# Patient Record
Sex: Male | Born: 1955 | Race: White | Hispanic: No | Marital: Married | Smoking: Never smoker
Health system: Southern US, Community
[De-identification: ages and names within clinical notes are randomized; demographics above are authoritative.]

## PROBLEM LIST (undated history)

## (undated) DIAGNOSIS — I10 Essential (primary) hypertension: Secondary | ICD-10-CM

## (undated) DIAGNOSIS — C44601 Unspecified malignant neoplasm of skin of unspecified upper limb, including shoulder: Secondary | ICD-10-CM

## (undated) DIAGNOSIS — K219 Gastro-esophageal reflux disease without esophagitis: Secondary | ICD-10-CM

## (undated) DIAGNOSIS — E119 Type 2 diabetes mellitus without complications: Secondary | ICD-10-CM

## (undated) DIAGNOSIS — Z955 Presence of coronary angioplasty implant and graft: Secondary | ICD-10-CM

## (undated) DIAGNOSIS — J309 Allergic rhinitis, unspecified: Secondary | ICD-10-CM

## (undated) DIAGNOSIS — E7800 Pure hypercholesterolemia, unspecified: Secondary | ICD-10-CM

## (undated) DIAGNOSIS — T7840XA Allergy, unspecified, initial encounter: Secondary | ICD-10-CM

## (undated) DIAGNOSIS — E785 Hyperlipidemia, unspecified: Secondary | ICD-10-CM

## (undated) DIAGNOSIS — I2510 Atherosclerotic heart disease of native coronary artery without angina pectoris: Secondary | ICD-10-CM

## (undated) DIAGNOSIS — H269 Unspecified cataract: Secondary | ICD-10-CM

## (undated) DIAGNOSIS — I340 Nonrheumatic mitral (valve) insufficiency: Secondary | ICD-10-CM

## (undated) DIAGNOSIS — E559 Vitamin D deficiency, unspecified: Secondary | ICD-10-CM

## (undated) HISTORY — DX: Presence of coronary angioplasty implant and graft: Z95.5

## (undated) HISTORY — DX: Gastro-esophageal reflux disease without esophagitis: K21.9

## (undated) HISTORY — DX: Essential (primary) hypertension: I10

## (undated) HISTORY — DX: Unspecified malignant neoplasm of skin of unspecified upper limb, including shoulder: C44.601

## (undated) HISTORY — DX: Type 2 diabetes mellitus without complications (CMS HCC): E11.9

## (undated) HISTORY — PX: HX VASECTOMY: SHX75

## (undated) HISTORY — PX: CORONARY STENT PLACEMENT: SHX1402

## (undated) HISTORY — DX: Atherosclerotic heart disease of native coronary artery without angina pectoris: I25.10

## (undated) HISTORY — PX: VASECTOMY: SHX75

## (undated) HISTORY — DX: Pure hypercholesterolemia, unspecified: E78.00

## (undated) HISTORY — DX: Unspecified cataract: H26.9

## (undated) HISTORY — DX: Type 2 diabetes mellitus without complications: E11.9

## (undated) HISTORY — DX: Allergic rhinitis, unspecified: J30.9

## (undated) HISTORY — PX: HAMMER TOE SURGERY: SHX385

## (undated) HISTORY — PX: POLYPECTOMY: SHX149

## (undated) HISTORY — DX: Hyperlipidemia, unspecified: E78.5

## (undated) HISTORY — DX: Vitamin D deficiency, unspecified: E55.9

## (undated) HISTORY — DX: Allergy, unspecified, initial encounter: T78.40XA

---
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
## 2000-05-04 ENCOUNTER — Ambulatory Visit (HOSPITAL_COMMUNITY): Admission: RE | Admit: 2000-05-04 | Discharge: 2000-05-04 | Payer: Self-pay | Admitting: Family Medicine

## 2001-09-25 ENCOUNTER — Encounter: Payer: Self-pay | Admitting: Family Medicine

## 2001-09-25 ENCOUNTER — Encounter: Admission: RE | Admit: 2001-09-25 | Discharge: 2001-09-25 | Payer: Self-pay | Admitting: Family Medicine

## 2004-03-27 HISTORY — PX: COLONOSCOPY: SHX174

## 2005-08-02 DIAGNOSIS — E119 Type 2 diabetes mellitus without complications: Secondary | ICD-10-CM

## 2005-08-02 HISTORY — DX: Type 2 diabetes mellitus without complications: E11.9

## 2006-07-19 ENCOUNTER — Encounter: Admission: RE | Admit: 2006-07-19 | Discharge: 2006-10-17 | Payer: Self-pay | Admitting: Family Medicine

## 2007-02-20 ENCOUNTER — Encounter: Admission: RE | Admit: 2007-02-20 | Discharge: 2007-02-20 | Payer: Self-pay | Admitting: Family Medicine

## 2007-04-19 ENCOUNTER — Encounter: Admission: RE | Admit: 2007-04-19 | Discharge: 2007-04-19 | Payer: Self-pay | Admitting: Family Medicine

## 2007-04-20 ENCOUNTER — Encounter: Admission: RE | Admit: 2007-04-20 | Discharge: 2007-04-20 | Payer: Self-pay | Admitting: Family Medicine

## 2009-10-27 DIAGNOSIS — C4492 Squamous cell carcinoma of skin, unspecified: Secondary | ICD-10-CM

## 2009-10-27 HISTORY — DX: Squamous cell carcinoma of skin, unspecified: C44.92

## 2010-11-01 DIAGNOSIS — I2510 Atherosclerotic heart disease of native coronary artery without angina pectoris: Secondary | ICD-10-CM

## 2010-11-01 HISTORY — DX: Atherosclerotic heart disease of native coronary artery without angina pectoris: I25.10

## 2010-11-01 HISTORY — PX: CORONARY ANGIOPLASTY WITH STENT PLACEMENT: SHX49

## 2010-11-05 ENCOUNTER — Ambulatory Visit (HOSPITAL_COMMUNITY)
Admission: RE | Admit: 2010-11-05 | Discharge: 2010-11-06 | Disposition: A | Discharge status: Home / Self Care | Payer: PRIVATE HEALTH INSURANCE | Source: Ambulatory Visit | Attending: Interventional Cardiology | Admitting: Interventional Cardiology

## 2010-11-05 DIAGNOSIS — Z79899 Other long term (current) drug therapy: Secondary | ICD-10-CM | POA: Insufficient documentation

## 2010-11-05 DIAGNOSIS — K219 Gastro-esophageal reflux disease without esophagitis: Secondary | ICD-10-CM | POA: Insufficient documentation

## 2010-11-05 DIAGNOSIS — E7800 Pure hypercholesterolemia, unspecified: Secondary | ICD-10-CM | POA: Insufficient documentation

## 2010-11-05 DIAGNOSIS — E559 Vitamin D deficiency, unspecified: Secondary | ICD-10-CM | POA: Insufficient documentation

## 2010-11-05 DIAGNOSIS — I209 Angina pectoris, unspecified: Secondary | ICD-10-CM | POA: Insufficient documentation

## 2010-11-05 DIAGNOSIS — I10 Essential (primary) hypertension: Secondary | ICD-10-CM | POA: Insufficient documentation

## 2010-11-05 DIAGNOSIS — J309 Allergic rhinitis, unspecified: Secondary | ICD-10-CM | POA: Insufficient documentation

## 2010-11-05 DIAGNOSIS — E119 Type 2 diabetes mellitus without complications: Secondary | ICD-10-CM | POA: Insufficient documentation

## 2010-11-05 DIAGNOSIS — I2510 Atherosclerotic heart disease of native coronary artery without angina pectoris: Secondary | ICD-10-CM | POA: Insufficient documentation

## 2010-11-05 LAB — GLUCOSE, CAPILLARY
Glucose-Capillary: 195 mg/dL — ABNORMAL HIGH (ref 70–99)
Glucose-Capillary: 167 mg/dL — ABNORMAL HIGH (ref 70–99)
Glucose-Capillary: 204 mg/dL — ABNORMAL HIGH (ref 70–99)

## 2010-11-05 LAB — POCT ACTIVATED CLOTTING TIME: Activated Clotting Time: 658 s

## 2010-11-06 LAB — BASIC METABOLIC PANEL
BUN: 11 mg/dL (ref 6–23)
CO2: 27 mEq/L (ref 19–32)
Calcium: 9.1 mg/dL (ref 8.4–10.5)
Chloride: 104 mEq/L (ref 96–112)
Creatinine, Ser: 1.18 mg/dL (ref 0.4–1.5)
GFR calc Af Amer: >60 mL/min (ref >60)

## 2010-11-06 LAB — CBC
Hemoglobin: 15.6 g/dL (ref 13.0–17.0)
MCH: 29.3 pg (ref 26.0–34.0)
MCHC: 34.1 g/dL (ref 30.0–36.0)
MCV: 85.9 fL (ref 78.0–100.0)
Platelets: 156 10*3/uL (ref 150–400)
RBC: 5.32 MIL/uL (ref 4.22–5.81)

## 2010-11-18 NOTE — Discharge Summary (Signed)
  NAME:  Henry Rivera, BOEHNING NO.:  [id number]  MEDICAL RECORD NO.:  [id number]           PATIENT TYPE:  O  LOCATION:  6533                         FACILITY:  [facility]  PHYSICIAN:  Corky Crafts, MDDATE OF BIRTH:  Mar 07, 1956  DATE OF ADMISSION:  11/05/2010 DATE OF DISCHARGE:  11/06/2010                              DISCHARGE SUMMARY   FINAL DIAGNOSES: 1. Coronary artery disease. 2. Hypercholesterolemia. 3. Diabetes.  PROCEDURE PERFORMED:  Cardiac catheterization with drug-eluting stent implantation into the left anterior descending on November 05, 2010, by Dr. Verdis Prime.  HOSPITAL COURSE:  The patient underwent angioplasty of his LAD successfully with a drug-eluting Resolute stent placed into his LAD.  He tolerated the procedure well, was done from his right radial approach. There were no complications.  He was stable the following day and was deemed ready for discharge.  His sugar was somewhat elevated, and he was given a dose of Januvia since he could not take his Janumet.  DISCHARGE MEDICATIONS: 1. Aspirin 325 mg daily. 2. Sublingual nitroglycerin p.r.n. 3. Prasugrel 10 mg daily. 4. Rosuvastatin 20 mg daily. 5. Allegra p.r.n. 6. Benicar 20 mg daily. 7. Keflex 1 capsule b.i.d. 8. Janumet 50/1000 b.i.d., resume on November 07, 2010. 9. Nexium 40 mg daily. 10.Vitamin D.  Follow up with Dr. Katrinka Blazing in 7-10 days.  DIET:  Low-sodium, heart-healthy diet.  ACTIVITY:  Follow up post radial cath instructions, increase activity slowly.     Corky Crafts, MD     JSV/MEDQ  D:  11/06/2010  T:  11/07/2010  Job:  [id number]  Electronically Signed by Lance Muss MD on 11/18/2010 02:27:15 PM

## 2010-11-20 NOTE — Cardiovascular Report (Signed)
NAME:  Henry Rivera, Henry Rivera NO.:  [id number]  MEDICAL RECORD NO.:  [id number]           PATIENT TYPE:  O  LOCATION:  6533                         FACILITY:  [facility]  PHYSICIAN:  Lyn Records, M.D.   DATE OF BIRTH:  [birth year]  DATE OF PROCEDURE:  11/05/2010 DATE OF DISCHARGE:                           CARDIAC CATHETERIZATION   INDICATION:  Class II/III angina with abnormal Cardiolite study suggesting anterior ischemia.  PROCEDURES PERFORMED: 1. Left heart cath via right radial approach. 2. Left ventriculography. 3. Coronary angiography. 4. Drug-eluting stent, Resolute, LAD.  DESCRIPTION:  The patient had given fully informed consent prior to coming to the cath lab.  We preferred to use a right radial approach. He was sterilely prepped and draped.  A 5-French sheath was placed without difficulty using a micro puncture set.  Verapamil was then given intra-arterially.  5000 units of heparin was then administered.  We performed left heart catheterization using a 5-French A2 multipurpose catheter.  We were able to cannulate the right coronary and do the left ventriculography with the multipurpose.  A 5-French 3.5 left Judkins catheter was used for left coronary angiography.  After identifying high- grade disease in the LAD, we decided to proceed with stenting.  We stayed with a 5-French system.  We used a 3.5 EBU left coronary system guide catheter.  We gave a bolus followed by an infusion of Angiomax.  60 mg of Effient was given.  We used a BMW wire to direct stent the proximal LAD lesion with a 3.5 x 18-mm long Resolute stent. We deployed the stent to 17 atmospheres, which was nearly 4 mm in diameter.  We then postdilated with a 12-mm long x 4.0-mm [place] Housing balloon to 14 atmospheres.  We then upgraded to a 4.5 x 12-mm long [place] Quantum balloon and postdilated to 12 atmospheres.  No complications occurred during the procedure.  Intracoronary nitroglycerin was administered  and post procedure images were obtained.  A wristband was applied to the right radial with good hemostasis.  No complications occurred.  RESULTS: 1. Hemodynamic data:     a.     Left ventricular pressure 113/16.     b.     Aortic pressure 114/84. 2. Left ventriculography:  Left ventricular cavity size and function     are normal.  EF is 60%. 3. Coronary angiography:     a.     Left main coronary:  Widely patent.     b.     Left anterior descending coronary:  Proximal somewhat      concentric 85% stenosis in the LAD with TIMI grade 2 flow beyond      large distribution is supplied.  The LAD is transapical.     c.     Circumflex artery:  Large dominant vessel giving 3 obtuse      marginal branches.     d.     Right coronary:  Large, dominant, no significant obstruction      is noted. 4. PCI:  LAD 85% proximal stenosis reduced to 0% with TIMI grade 2.5     to 3 flow.  Postdilated to  4.5 mm in diameter.  CONCLUSIONS: 1. Coronary atherosclerosis with high-grade proximal LAD stenosis     producing an 85% lesion reduced to 0% with TIMI grade 3 flow. 2. Widely patent circumflex and right coronary. 3. Normal LV function.  PLAN:  Aspirin and Effient indefinitely given location of the stent. Hopeful discharge in the a.m.     Lyn Records, M.D.     HWS/MEDQ  D:  11/05/2010  T:  11/06/2010  Job:  [id number]  cc:   Thayer Headings, M.D.  Electronically Signed by Verdis Prime M.D. on 11/20/2010 04:57:33 PM

## 2010-11-23 ENCOUNTER — Other Ambulatory Visit: Payer: Self-pay | Admitting: Interventional Cardiology

## 2010-11-23 ENCOUNTER — Encounter (HOSPITAL_COMMUNITY): Payer: PRIVATE HEALTH INSURANCE | Attending: Interventional Cardiology

## 2010-11-23 DIAGNOSIS — E7800 Pure hypercholesterolemia, unspecified: Secondary | ICD-10-CM | POA: Insufficient documentation

## 2010-11-23 DIAGNOSIS — Z9861 Coronary angioplasty status: Secondary | ICD-10-CM | POA: Insufficient documentation

## 2010-11-23 DIAGNOSIS — E119 Type 2 diabetes mellitus without complications: Secondary | ICD-10-CM | POA: Insufficient documentation

## 2010-11-23 DIAGNOSIS — I209 Angina pectoris, unspecified: Secondary | ICD-10-CM | POA: Insufficient documentation

## 2010-11-23 DIAGNOSIS — I10 Essential (primary) hypertension: Secondary | ICD-10-CM | POA: Insufficient documentation

## 2010-11-23 DIAGNOSIS — I2510 Atherosclerotic heart disease of native coronary artery without angina pectoris: Secondary | ICD-10-CM | POA: Insufficient documentation

## 2010-11-23 DIAGNOSIS — K219 Gastro-esophageal reflux disease without esophagitis: Secondary | ICD-10-CM | POA: Insufficient documentation

## 2010-11-23 DIAGNOSIS — Z79899 Other long term (current) drug therapy: Secondary | ICD-10-CM | POA: Insufficient documentation

## 2010-11-23 DIAGNOSIS — Z5189 Encounter for other specified aftercare: Secondary | ICD-10-CM | POA: Insufficient documentation

## 2010-11-23 DIAGNOSIS — E559 Vitamin D deficiency, unspecified: Secondary | ICD-10-CM | POA: Insufficient documentation

## 2010-11-25 ENCOUNTER — Other Ambulatory Visit: Payer: Self-pay | Admitting: Interventional Cardiology

## 2010-11-25 ENCOUNTER — Encounter (HOSPITAL_COMMUNITY): Payer: PRIVATE HEALTH INSURANCE

## 2010-11-25 LAB — GLUCOSE, CAPILLARY
Glucose-Capillary: 286 mg/dL — ABNORMAL HIGH (ref 70–99)
Glucose-Capillary: 191 mg/dL — ABNORMAL HIGH (ref 70–99)

## 2010-11-27 ENCOUNTER — Other Ambulatory Visit: Payer: Self-pay | Admitting: Interventional Cardiology

## 2010-11-27 ENCOUNTER — Encounter (HOSPITAL_COMMUNITY): Payer: PRIVATE HEALTH INSURANCE

## 2010-11-27 LAB — GLUCOSE, CAPILLARY: Glucose-Capillary: 144 mg/dL — ABNORMAL HIGH (ref 70–99)

## 2010-11-30 ENCOUNTER — Other Ambulatory Visit: Payer: Self-pay | Admitting: Interventional Cardiology

## 2010-11-30 ENCOUNTER — Encounter (HOSPITAL_COMMUNITY): Payer: PRIVATE HEALTH INSURANCE

## 2010-11-30 LAB — GLUCOSE, CAPILLARY
Glucose-Capillary: 202 mg/dL — ABNORMAL HIGH (ref 70–99)
Glucose-Capillary: 151 mg/dL — ABNORMAL HIGH (ref 70–99)

## 2010-12-02 ENCOUNTER — Encounter (HOSPITAL_COMMUNITY): Payer: PRIVATE HEALTH INSURANCE | Attending: Interventional Cardiology

## 2010-12-02 DIAGNOSIS — Z79899 Other long term (current) drug therapy: Secondary | ICD-10-CM | POA: Insufficient documentation

## 2010-12-02 DIAGNOSIS — Z5189 Encounter for other specified aftercare: Secondary | ICD-10-CM | POA: Insufficient documentation

## 2010-12-02 DIAGNOSIS — E7800 Pure hypercholesterolemia, unspecified: Secondary | ICD-10-CM | POA: Insufficient documentation

## 2010-12-02 DIAGNOSIS — I2510 Atherosclerotic heart disease of native coronary artery without angina pectoris: Secondary | ICD-10-CM | POA: Insufficient documentation

## 2010-12-02 DIAGNOSIS — E119 Type 2 diabetes mellitus without complications: Secondary | ICD-10-CM | POA: Insufficient documentation

## 2010-12-02 DIAGNOSIS — K219 Gastro-esophageal reflux disease without esophagitis: Secondary | ICD-10-CM | POA: Insufficient documentation

## 2010-12-02 DIAGNOSIS — Z9861 Coronary angioplasty status: Secondary | ICD-10-CM | POA: Insufficient documentation

## 2010-12-02 DIAGNOSIS — E559 Vitamin D deficiency, unspecified: Secondary | ICD-10-CM | POA: Insufficient documentation

## 2010-12-02 DIAGNOSIS — I209 Angina pectoris, unspecified: Secondary | ICD-10-CM | POA: Insufficient documentation

## 2010-12-02 DIAGNOSIS — I10 Essential (primary) hypertension: Secondary | ICD-10-CM | POA: Insufficient documentation

## 2010-12-04 ENCOUNTER — Encounter (HOSPITAL_COMMUNITY): Payer: PRIVATE HEALTH INSURANCE

## 2010-12-07 ENCOUNTER — Encounter (HOSPITAL_COMMUNITY): Payer: PRIVATE HEALTH INSURANCE

## 2010-12-09 ENCOUNTER — Encounter (HOSPITAL_COMMUNITY): Payer: PRIVATE HEALTH INSURANCE

## 2010-12-11 ENCOUNTER — Encounter (HOSPITAL_COMMUNITY): Payer: PRIVATE HEALTH INSURANCE

## 2010-12-14 ENCOUNTER — Encounter (HOSPITAL_COMMUNITY): Payer: PRIVATE HEALTH INSURANCE

## 2010-12-16 ENCOUNTER — Encounter (HOSPITAL_COMMUNITY): Payer: PRIVATE HEALTH INSURANCE

## 2010-12-18 ENCOUNTER — Encounter (HOSPITAL_COMMUNITY): Payer: PRIVATE HEALTH INSURANCE

## 2010-12-21 ENCOUNTER — Encounter (HOSPITAL_COMMUNITY): Payer: PRIVATE HEALTH INSURANCE

## 2010-12-23 ENCOUNTER — Encounter (HOSPITAL_COMMUNITY): Payer: PRIVATE HEALTH INSURANCE

## 2010-12-25 ENCOUNTER — Encounter (HOSPITAL_COMMUNITY): Payer: PRIVATE HEALTH INSURANCE

## 2010-12-28 ENCOUNTER — Encounter (HOSPITAL_COMMUNITY): Payer: PRIVATE HEALTH INSURANCE

## 2010-12-30 ENCOUNTER — Encounter (HOSPITAL_COMMUNITY): Payer: PRIVATE HEALTH INSURANCE

## 2011-01-01 ENCOUNTER — Other Ambulatory Visit: Payer: Self-pay | Admitting: Interventional Cardiology

## 2011-01-01 ENCOUNTER — Encounter (HOSPITAL_COMMUNITY): Payer: PRIVATE HEALTH INSURANCE | Attending: Interventional Cardiology

## 2011-01-01 DIAGNOSIS — E119 Type 2 diabetes mellitus without complications: Secondary | ICD-10-CM | POA: Insufficient documentation

## 2011-01-01 DIAGNOSIS — Z5189 Encounter for other specified aftercare: Secondary | ICD-10-CM | POA: Insufficient documentation

## 2011-01-01 DIAGNOSIS — E7800 Pure hypercholesterolemia, unspecified: Secondary | ICD-10-CM | POA: Insufficient documentation

## 2011-01-01 DIAGNOSIS — Z9861 Coronary angioplasty status: Secondary | ICD-10-CM | POA: Insufficient documentation

## 2011-01-01 DIAGNOSIS — Z79899 Other long term (current) drug therapy: Secondary | ICD-10-CM | POA: Insufficient documentation

## 2011-01-01 DIAGNOSIS — I209 Angina pectoris, unspecified: Secondary | ICD-10-CM | POA: Insufficient documentation

## 2011-01-01 DIAGNOSIS — K219 Gastro-esophageal reflux disease without esophagitis: Secondary | ICD-10-CM | POA: Insufficient documentation

## 2011-01-01 DIAGNOSIS — I2510 Atherosclerotic heart disease of native coronary artery without angina pectoris: Secondary | ICD-10-CM | POA: Insufficient documentation

## 2011-01-01 DIAGNOSIS — I10 Essential (primary) hypertension: Secondary | ICD-10-CM | POA: Insufficient documentation

## 2011-01-01 DIAGNOSIS — E559 Vitamin D deficiency, unspecified: Secondary | ICD-10-CM | POA: Insufficient documentation

## 2011-01-04 ENCOUNTER — Encounter (HOSPITAL_COMMUNITY): Payer: PRIVATE HEALTH INSURANCE

## 2011-01-06 ENCOUNTER — Encounter (HOSPITAL_COMMUNITY): Payer: PRIVATE HEALTH INSURANCE

## 2011-01-08 ENCOUNTER — Encounter (HOSPITAL_COMMUNITY): Payer: PRIVATE HEALTH INSURANCE

## 2011-01-11 ENCOUNTER — Encounter (HOSPITAL_COMMUNITY): Payer: PRIVATE HEALTH INSURANCE

## 2011-01-13 ENCOUNTER — Encounter (HOSPITAL_COMMUNITY): Payer: PRIVATE HEALTH INSURANCE

## 2011-01-15 ENCOUNTER — Encounter (HOSPITAL_COMMUNITY): Payer: PRIVATE HEALTH INSURANCE

## 2011-01-18 ENCOUNTER — Other Ambulatory Visit: Payer: Self-pay | Admitting: Interventional Cardiology

## 2011-01-18 ENCOUNTER — Encounter (HOSPITAL_COMMUNITY): Payer: PRIVATE HEALTH INSURANCE

## 2011-01-20 ENCOUNTER — Encounter (HOSPITAL_COMMUNITY): Payer: PRIVATE HEALTH INSURANCE

## 2011-01-20 ENCOUNTER — Other Ambulatory Visit: Payer: Self-pay | Admitting: Interventional Cardiology

## 2011-01-22 ENCOUNTER — Encounter (HOSPITAL_COMMUNITY): Payer: PRIVATE HEALTH INSURANCE

## 2011-01-25 ENCOUNTER — Encounter (HOSPITAL_COMMUNITY): Payer: PRIVATE HEALTH INSURANCE

## 2011-01-27 ENCOUNTER — Encounter (HOSPITAL_COMMUNITY): Payer: PRIVATE HEALTH INSURANCE

## 2011-01-29 ENCOUNTER — Encounter (HOSPITAL_COMMUNITY): Payer: PRIVATE HEALTH INSURANCE

## 2011-02-01 ENCOUNTER — Encounter (HOSPITAL_COMMUNITY): Payer: PRIVATE HEALTH INSURANCE | Attending: Interventional Cardiology

## 2011-02-01 DIAGNOSIS — E7800 Pure hypercholesterolemia, unspecified: Secondary | ICD-10-CM | POA: Insufficient documentation

## 2011-02-01 DIAGNOSIS — E119 Type 2 diabetes mellitus without complications: Secondary | ICD-10-CM | POA: Insufficient documentation

## 2011-02-01 DIAGNOSIS — Z9861 Coronary angioplasty status: Secondary | ICD-10-CM | POA: Insufficient documentation

## 2011-02-01 DIAGNOSIS — I209 Angina pectoris, unspecified: Secondary | ICD-10-CM | POA: Insufficient documentation

## 2011-02-01 DIAGNOSIS — I10 Essential (primary) hypertension: Secondary | ICD-10-CM | POA: Insufficient documentation

## 2011-02-01 DIAGNOSIS — Z79899 Other long term (current) drug therapy: Secondary | ICD-10-CM | POA: Insufficient documentation

## 2011-02-01 DIAGNOSIS — Z5189 Encounter for other specified aftercare: Secondary | ICD-10-CM | POA: Insufficient documentation

## 2011-02-01 DIAGNOSIS — I2510 Atherosclerotic heart disease of native coronary artery without angina pectoris: Secondary | ICD-10-CM | POA: Insufficient documentation

## 2011-02-01 DIAGNOSIS — K219 Gastro-esophageal reflux disease without esophagitis: Secondary | ICD-10-CM | POA: Insufficient documentation

## 2011-02-01 DIAGNOSIS — E559 Vitamin D deficiency, unspecified: Secondary | ICD-10-CM | POA: Insufficient documentation

## 2011-02-03 ENCOUNTER — Encounter (HOSPITAL_COMMUNITY): Payer: PRIVATE HEALTH INSURANCE

## 2011-02-05 ENCOUNTER — Encounter (HOSPITAL_COMMUNITY): Payer: PRIVATE HEALTH INSURANCE

## 2011-02-08 ENCOUNTER — Encounter (HOSPITAL_COMMUNITY): Payer: PRIVATE HEALTH INSURANCE

## 2011-02-10 ENCOUNTER — Encounter (HOSPITAL_COMMUNITY): Payer: PRIVATE HEALTH INSURANCE

## 2011-02-12 ENCOUNTER — Other Ambulatory Visit: Payer: Self-pay | Admitting: Interventional Cardiology

## 2011-02-12 ENCOUNTER — Encounter (HOSPITAL_COMMUNITY): Payer: PRIVATE HEALTH INSURANCE

## 2011-02-15 ENCOUNTER — Encounter (HOSPITAL_COMMUNITY): Payer: PRIVATE HEALTH INSURANCE

## 2011-02-15 LAB — GLUCOSE, CAPILLARY: Glucose-Capillary: 119 mg/dL — ABNORMAL HIGH (ref 70–99)

## 2011-02-17 ENCOUNTER — Encounter (HOSPITAL_COMMUNITY): Payer: PRIVATE HEALTH INSURANCE

## 2011-02-19 ENCOUNTER — Encounter (HOSPITAL_COMMUNITY): Payer: PRIVATE HEALTH INSURANCE

## 2011-02-22 ENCOUNTER — Encounter (HOSPITAL_COMMUNITY): Payer: PRIVATE HEALTH INSURANCE

## 2011-02-24 ENCOUNTER — Encounter (HOSPITAL_COMMUNITY): Payer: PRIVATE HEALTH INSURANCE

## 2011-02-26 ENCOUNTER — Encounter (HOSPITAL_COMMUNITY): Payer: PRIVATE HEALTH INSURANCE

## 2013-06-01 ENCOUNTER — Telehealth: Payer: Self-pay | Admitting: *Deleted

## 2013-06-01 NOTE — Telephone Encounter (Signed)
error 

## 2013-12-18 ENCOUNTER — Encounter: Payer: Self-pay | Admitting: Interventional Cardiology

## 2014-01-29 ENCOUNTER — Ambulatory Visit: Payer: PRIVATE HEALTH INSURANCE | Admitting: Interventional Cardiology

## 2014-02-21 ENCOUNTER — Encounter: Payer: Self-pay | Admitting: Interventional Cardiology

## 2014-02-21 ENCOUNTER — Ambulatory Visit (INDEPENDENT_AMBULATORY_CARE_PROVIDER_SITE_OTHER): Payer: 59 | Admitting: Interventional Cardiology

## 2014-02-21 VITALS — BP 124/76 | HR 67 | Ht 77.0 in | Wt 275.0 lb

## 2014-02-21 DIAGNOSIS — E785 Hyperlipidemia, unspecified: Secondary | ICD-10-CM

## 2014-02-21 DIAGNOSIS — I10 Essential (primary) hypertension: Secondary | ICD-10-CM | POA: Insufficient documentation

## 2014-02-21 DIAGNOSIS — I2510 Atherosclerotic heart disease of native coronary artery without angina pectoris: Secondary | ICD-10-CM | POA: Insufficient documentation

## 2014-02-21 DIAGNOSIS — E118 Type 2 diabetes mellitus with unspecified complications: Secondary | ICD-10-CM | POA: Insufficient documentation

## 2014-02-21 NOTE — Patient Instructions (Signed)
Your physician recommends that you continue on your current medications as directed. Please refer to the Current Medication list given to you today.  Your physician wants you to follow-up in: 1 year. You will receive a reminder letter in the mail two months in advance. If you don't receive a letter, please call our office to schedule the follow-up appointment.  

## 2014-02-21 NOTE — Progress Notes (Signed)
Patient ID: Henry Rivera, male   DOB: 1956-07-10, 58 y.o.   MRN: [id number]    [address], [address] [place], [place]  [address] Phone: [phone] Fax:  [phone]  Date:  02/21/2014   ID:  Henry Rivera, DOB 16-Apr-1956, MRN [id number]  PCP:  No primary provider on file.   ASSESSMENT:  1. Coronary atherosclerosis with prior LAD stent. Asymptomatic with reference to angina 2. Hypertension, controlled 3. Diabetes mellitus, poorly controlled, type II 4. Hyperlipidemia followed by his primary care, Dr. Aline August  PLAN:  1. Weight loss 2. Aerobic activity 3. Clinical followup with me in one year 4. Call if chest discomfort or unexplained dyspnea 5. No change in medical regimen   SUBJECTIVE: Henry Rivera is a 58 y.o. male the past there is doing well. He has had no recent episodes of angina. He is remaining active. He has occasional low back discomfort that limits his activity. He walks up to 3 miles continuously several times per week. He has not needed nitroglycerin.   Wt Readings from Last 3 Encounters:  02/21/14 275 lb (124.739 kg)     Past Medical History  Diagnosis Date  . Diabetes 2007  . Hypertension   . GERD (gastroesophageal reflux disease)   . Allergic rhinitis   . Vitamin D deficiency   . Hyperlipidemia   . CAD (coronary artery disease) 4/12    WITH LAD DES   . Elevated cholesterol     Current Outpatient Prescriptions  Medication Sig Dispense Refill  . aspirin 81 MG tablet Take 81 mg by mouth daily.      . Cholecalciferol (VITAMIN D PO) Take by mouth. 1000 units once daily      . esomeprazole (NEXIUM) 40 MG capsule Take 40 mg by mouth daily at 12 noon.      . fexofenadine (ALLEGRA) 180 MG tablet Take 180 mg by mouth daily.      . fluticasone (FLONASE) 50 MCG/ACT nasal spray Place 2 sprays into both nostrils as needed for allergies or rhinitis.      Marland Kitchen irbesartan (AVAPRO) 300 MG tablet Take 300 mg by mouth daily.      . nitroGLYCERIN  (NITROSTAT) 0.4 MG SL tablet Place 0.4 mg under the tongue every 5 (five) minutes as needed for chest pain.      . rosuvastatin (CRESTOR) 5 MG tablet Take 5 mg by mouth daily.      . sitaGLIPtin-metformin (JANUMET) 50-1000 MG per tablet Take 1 tablet by mouth 2 (two) times daily with a meal.       No current facility-administered medications for this visit.    Allergies:    Allergies  Allergen Reactions  . Augmentin [Amoxicillin-Pot Clavulanate]     Gi sx    Social History:  The patient  reports that he has never smoked. He does not have any smokeless tobacco history on file.   ROS:  Please see the history of present illness.   No edema. No palpitations or syncope.   All other systems reviewed and negative.   OBJECTIVE: VS:  BP 124/76  Pulse 67  Ht 6\' 5"  (1.956 m)  Wt 275 lb (124.739 kg)  BMI 32.60 kg/m2 Well nourished, well developed, in no acute distress, obese HEENT: normal Neck: JVD flat. Carotid bruit absent  Cardiac:  normal S1, S2; RRR; no murmur Lungs:  clear to auscultation bilaterally, no wheezing, rhonchi or rales Abd: soft, nontender, no hepatomegaly Ext: Edema absent. Pulses 2+  Skin: warm and dry Neuro:  CNs 2-12 intact, no focal abnormalities noted  EKG:  Normal sinus rhythm with inferior T-wave abnormality but otherwise normal no change compared to prior tracings       Signed, Illene Labrador III, MD 02/21/2014 10:18 AM

## 2015-02-07 ENCOUNTER — Other Ambulatory Visit: Payer: Self-pay | Admitting: *Deleted

## 2015-02-07 MED ORDER — NITROGLYCERIN 0.4 MG SL SUBL: 0.4000 mg | SUBLINGUAL_TABLET | SUBLINGUAL | Status: DC | PRN

## 2015-03-19 DIAGNOSIS — C4491 Basal cell carcinoma of skin, unspecified: Secondary | ICD-10-CM

## 2015-03-19 HISTORY — DX: Basal cell carcinoma of skin, unspecified: C44.91

## 2015-04-01 NOTE — Progress Notes (Signed)
Cardiology Office Note   Date:  04/02/2015   ID:  NIVAAN DICENZO, DOB 07-19-1956, MRN [id number]  PCP:  Thressa Sheller, MD  Cardiologist:  Sinclair Grooms, MD   Chief Complaint  Patient presents with  . Coronary Artery Disease      History of Present Illness: Henry Rivera is a 59 y.o. male who presents for CAD with LAD DES, DM II, hyperlipidemia, and essential hypertension.  Doing well without any cardiac complaints. Denies chest pain. No dyspnea. Has been physically very active without limitations. He has gained some weight. History on exercise.  Past Medical History  Diagnosis Date  . Diabetes 2007  . Hypertension   . GERD (gastroesophageal reflux disease)   . Allergic rhinitis   . Vitamin D deficiency   . Hyperlipidemia   . CAD (coronary artery disease) 4/12    WITH LAD DES   . Elevated cholesterol     Past Surgical History  Procedure Laterality Date  . Vasectomy       Current Outpatient Prescriptions  Medication Sig Dispense Refill  . aspirin 81 MG tablet Take 81 mg by mouth daily.    . Cholecalciferol (VITAMIN D PO) Take by mouth. 1000 units once daily    . esomeprazole (NEXIUM) 40 MG capsule Take 40 mg by mouth daily at 12 noon.    . fexofenadine (ALLEGRA) 180 MG tablet Take 180 mg by mouth daily.    . fluticasone (FLONASE) 50 MCG/ACT nasal spray Place 2 sprays into both nostrils as needed for allergies or rhinitis.    Marland Kitchen irbesartan (AVAPRO) 300 MG tablet Take 300 mg by mouth daily.    . nitroGLYCERIN (NITROSTAT) 0.4 MG SL tablet Place 1 tablet (0.4 mg total) under the tongue every 5 (five) minutes as needed for chest pain. 30 tablet 5  . rosuvastatin (CRESTOR) 5 MG tablet TAKE 1 TABLET EVERY OTHER DAY ORALLY  1  . sitaGLIPtin-metformin (JANUMET) 50-1000 MG per tablet Take 1 tablet by mouth 2 (two) times daily with a meal.     No current facility-administered medications for this visit.    Allergies:   Augmentin    Social History:  The  patient  reports that he has never smoked. He does not have any smokeless tobacco history on file.   Family History:  The patient's family history includes Diabetes in his paternal grandfather; Heart failure in his paternal grandfather; Hepatitis in his brother.    ROS:  Please see the history of present illness.   Otherwise, review of systems are positive for right knee discomfort and snoring. He denies excessive daytime sleepiness..   All other systems are reviewed and negative.    PHYSICAL EXAM: VS:  BP 112/80 mmHg  Pulse 66  Ht 6\' 5"  (1.956 m)  Wt 122.925 kg (271 lb)  BMI 32.13 kg/m2 , BMI Body mass index is 32.13 kg/(m^2). GEN: Well nourished, well developed, in no acute distress HEENT: normal Neck: no JVD, faint left carotid bruit. Cardiac: RRR; no murmurs, rubs, or gallops,no edema  Respiratory:  clear to auscultation bilaterally, normal work of breathing GI: soft, nontender, nondistended, + BS MS: no deformity or atrophy Skin: warm and dry, no rash Neuro:  Strength and sensation are intact Psych: euthymic mood, full affect   EKG:  EKG is ordered today. The ekg ordered today demonstrates normal tracing with normal sinus rhythm   Recent Labs: No results found for requested labs within last 365 days.    Lipid Panel  No results found for: CHOL, TRIG, HDL, CHOLHDL, VLDL, LDLCALC, LDLDIRECT    Wt Readings from Last 3 Encounters:  04/02/15 122.925 kg (271 lb)  02/21/14 124.739 kg (275 lb)      Other studies Reviewed: Additional studies/ records that were reviewed today include: . Review of the above records demonstrates:   Echocardiogram performed today.   ASSESSMENT AND PLAN:  1. Coronary artery disease involving native coronary artery of native heart without angina pectoris Asymptomatic  2. Essential hypertension Controlled. Echo is been done today to rule out LVH  3. Hyperlipidemia On therapy  4. Faint left carotid bruit We need to screen for carotid  disease given known history of CAD and other risk factors.    Current medicines are reviewed at length with the patient today.  The patient does not have concerns regarding medicines.  The following changes have been made:  no change  Labs/ tests ordered today include:   Orders Placed This Encounter  Procedures  . EKG 12-Lead     Disposition:   FU with HS in 1 year  Signed, Sinclair Grooms, MD  04/02/2015 9:07 AM    [facility] [place], [place], [place]  [address] Phone: [phone]; Fax: [phone]

## 2015-04-02 ENCOUNTER — Ambulatory Visit (INDEPENDENT_AMBULATORY_CARE_PROVIDER_SITE_OTHER): Payer: 59 | Admitting: Interventional Cardiology

## 2015-04-02 ENCOUNTER — Other Ambulatory Visit: Payer: Self-pay

## 2015-04-02 ENCOUNTER — Other Ambulatory Visit: Payer: Self-pay | Admitting: Internal Medicine

## 2015-04-02 ENCOUNTER — Encounter: Payer: Self-pay | Admitting: Interventional Cardiology

## 2015-04-02 ENCOUNTER — Ambulatory Visit (HOSPITAL_COMMUNITY): Payer: 59 | Attending: Cardiovascular Disease

## 2015-04-02 VITALS — BP 112/80 | HR 66 | Ht 77.0 in | Wt 271.0 lb

## 2015-04-02 DIAGNOSIS — E119 Type 2 diabetes mellitus without complications: Secondary | ICD-10-CM | POA: Insufficient documentation

## 2015-04-02 DIAGNOSIS — I509 Heart failure, unspecified: Secondary | ICD-10-CM | POA: Diagnosis not present

## 2015-04-02 DIAGNOSIS — I2510 Atherosclerotic heart disease of native coronary artery without angina pectoris: Secondary | ICD-10-CM

## 2015-04-02 DIAGNOSIS — I10 Essential (primary) hypertension: Secondary | ICD-10-CM

## 2015-04-02 DIAGNOSIS — R0989 Other specified symptoms and signs involving the circulatory and respiratory systems: Secondary | ICD-10-CM

## 2015-04-02 DIAGNOSIS — I517 Cardiomegaly: Secondary | ICD-10-CM | POA: Insufficient documentation

## 2015-04-02 DIAGNOSIS — E785 Hyperlipidemia, unspecified: Secondary | ICD-10-CM | POA: Insufficient documentation

## 2015-04-02 DIAGNOSIS — Z8249 Family history of ischemic heart disease and other diseases of the circulatory system: Secondary | ICD-10-CM | POA: Insufficient documentation

## 2015-04-02 NOTE — Patient Instructions (Signed)
Medication Instructions:  Your physician recommends that you continue on your current medications as directed. Please refer to the Current Medication list given to you today.   Labwork: None ordered  Testing/Procedures: Your physician has requested that you have a carotid duplex. This test is an ultrasound of the carotid arteries in your neck. It looks at blood flow through these arteries that supply the brain with blood. Allow one hour for this exam. There are no restrictions or special instructions.    Follow-Up: Your physician wants you to follow-up in: 1 year with Dr.Smith You will receive a reminder letter in the mail two months in advance. If you don't receive a letter, please call our office to schedule the follow-up appointment.   Any Other Special Instructions Will Be Listed Below (If Applicable).

## 2015-04-09 ENCOUNTER — Ambulatory Visit (HOSPITAL_COMMUNITY)
Admission: RE | Admit: 2015-04-09 | Discharge: 2015-04-09 | Disposition: A | Discharge status: Home / Self Care | Payer: 59 | Source: Ambulatory Visit | Attending: Cardiovascular Disease | Admitting: Cardiovascular Disease

## 2015-04-09 DIAGNOSIS — R0989 Other specified symptoms and signs involving the circulatory and respiratory systems: Secondary | ICD-10-CM | POA: Insufficient documentation

## 2015-04-17 ENCOUNTER — Telehealth: Payer: Self-pay | Admitting: Interventional Cardiology

## 2015-04-17 NOTE — Telephone Encounter (Signed)
New message     Pt calling back in regards to test results Please call to discuss

## 2015-04-17 NOTE — Telephone Encounter (Signed)
-----   Message from Belva Crome, MD sent at 04/11/2015  6:26 PM EDT ----- Normal, therefore no worries.

## 2015-04-17 NOTE — Telephone Encounter (Signed)
Pt aware of carotid results with verbal understanding.  Pt sts that he had an echo in Aug and never received results. Echo was not ordered by Dr.Smith. Adv pt I will fwd Dr.Smith a message to review pt's echo, and we will call back with results. Pt verbalized understanding.

## 2015-04-20 NOTE — Telephone Encounter (Signed)
Echo is without significant abnormality. Mild left atrial enlargement and mild LVH. The latter to require excellent blood pressure control to prevent getting worse.

## 2015-04-22 NOTE — Telephone Encounter (Signed)
Pt rqst a letter stating results and Dr.Smith's interpretation on pt's echo. Letter left at the front desk for pick up. Pt verbalized understanding.

## 2015-04-22 NOTE — Telephone Encounter (Signed)
Pt aware of echo results with verbal understanding. 

## 2015-10-02 ENCOUNTER — Encounter: Payer: Self-pay | Admitting: Gastroenterology

## 2015-11-11 ENCOUNTER — Ambulatory Visit (AMBULATORY_SURGERY_CENTER): Payer: Self-pay | Admitting: *Deleted

## 2015-11-11 VITALS — Ht 77.0 in | Wt 275.0 lb

## 2015-11-11 DIAGNOSIS — Z1211 Encounter for screening for malignant neoplasm of colon: Secondary | ICD-10-CM

## 2015-11-11 NOTE — Progress Notes (Signed)
No egg or soy allergy known to patient  No issues with past sedation with any surgeries  or procedures, no intubation problems  No diet pills per patient No home 02 use per patient  No blood thinners per patient  Pt denies issues with constipation   

## 2015-11-25 ENCOUNTER — Ambulatory Visit (AMBULATORY_SURGERY_CENTER): Payer: Commercial Managed Care - PPO | Admitting: Gastroenterology

## 2015-11-25 ENCOUNTER — Encounter: Payer: Self-pay | Admitting: Gastroenterology

## 2015-11-25 VITALS — BP 110/68 | HR 67 | Temp 96.8°F | Resp 11 | Ht 77.0 in | Wt 275.0 lb

## 2015-11-25 DIAGNOSIS — Z1211 Encounter for screening for malignant neoplasm of colon: Secondary | ICD-10-CM

## 2015-11-25 DIAGNOSIS — D128 Benign neoplasm of rectum: Secondary | ICD-10-CM

## 2015-11-25 DIAGNOSIS — D127 Benign neoplasm of rectosigmoid junction: Secondary | ICD-10-CM

## 2015-11-25 DIAGNOSIS — D120 Benign neoplasm of cecum: Secondary | ICD-10-CM

## 2015-11-25 DIAGNOSIS — D129 Benign neoplasm of anus and anal canal: Secondary | ICD-10-CM

## 2015-11-25 DIAGNOSIS — D122 Benign neoplasm of ascending colon: Secondary | ICD-10-CM | POA: Diagnosis not present

## 2015-11-25 LAB — GLUCOSE, CAPILLARY
GLUCOSE-CAPILLARY: 121 mg/dL — AB (ref 65–99)
Glucose-Capillary: 126 mg/dL — ABNORMAL HIGH (ref 65–99)

## 2015-11-25 MED ORDER — SODIUM CHLORIDE 0.9 % IV SOLN: 500.0000 mL | INTRAVENOUS | Status: DC

## 2015-11-25 NOTE — Op Note (Signed)
[place] Patient Name: Henry Rivera Procedure Date: 11/25/2015 8:05 AM MRN: [id number] Endoscopist: Remo Lipps P. Havery Moros , MD Age: 60 Date of Birth: 04-Nov-1955 Gender: Male Procedure:                Colonoscopy Indications:              Screening for colorectal malignant neoplasm Medicines:                Monitored Anesthesia Care Procedure:                Pre-Anesthesia Assessment:                           - Prior to the procedure, a History and Physical                            was performed, and patient medications and                            allergies were reviewed. The patient's tolerance of                            previous anesthesia was also reviewed. The risks                            and benefits of the procedure and the sedation                            options and risks were discussed with the patient.                            All questions were answered, and informed consent                            was obtained. Prior Anticoagulants: The patient has                            taken aspirin, last dose was 1 day prior to                            procedure. ASA Grade Assessment: III - A patient                            with severe systemic disease. After reviewing the                            risks and benefits, the patient was deemed in                            satisfactory condition to undergo the procedure.                           After obtaining informed consent, the colonoscope  was passed under direct vision. Throughout the                            procedure, the patient's blood pressure, pulse, and                            oxygen saturations were monitored continuously. The                            Model CF-HQ190L [id number]) scope was introduced                            through the anus and advanced to the the cecum,                            identified by appendiceal orifice and ileocecal                             valve. The colonoscopy was performed without                            difficulty. The patient tolerated the procedure                            well. The quality of the bowel preparation was                            adequate. The ileocecal valve, appendiceal orifice,                            and rectum were photographed. Scope In: 8:15:41 AM Scope Out: 8:33:25 AM Scope Withdrawal Time: 0 hours 15 minutes 22 seconds  Total Procedure Duration: 0 hours 17 minutes 44 seconds  Findings:                 The perianal and digital rectal examinations were                            normal.                           Two sessile polyps were found in the cecum. The                            polyps were 3 to 6 mm in size. These polyps were                            removed with a cold snare. Resection and retrieval                            were complete.                           A 4 mm polyp was found in the ascending colon. The  polyp was sessile. The polyp was removed with a                            cold snare. Resection and retrieval were complete.                           A 4 mm polyp was found in the recto-sigmoid colon.                            The polyp was sessile. The polyp was removed with a                            cold snare. Resection and retrieval were complete.                           A few small-mouthed diverticula were found in the                            left colon.                           Non-bleeding internal hemorrhoids were found during                            retroflexion.                           The exam was otherwise normal throughout the                            examined colon. Complications:            No immediate complications. Estimated blood loss:                            Minimal. Estimated Blood Loss:     Estimated blood loss was minimal. Impression:               - Two 3 to 6 mm  polyps in the cecum, removed with a                            cold snare. Resected and retrieved.                           - One 4 mm polyp in the ascending colon, removed                            with a cold snare. Resected and retrieved.                           - One 4 mm polyp at the recto-sigmoid colon,                            removed with a cold snare. Resected and retrieved.                           -  Diverticulosis in the left colon.                           - Non-bleeding internal hemorrhoids. Recommendation:           - Patient has a contact number available for                            emergencies. The signs and symptoms of potential                            delayed complications were discussed with the                            patient. Return to normal activities tomorrow.                            Written discharge instructions were provided to the                            patient.                           - Resume previous diet.                           - Continue present medications including aspirin.                           - No ibuprofen, naproxen, or other non-steroidal                            anti-inflammatory drugs for 2 weeks after polyp                            removal.                           - Await pathology results.                           - Repeat colonoscopy is recommended for                            surveillance. The colonoscopy date will be                            determined after pathology results from today's                            exam become available for review. Remo Lipps P. Havery Moros, MD 11/25/2015 8:38:02 AM This report has been signed electronically.

## 2015-11-25 NOTE — Patient Instructions (Addendum)
Impression/Recommendations:  Polyps (handout given) Diverticulosis (handout given) High Fiber Diet (handout given) Hemorrhoids (handout given)  YOU HAD AN ENDOSCOPIC PROCEDURE TODAY AT [facility]:   Refer to the procedure report that was given to you for any specific questions about what was found during the examination.  If the procedure report does not answer your questions, please call your gastroenterologist to clarify.  If you requested that your care partner not be given the details of your procedure findings, then the procedure report has been included in a sealed envelope for you to review at your convenience later.  YOU SHOULD EXPECT: Some feelings of bloating in the abdomen. Passage of more gas than usual.  Walking can help get rid of the air that was put into your GI tract during the procedure and reduce the bloating. If you had a lower endoscopy (such as a colonoscopy or flexible sigmoidoscopy) you may notice spotting of blood in your stool or on the toilet paper. If you underwent a bowel prep for your procedure, you may not have a normal bowel movement for a few days.  Please Note:  You might notice some irritation and congestion in your nose or some drainage.  This is from the oxygen used during your procedure.  There is no need for concern and it should clear up in a day or so.  SYMPTOMS TO REPORT IMMEDIATELY:   Following lower endoscopy (colonoscopy or flexible sigmoidoscopy):  Excessive amounts of blood in the stool  Significant tenderness or worsening of abdominal pains  Swelling of the abdomen that is new, acute  Fever of 100F or higher   Following upper endoscopy (EGD)  Vomiting of blood or coffee ground material  New chest pain or pain under the shoulder blades  Painful or persistently difficult swallowing  New shortness of breath  Fever of 100F or higher  Black, tarry-looking stools  For urgent or emergent issues, a gastroenterologist can be  reached at any hour by calling [phone].   DIET: Your first meal following the procedure should be a small meal and then it is ok to progress to your normal diet. Heavy or fried foods are harder to digest and may make you feel nauseous or bloated.  Likewise, meals heavy in dairy and vegetables can increase bloating.  Drink plenty of fluids but you should avoid alcoholic beverages for 24 hours.  ACTIVITY:  You should plan to take it easy for the rest of today and you should NOT DRIVE or use heavy machinery until tomorrow (because of the sedation medicines used during the test).    FOLLOW UP: Our staff will call the number listed on your records the next business day following your procedure to check on you and address any questions or concerns that you may have regarding the information given to you following your procedure. If we do not reach you, we will leave a message.  However, if you are feeling well and you are not experiencing any problems, there is no need to return our call.  We will assume that you have returned to your regular daily activities without incident.  If any biopsies were taken you will be contacted by phone or by letter within the next 1-3 weeks.  Please call us at [phone] if you have not heard about the biopsies in 3 weeks.    SIGNATURES/CONFIDENTIALITY: You and/or your care partner have signed paperwork which will be entered into your electronic medical record.  These signatures  attest to the fact that that the information above on your After Visit Summary has been reviewed and is understood.  Full responsibility of the confidentiality of this discharge information lies with you and/or your care-partner.YOU HAD AN ENDOSCOPIC PROCEDURE TODAY AT [facility]:   Refer to the procedure report that was given to you for any specific questions about what was found during the examination.  If the procedure report does not answer your questions, please call  your gastroenterologist to clarify.  If you requested that your care partner not be given the details of your procedure findings, then the procedure report has been included in a sealed envelope for you to review at your convenience later.  YOU SHOULD EXPECT: Some feelings of bloating in the abdomen. Passage of more gas than usual.  Walking can help get rid of the air that was put into your GI tract during the procedure and reduce the bloating. If you had a lower endoscopy (such as a colonoscopy or flexible sigmoidoscopy) you may notice spotting of blood in your stool or on the toilet paper. If you underwent a bowel prep for your procedure, you may not have a normal bowel movement for a few days.  Please Note:  You might notice some irritation and congestion in your nose or some drainage.  This is from the oxygen used during your procedure.  There is no need for concern and it should clear up in a day or so.  SYMPTOMS TO REPORT IMMEDIATELY:   Following lower endoscopy (colonoscopy or flexible sigmoidoscopy):  Excessive amounts of blood in the stool  Significant tenderness or worsening of abdominal pains  Swelling of the abdomen that is new, acute  Fever of 100F or higher   For urgent or emergent issues, a gastroenterologist can be reached at any hour by calling [phone].   DIET: Your first meal following the procedure should be a small meal and then it is ok to progress to your normal diet. Heavy or fried foods are harder to digest and may make you feel nauseous or bloated.  Likewise, meals heavy in dairy and vegetables can increase bloating.  Drink plenty of fluids but you should avoid alcoholic beverages for 24 hours.  ACTIVITY:  You should plan to take it easy for the rest of today and you should NOT DRIVE or use heavy machinery until tomorrow (because of the sedation medicines used during the test).    FOLLOW UP: Our staff will call the number listed on your records the next  business day following your procedure to check on you and address any questions or concerns that you may have regarding the information given to you following your procedure. If we do not reach you, we will leave a message.  However, if you are feeling well and you are not experiencing any problems, there is no need to return our call.  We will assume that you have returned to your regular daily activities without incident.  If any biopsies were taken you will be contacted by phone or by letter within the next 1-3 weeks.  Please call us at [phone] if you have not heard about the biopsies in 3 weeks.    SIGNATURES/CONFIDENTIALITY: You and/or your care partner have signed paperwork which will be entered into your electronic medical record.  These signatures attest to the fact that that the information above on your After Visit Summary has been reviewed and is understood.  Full responsibility of the  confidentiality of this discharge information lies with you and/or your care-partner. 

## 2015-11-25 NOTE — Progress Notes (Signed)
Patient awakening,vss,report to rn 

## 2015-11-25 NOTE — Progress Notes (Signed)
Called to room to assist during endoscopic procedure.  Patient ID and intended procedure confirmed with present staff. Received instructions for my participation in the procedure from the performing physician.  

## 2015-11-26 ENCOUNTER — Telehealth: Payer: Self-pay | Admitting: *Deleted

## 2015-11-26 NOTE — Telephone Encounter (Signed)
  Follow up Call-  Call back number 11/25/2015  Post procedure Call Back phone  # [phone]  Permission to leave phone message Yes     Patient questions:  Do you have a fever, pain , or abdominal swelling? No. Pain Score  0 *  Have you tolerated food without any problems? Yes.    Have you been able to return to your normal activities? Yes.    Do you have any questions about your discharge instructions: Diet   No. Medications  No. Follow up visit  No.  Do you have questions or concerns about your Care? No.  Actions: * If pain score is 4 or above: No action needed, pain <4.

## 2015-12-08 ENCOUNTER — Encounter: Payer: Self-pay | Admitting: Gastroenterology

## 2016-04-07 ENCOUNTER — Encounter: Payer: Self-pay | Admitting: Interventional Cardiology

## 2016-04-22 ENCOUNTER — Ambulatory Visit (INDEPENDENT_AMBULATORY_CARE_PROVIDER_SITE_OTHER): Payer: Commercial Managed Care - PPO | Admitting: Interventional Cardiology

## 2016-04-22 ENCOUNTER — Encounter: Payer: Self-pay | Admitting: Interventional Cardiology

## 2016-04-22 VITALS — BP 106/68 | HR 88 | Ht 77.0 in | Wt 275.0 lb

## 2016-04-22 DIAGNOSIS — I2510 Atherosclerotic heart disease of native coronary artery without angina pectoris: Secondary | ICD-10-CM

## 2016-04-22 DIAGNOSIS — E785 Hyperlipidemia, unspecified: Secondary | ICD-10-CM | POA: Diagnosis not present

## 2016-04-22 DIAGNOSIS — I10 Essential (primary) hypertension: Secondary | ICD-10-CM

## 2016-04-22 NOTE — Patient Instructions (Signed)
Medication Instructions:  Your physician recommends that you continue on your current medications as directed. Please refer to the Current Medication list given to you today.   Labwork: None Ordered   Testing/Procedures: None Ordered   Follow-Up: Your physician wants you to follow-up in: 1 year with Dr. Smith. You will receive a reminder letter in the mail two months in advance. If you don't receive a letter, please call our office to schedule the follow-up appointment.   Any Other Special Instructions Will Be Listed Below (If Applicable).     If you need a refill on your cardiac medications before your next appointment, please call your pharmacy.   

## 2016-04-22 NOTE — Progress Notes (Signed)
Cardiology Office Note    Date:  04/22/2016   ID:  Henry Rivera, DOB 1956-06-06, MRN [id number]  PCP:  Thressa Sheller, MD  Cardiologist: Sinclair Grooms, MD   Chief Complaint  Patient presents with  . Coronary Artery Disease    History of Present Illness:  Henry Rivera is a 60 y.o. male who presents for CAD with LAD DES, DM II, hyperlipidemia, and essential hypertension.  No cardiac complaints. Overall doing well. Gaining weight. He tore his right heel ligament. No medication side effects.   Past Medical History:  Diagnosis Date  . Allergic rhinitis   . Allergy   . CAD (coronary artery disease) 4/12   WITH LAD DES   . Diabetes ([place]) 2007  . Elevated cholesterol    pt denies this 11-11-15- states his cholesterol is fine   . GERD (gastroesophageal reflux disease)   . Hyperlipidemia   . Hypertension   . Vitamin D deficiency     Past Surgical History:  Procedure Laterality Date  . COLONOSCOPY  03-27-2004  . CORONARY ANGIOPLASTY WITH STENT PLACEMENT  11-2010  . VASECTOMY      Current Medications: Outpatient Medications Prior to Visit  Medication Sig Dispense Refill  . aspirin 81 MG tablet Take 81 mg by mouth daily.    . Cholecalciferol (VITAMIN D PO) Take by mouth. 1000 units once daily    . esomeprazole (NEXIUM) 40 MG capsule Take 40 mg by mouth daily at 12 noon.    . fexofenadine (ALLEGRA) 180 MG tablet Take 180 mg by mouth daily.    . fluticasone (FLONASE) 50 MCG/ACT nasal spray Place 2 sprays into both nostrils as needed for allergies or rhinitis.    Marland Kitchen irbesartan (AVAPRO) 300 MG tablet Take 300 mg by mouth daily.    . nitroGLYCERIN (NITROSTAT) 0.4 MG SL tablet Place 1 tablet (0.4 mg total) under the tongue every 5 (five) minutes as needed for chest pain. 30 tablet 5  . rosuvastatin (CRESTOR) 5 MG tablet TAKE 1 TABLET EVERY OTHER DAY ORALLY  1  . sitaGLIPtin-metformin (JANUMET) 50-1000 MG per tablet Take 1 tablet by mouth 2 (two) times daily with a  meal.     No facility-administered medications prior to visit.      Allergies:   Augmentin [amoxicillin-pot clavulanate]   Social History   Social History  . Marital status: Married    Spouse name: N/A  . Number of children: N/A  . Years of education: N/A   Social History Main Topics  . Smoking status: Never Smoker  . Smokeless tobacco: Never Used  . Alcohol use No  . Drug use: No  . Sexual activity: Not Asked   Other Topics Concern  . None   Social History Narrative  . None     Family History:  The patient's family history includes Diabetes in his paternal grandfather; Heart failure in his paternal grandfather; Hepatitis in his brother.   ROS:   Please see the history of present illness.    Complaints of dizziness and hearing loss.  All other systems reviewed and are negative.   PHYSICAL EXAM:   VS:  BP 106/68   Pulse 88   Ht 6\' 5"  (1.956 m)   Wt 275 lb (124.7 kg)   BMI 32.61 kg/m    GEN: Well nourished, well developed, in no acute distress  HEENT: normal  Neck: no JVD, carotid bruits, or masses Cardiac: RRR; no murmurs, rubs, or gallops,no edema  Respiratory:  clear to auscultation bilaterally, normal work of breathing GI: soft, nontender, nondistended, + BS MS: no deformity or atrophy  Skin: warm and dry, no rash Neuro:  Alert and Oriented x 3, Strength and sensation are intact Psych: euthymic mood, full affect  Wt Readings from Last 3 Encounters:  04/22/16 275 lb (124.7 kg)  11/25/15 275 lb (124.7 kg)  11/11/15 275 lb (124.7 kg)      Studies/Labs Reviewed:   EKG:  EKG  Normal sinus rhythm, left axis deviation, essentially normal otherwise.  Recent Labs: No results found for requested labs within last 8760 hours.   Lipid Panel No results found for: CHOL, TRIG, HDL, CHOLHDL, VLDL, LDLCALC, LDLDIRECT  Additional studies/ records that were reviewed today include:  No new data. Lipids are followed by his primary care, Dr. Noah Delaine. LDL runs  less than 50. Total cholesterol runs less than 100.    ASSESSMENT:    1. Coronary artery disease involving native coronary artery of native heart without angina pectoris   2. Essential hypertension   3. Hyperlipidemia      PLAN:  In order of problems listed above:  1. Encouraged aerobic activity and weight control. No functional testing is required at this time. His lifestyle is is active. There are no limitations. 2. Low-salt diet. 3. Continue low-dose statin therapy for anti-inflammatory benefits.    Medication Adjustments/Labs and Tests Ordered: Current medicines are reviewed at length with the patient today.  Concerns regarding medicines are outlined above.  Medication changes, Labs and Tests ordered today are listed in the Patient Instructions below. Patient Instructions  Medication Instructions:  Your physician recommends that you continue on your current medications as directed. Please refer to the Current Medication list given to you today.   Labwork: None Ordered   Testing/Procedures: None Ordered   Follow-Up: Your physician wants you to follow-up in: 1 year with Dr. Tamala Julian. You will receive a reminder letter in the mail two months in advance. If you don't receive a letter, please call our office to schedule the follow-up appointment.   Any Other Special Instructions Will Be Listed Below (If Applicable).     If you need a refill on your cardiac medications before your next appointment, please call your pharmacy.      Signed, Sinclair Grooms, MD  04/22/2016 12:49 PM    [address], [place], [place]  [address] Phone: [phone]; Fax: [phone]

## 2016-07-07 ENCOUNTER — Emergency Department (HOSPITAL_COMMUNITY): Payer: Commercial Managed Care - PPO

## 2016-07-07 ENCOUNTER — Encounter (HOSPITAL_COMMUNITY): Payer: Self-pay | Admitting: Emergency Medicine

## 2016-07-07 ENCOUNTER — Emergency Department (HOSPITAL_COMMUNITY)
Admission: EM | Admit: 2016-07-07 | Discharge: 2016-07-07 | Disposition: A | Discharge status: Home / Self Care | Payer: Commercial Managed Care - PPO | Attending: Emergency Medicine | Admitting: Emergency Medicine

## 2016-07-07 DIAGNOSIS — I2510 Atherosclerotic heart disease of native coronary artery without angina pectoris: Secondary | ICD-10-CM | POA: Insufficient documentation

## 2016-07-07 DIAGNOSIS — Z7984 Long term (current) use of oral hypoglycemic drugs: Secondary | ICD-10-CM | POA: Diagnosis not present

## 2016-07-07 DIAGNOSIS — E1165 Type 2 diabetes mellitus with hyperglycemia: Secondary | ICD-10-CM | POA: Insufficient documentation

## 2016-07-07 DIAGNOSIS — J209 Acute bronchitis, unspecified: Secondary | ICD-10-CM | POA: Insufficient documentation

## 2016-07-07 DIAGNOSIS — I10 Essential (primary) hypertension: Secondary | ICD-10-CM | POA: Diagnosis not present

## 2016-07-07 DIAGNOSIS — Z7982 Long term (current) use of aspirin: Secondary | ICD-10-CM | POA: Insufficient documentation

## 2016-07-07 DIAGNOSIS — Z955 Presence of coronary angioplasty implant and graft: Secondary | ICD-10-CM | POA: Insufficient documentation

## 2016-07-07 DIAGNOSIS — R079 Chest pain, unspecified: Secondary | ICD-10-CM | POA: Diagnosis present

## 2016-07-07 HISTORY — DX: Nonrheumatic mitral (valve) insufficiency: I34.0

## 2016-07-07 LAB — I-STAT CHEM 8, ED
BUN: 22 mg/dL — AB (ref 6–20)
CHLORIDE: 101 mmol/L (ref 101–111)
CREATININE: 1.1 mg/dL (ref 0.61–1.24)
Calcium, Ion: 1.2 mmol/L (ref 1.15–1.40)
Glucose, Bld: 254 mg/dL — ABNORMAL HIGH (ref 65–99)
HEMATOCRIT: 46 % (ref 39.0–52.0)
Hemoglobin: 15.6 g/dL (ref 13.0–17.0)
Potassium: 4.6 mmol/L (ref 3.5–5.1)
Sodium: 136 mmol/L (ref 135–145)
TCO2: 24 mmol/L (ref 0–100)

## 2016-07-07 LAB — CBC WITH DIFFERENTIAL/PLATELET
Basophils Absolute: 0 10*3/uL (ref 0.0–0.1)
Basophils Relative: 0 %
EOS PCT: 2 %
Eosinophils Absolute: 0.1 10*3/uL (ref 0.0–0.7)
HEMATOCRIT: 46 % (ref 39.0–52.0)
HEMOGLOBIN: 15.9 g/dL (ref 13.0–17.0)
LYMPHS ABS: 1.4 10*3/uL (ref 0.7–4.0)
LYMPHS PCT: 19 %
MCH: 29.6 pg (ref 26.0–34.0)
MCHC: 34.6 g/dL (ref 30.0–36.0)
MCV: 85.5 fL (ref 78.0–100.0)
Monocytes Absolute: 0.5 10*3/uL (ref 0.1–1.0)
Monocytes Relative: 6 %
NEUTROS ABS: 5.2 10*3/uL (ref 1.7–7.7)
NEUTROS PCT: 73 %
Platelets: 173 10*3/uL (ref 150–400)
RBC: 5.38 MIL/uL (ref 4.22–5.81)
RDW: 13.4 % (ref 11.5–15.5)
WBC: 7.2 10*3/uL (ref 4.0–10.5)

## 2016-07-07 LAB — I-STAT TROPONIN, ED: Troponin i, poc: 0 ng/mL (ref 0.00–0.08)

## 2016-07-07 MED ORDER — ALBUTEROL SULFATE HFA 108 (90 BASE) MCG/ACT IN AERS
2.0000 | INHALATION_SPRAY | RESPIRATORY_TRACT | Status: DC | PRN
  Administered 2016-07-07: 2 via RESPIRATORY_TRACT
  Filled 2016-07-07: qty 6.7

## 2016-07-07 MED ORDER — AEROCHAMBER PLUS W/MASK MISC
1.0000 | Freq: Once | Status: AC
  Administered 2016-07-07: 1
  Filled 2016-07-07: qty 1

## 2016-07-07 NOTE — ED Provider Notes (Signed)
[facility] DEPT Provider Note   CSN: [id number] Arrival date & time: 07/07/16  1049     History   Chief Complaint Chief Complaint  Patient presents with  . Chest Pain    HPI Henry Rivera is a 60 y.o. male.Patient complains of cough headache pain on swallowing for the past 6 days. Cough is productive of yellow sputum. He went to an urgent care center this morning, was seen by physician's assistant had EKG performed which was abnormal he was sent here for further evaluation. Patient also reports pleuritic chest pain 1.5 weeks ago which lasted "a day and a half" which resolved. Did not feel like heart pain he's had in the past. No other associated symptoms. Treated with allegra and Tussionex, without relief.  HPI  Past Medical History:  Diagnosis Date  . Allergic rhinitis   . Allergy   . CAD (coronary artery disease) 4/12   WITH LAD DES   . Diabetes ([place]) 2007  . Elevated cholesterol    pt denies this 11-11-15- states his cholesterol is fine   . GERD (gastroesophageal reflux disease)   . Hyperlipidemia   . Hypertension   . MI (mitral incompetence)   . Vitamin D deficiency     Patient Active Problem List   Diagnosis Date Noted  . Coronary artery disease involving native coronary artery of native heart without angina pectoris 02/21/2014  . Hyperlipidemia 02/21/2014  . 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 Essential hypertension 02/21/2014    Past Surgical History:  Procedure Laterality Date  . COLONOSCOPY  03-27-2004  . CORONARY ANGIOPLASTY WITH STENT PLACEMENT  11-2010  . VASECTOMY         Home Medications    Prior to Admission medications   Medication Sig Start Date End Date Taking? Authorizing Provider  aspirin 81 MG tablet Take 81 mg by mouth daily.   Yes Historical Provider, MD  Cholecalciferol (VITAMIN D PO) Take by mouth. 1000 units once daily   Yes Historical Provider, MD  esomeprazole (NEXIUM) 40 MG capsule Take  40 mg by mouth daily at 12 noon.   Yes Historical Provider, MD  fexofenadine (ALLEGRA) 180 MG tablet Take 180 mg by mouth daily.   Yes Historical Provider, MD  fluticasone (FLONASE) 50 MCG/ACT nasal spray Place 2 sprays into both nostrils as needed for allergies or rhinitis.   Yes Historical Provider, MD  irbesartan (AVAPRO) 300 MG tablet Take 300 mg by mouth daily.   Yes Historical Provider, MD  metFORMIN (GLUCOPHAGE) 1000 MG tablet Take 1,000 mg by mouth 2 (two) times daily with a meal.   Yes Historical Provider, MD  rosuvastatin (CRESTOR) 5 MG tablet TAKE 1 TABLET EVERY OTHER DAY ORALLY 03/05/15  Yes Historical Provider, MD  nitroGLYCERIN (NITROSTAT) 0.4 MG SL tablet Place 1 tablet (0.4 mg total) under the tongue every 5 (five) minutes as needed for chest pain. 02/07/15   Belva Crome, MD  sitaGLIPtin-metformin (JANUMET) 50-1000 MG per tablet Take 1 tablet by mouth 2 (two) times daily with a meal.    Historical Provider, MD    Family History Family History  Problem Relation Age of Onset  . Hepatitis Brother   . Diabetes Paternal Grandfather   . Heart failure Paternal Grandfather     CHF  . Colon cancer Neg Hx   . Colon polyps Neg Hx     Social History Social History  Substance Use Topics  . Smoking status: Never Smoker  .  Smokeless tobacco: Never Used  . Alcohol use No     Allergies   Augmentin [amoxicillin-pot clavulanate]   Review of Systems Review of Systems  Constitutional: Negative.   HENT: Positive for sore throat.   Respiratory: Positive for cough.   Cardiovascular: Positive for chest pain.       Syncope  Gastrointestinal: Negative.   Musculoskeletal: Negative.   Skin: Negative.   Allergic/Immunologic: Positive for immunocompromised state.       Diabetic  Neurological: Positive for headaches.  Psychiatric/Behavioral: Negative.   All other systems reviewed and are negative.    Physical Exam Updated Vital Signs BP 114/82   Pulse 75   Temp 97.6 F (36.4 C)  (Oral)   Resp 15   SpO2 99%   Physical Exam   ED Treatments / Results  Labs (all labs ordered are listed, but only abnormal results are displayed) Labs Reviewed  CBC WITH DIFFERENTIAL/PLATELET  I-STAT CHEM 8, ED  Randolm Idol, ED    EKG  EKG Interpretation  Date/Time:  Wednesday July 07 2016 11:08:21 EST Ventricular Rate:  67 PR Interval:    QRS Duration: 114 QT Interval:  401 QTC Calculation: 424 R Axis:   -2 Text Interpretation:  Sinus rhythm Borderline intraventricular conduction delay Abnormal R-wave progression, late transition No significant change since last tracing Confirmed by Winfred Leeds  MD, Malvika Tung [id number]) on 07/07/2016 11:14:04 AM      Chest x-ray viewed by me Results for orders placed or performed during the hospital encounter of 07/07/16  CBC with Differential/Platelet  Result Value Ref Range   WBC 7.2 4.0 - 10.5 K/uL   RBC 5.38 4.22 - 5.81 MIL/uL   Hemoglobin 15.9 13.0 - 17.0 g/dL   HCT 46.0 39.0 - 52.0 %   MCV 85.5 78.0 - 100.0 fL   MCH 29.6 26.0 - 34.0 pg   MCHC 34.6 30.0 - 36.0 g/dL   RDW 13.4 11.5 - 15.5 %   Platelets 173 150 - 400 K/uL   Neutrophils Relative % 73 %   Neutro Abs 5.2 1.7 - 7.7 K/uL   Lymphocytes Relative 19 %   Lymphs Abs 1.4 0.7 - 4.0 K/uL   Monocytes Relative 6 %   Monocytes Absolute 0.5 0.1 - 1.0 K/uL   Eosinophils Relative 2 %   Eosinophils Absolute 0.1 0.0 - 0.7 K/uL   Basophils Relative 0 %   Basophils Absolute 0.0 0.0 - 0.1 K/uL  I-stat chem 8, ed  Result Value Ref Range   Sodium 136 135 - 145 mmol/L   Potassium 4.6 3.5 - 5.1 mmol/L   Chloride 101 101 - 111 mmol/L   BUN 22 (H) 6 - 20 mg/dL   Creatinine, Ser 1.10 0.61 - 1.24 mg/dL   Glucose, Bld 254 (H) 65 - 99 mg/dL   Calcium, Ion 1.20 1.15 - 1.40 mmol/L   TCO2 24 0 - 100 mmol/L   Hemoglobin 15.6 13.0 - 17.0 g/dL   HCT 46.0 39.0 - 52.0 %  I-stat troponin, ED  Result Value Ref Range   Troponin i, poc 0.00 0.00 - 0.08 ng/mL   Comment 3           Dg Chest  2 View  Result Date: 07/07/2016 CLINICAL DATA:  Abnormal EKG this am at PCP; cough/congestion Xfew days; hx HTN, CAD, diabetic, CABG X5 years ago; non-smoker EXAM: CHEST  2 VIEW COMPARISON:  11/03/2010 FINDINGS: The heart size and mediastinal contours are within normal limits. Both lungs are clear. No  pleural effusion or pneumothorax. The visualized skeletal structures are unremarkable. IMPRESSION: No active cardiopulmonary disease. Electronically Signed   By: Lajean Manes M.D.   On: 07/07/2016 12:50   Radiology No results found.  Procedures Procedures (including critical care time)  Medications Ordered in ED Medications - No data to display   Initial Impression / Assessment and Plan / ED Course  I have reviewed the triage vital signs and the nursing notes.  Pertinent labs & imaging results that were available during my care of the patient were reviewed by me and considered in my medical decision making (see chart for details).  Clinical Course   1:55 PM patient asymptomatic, resting comfortably.  Plan albuterol HFA with spacer to go to use 2 puffs every 4 hours when necessary cough or shortness breath. Follow up with Dr.Mackenzie if not better in a week. Episode of chest pain 1.5 weeks ago was atypical for acute coronary syndrome. Troponin is negative. EKG unchanged Final Clinical Impressions(s) / ED Diagnoses  Dx #1 acute bronchitis #2 hyperglycemia Final diagnoses:  None    New Prescriptions New Prescriptions   No medications on file     Orlie Dakin, MD 07/07/16 1401

## 2016-07-07 NOTE — ED Triage Notes (Signed)
Seen at [facility] this am for URI and told dr there he had cp last wed and thurs had ekg that  At [facility] and it is worisome so he was sent here

## 2016-07-07 NOTE — Discharge Instructions (Signed)
Use your albuterol inhaler 2 puffs every 4 hours as needed for cough or shortness of breath. See Dr.Mackenzie or one of his colleagues in the office if not feeling better in a week. Tell office staff that you were seen in the emergency department today Your blood sugar today was 254. You may need to have adjustment of your diabetes medications

## 2016-07-07 NOTE — Discharge Planning (Signed)
Pt up for discharge. EDCM reviewed chart for possible CM needs.  No needs identified or communicated.  

## 2016-07-07 NOTE — ED Notes (Signed)
Patients manual blood pressure is 118/74

## 2016-07-07 NOTE — ED Notes (Signed)
Pt returned to room from imaging department.  

## 2016-08-04 DIAGNOSIS — J40 Bronchitis, not specified as acute or chronic: Secondary | ICD-10-CM | POA: Diagnosis not present

## 2016-08-04 DIAGNOSIS — R05 Cough: Secondary | ICD-10-CM | POA: Diagnosis not present

## 2016-08-16 DIAGNOSIS — E1122 Type 2 diabetes mellitus with diabetic chronic kidney disease: Secondary | ICD-10-CM | POA: Diagnosis not present

## 2016-09-03 ENCOUNTER — Ambulatory Visit (INDEPENDENT_AMBULATORY_CARE_PROVIDER_SITE_OTHER): Payer: Commercial Managed Care - PPO | Admitting: Endocrinology

## 2016-09-03 ENCOUNTER — Encounter: Payer: Self-pay | Admitting: Endocrinology

## 2016-09-03 VITALS — BP 132/76 | HR 83 | Ht >= 80 in | Wt 270.0 lb

## 2016-09-03 DIAGNOSIS — E1165 Type 2 diabetes mellitus with hyperglycemia: Secondary | ICD-10-CM

## 2016-09-03 LAB — POCT GLYCOSYLATED HEMOGLOBIN (HGB A1C): Hemoglobin A1C: 8.3

## 2016-09-03 MED ORDER — DULAGLUTIDE 0.75 MG/0.5ML ~~LOC~~ SOAJ: SUBCUTANEOUS | 0 refills | Status: DC

## 2016-09-03 NOTE — Patient Instructions (Addendum)
Check blood sugars on waking up  3-4x per week  Also check blood sugars about 2-3 hours after a meal and do this after different meals by rotation  Recommended blood sugar levels on waking up is 90-130 and about 2 hours after meal is 130-160  Please bring your blood sugar monitor to each visit, thank you  Start TRULICITYwith the pen as shown once weekly on the same day of the week.  You may inject in the stomach, thigh or arm as indicated in the brochure given.  You will feel fullness of the stomach with starting the medication and should try to keep the portions at meals small.  You may experience nausea in the first few days which usually gets better over time    If any questions or concerns are present call the office or the  [facility] at [phone]. Also visit [email or web address] website for more useful information  Jardiance 2 daily

## 2016-09-03 NOTE — Progress Notes (Signed)
Patient ID: Henry Rivera, male   DOB: Sep 30, 1955, 61 y.o.   MRN: [id number]            Reason for Appointment: Consultation for Type 2 Diabetes  Referring physician: Noah Delaine   History of Present Illness:          Date of diagnosis of type 2 diabetes mellitus: 2007      Background history:   His A1c was apparently 12.7 at the time of diagnosis when he had increased thirst and urination He was started on Janumet and apparently continued unchanged until last year, this was stopped because of high out-of-pocket expense He was subsequently on metformin only and then Jardiance 10 mg started His A1c in 2017 was 6.7 twice, previous records not available.  Recent history:       Non-insulin hypoglycemic drugs the patient is taking are: Jardiance 10 mg daily, metformin 1 g twice a day  His A1c in 1/18 is now 8.3  Current management, blood sugar patterns and problems identified:  He has had higher sugars over the last couple of months because of having respiratory infections and getting 3 courses of prednisone with doses as high as 40 mg  He thinks he had had blood sugars as high as 400+ with the higher dose prednisone  Last course of prednisone was in early January and he is not taking any now.  His medications have been unchanged and he is still taking metformin and Jardiance  He is checking blood sugars mostly in the morning and recent range is about 132-150, does not know what readings are in the evening lately  Although usually is fairly active with trying to walk up to 5 miles he has not done much because of illness.  He has not been referred here for worsening control  He tends to have about the same weight although in the past has been around 300 pounds when he had his cardiac problem        Side effects from medications have been: None  Compliance with the medical regimen: Usually good Hypoglycemia: none   Glucose monitoring:  done 1 times a day          Glucometer: One Touch .      Blood Glucose readings by recall:  Recently range 132-150 fasting  Self-care: The diet that the patient has been following is: tries to limit high-fat foods .                  Dietician visit, most recent: none, previously attended classes at cardiac rehabilitation               Exercise: Just starting back on walking, previously walking 3-4 miles almost daily     Weight history: Maximum weight 314  Wt Readings from Last 3 Encounters:  09/03/16 270 lb (122.5 kg)  04/22/16 275 lb (124.7 kg)  11/25/15 275 lb (124.7 kg)    Glycemic control:    Lab Results  Component Value Date   HGBA1C 8.3 09/03/2016   Lab Results  Component Value Date   CREATININE 1.10 07/07/2016   No results found for: MICRALBCREAT  Last microalbumin/creatinine ratio normal in 09/2015  Other active problems: See review of systems   Allergies as of 09/03/2016      Reactions   Augmentin [amoxicillin-pot Clavulanate]    Gi sx      Medication List       Accurate as of 09/03/16 11:59 PM. Always use your  most recent med list.          aspirin 81 MG tablet Take 81 mg by mouth daily.   Dulaglutide 0.75 MG/0.5ML Sopn Commonly known as:  TRULICITY Inject in the abdominal skin as directed once a week   esomeprazole 40 MG capsule Commonly known as:  NEXIUM Take 40 mg by mouth daily at 12 noon.   fexofenadine 180 MG tablet Commonly known as:  ALLEGRA Take 180 mg by mouth daily.   FLONASE 50 MCG/ACT nasal spray Generic drug:  fluticasone Place 2 sprays into both nostrils as needed for allergies or rhinitis.   irbesartan 300 MG tablet Commonly known as:  AVAPRO Take 300 mg by mouth daily.   JARDIANCE 10 MG Tabs tablet Generic drug:  empagliflozin Take 10 mg by mouth daily.   metFORMIN 1000 MG tablet Commonly known as:  GLUCOPHAGE Take 1,000 mg by mouth 2 (two) times daily with a meal.   nitroGLYCERIN 0.4 MG SL tablet Commonly known as:  NITROSTAT Place 1  tablet (0.4 mg total) under the tongue every 5 (five) minutes as needed for chest pain.   rosuvastatin 5 MG tablet Commonly known as:  CRESTOR TAKE 1 TABLET EVERY OTHER DAY ORALLY   VITAMIN D PO Take by mouth. 1000 units once daily       Allergies:  Allergies  Allergen Reactions  . Augmentin [Amoxicillin-Pot Clavulanate]     Gi sx    Past Medical History:  Diagnosis Date  . Allergic rhinitis   . Allergy   . CAD (coronary artery disease) 4/12   WITH LAD DES   . Diabetes ([place]) 2007  . Elevated cholesterol    pt denies this 11-11-15- states his cholesterol is fine   . GERD (gastroesophageal reflux disease)   . Hyperlipidemia   . Hypertension   . MI (mitral incompetence)   . Vitamin D deficiency     Past Surgical History:  Procedure Laterality Date  . COLONOSCOPY  03-27-2004  . CORONARY ANGIOPLASTY WITH STENT PLACEMENT  11-2010  . VASECTOMY      Family History  Problem Relation Age of Onset  . Hepatitis Brother   . Diabetes Paternal Grandfather   . Heart failure Paternal Grandfather     CHF  . Colon cancer Neg Hx   . Colon polyps Neg Hx     Social History:  reports that he has never smoked. He has never used smokeless tobacco. He reports that he does not drink alcohol or use drugs.   Review of Systems  Constitutional: Negative for weight loss and reduced appetite.  HENT: Negative for headaches.   Respiratory: Positive for cough. Negative for shortness of breath.        Recently getting over bronchitis  Cardiovascular: Negative for chest pain and leg swelling.  Gastrointestinal: Negative for constipation and diarrhea.  Endocrine: Positive for erectile dysfunction. Negative for polydipsia.       Has difficulty sustaining erections  Genitourinary: Positive for frequency.       Mild, only when blood sugars are high  Musculoskeletal: Negative for joint pain.  Skin: Negative for rash.  Neurological: Positive for tingling.       Has occasional pains in his legs,  mild     Lipid history: Baseline cholesterol has been normal, started on Crestor for history of CAD, currently only on 5 mg with a day His last LDL was 26   No results found for: CHOL, HDL, LDLCALC, LDLDIRECT, TRIG, CHOLHDL  Hypertension: Controlled with Avapro, has had long-standing hypertension  Most recent eye exam was 5/17  Most recent foot exam: 1/18    LABS:  Office Visit on 09/03/2016  Component Date Value Ref Range Status  . Hemoglobin A1C 09/03/2016 8.3   Final    Physical Examination:  BP 132/76   Pulse 83   Ht 6\' 11"  (2.108 m)   Wt 270 lb (122.5 kg)   SpO2 97%   BMI 27.56 kg/m   GENERAL:      Is heavyset.     Patient has Mild generalized obesity.   HEENT:         Eye exam shows normal external appearance. Fundus exam shows no retinopathy.  Oral exam shows normal mucosa .  NECK:   There is no lymphadenopathy Thyroid is not enlarged and no nodules felt.  Carotids are normal to palpation and no bruit heard LUNGS:         Chest is symmetrical. Lungs are clear to auscultation.Marland Kitchen   HEART:         Heart sounds:  S1 and S2 are normal. No murmur or click heard., no S3 or S4.   ABDOMEN:   There is no distention present. Liver and spleen are not palpable.  No other mass or tenderness present.   NEUROLOGICAL:   Ankle jerks are absent bilaterally. biceps reflexes normal     Diabetic Foot Exam - Simple   Simple Foot Form Diabetic Foot exam was performed with the following findings:  Yes 09/03/2016  2:10 PM  Visual Inspection No deformities, no ulcerations, no other skin breakdown bilaterally:  Yes Sensation Testing See comments:  Yes Pulse Check Posterior Tibialis and Dorsalis pulse intact bilaterally:  Yes Comments Decreased on the right great toe Nearly absent on the distal plantar surfaces bilaterally            Vibration sense is Markedly reduced in distal first toes. MUSCULOSKELETAL:  There is no swelling or deformity of the peripheral joints.  Spine  is normal to inspection.   EXTREMITIES:     There is no edema. No skin lesions present.Marland Kitchen SKIN:       No rash or lesions of concern.        ASSESSMENT:  Diabetes type 2, uncontrolled   With BMI 27  See history of present illness for detailed discussion of current diabetes management, blood sugar patterns and problems identified  Previously controlled on Janumet but blood sugars have been progressively higher for several months Currently not well controlled with metformin and Jardiance 10 mg although most of his high readings recently are likely to be from 3 rounds of prednisone for bronchitis as well as not being able to exercise as usual  He has not had any formal diabetes education, generally tries to have a heart healthy diet Currently checking blood sugars mostly fasting and no postprandial readings  Although most of his hyperglycemia recently is related to intercurrent illness and courses of prednisone his blood sugars are still not consistently controlled with a regimen of metformin and low-dose Jardiance mild peripheral neuropathy  Complications of diabetes: Peripheral neuropathy with early sensory loss, mild erectile dysfunction, reportedly no retinopathy, last microalbumin normal in 2/17  MACROVASCULAR disease: Has had coronary artery disease  HYPERTENSION: Well controlled  No history of hyperlipidemia, currently on low-dose Crestor for cardiovascular prophylaxis only  PLAN:    Discussed with the patient the nature of GLP-1 drugs, the action on various organ systems, how they benefit blood  glucose control, as well as the benefit of weight loss and  increase satiety . Explained possible side effects, particularly nausea and vomiting that usually resolve over time; discussed safety information in package insert. Demonstrated the medication injection device and injection technique to the patient.  Showed patient where to inject the medication. To start with 0.75 mg dosage weekly for  the first 4 weeks Patient co-pay card given  Increase Jardiance to 20 mg and on the next prescription use 25 mg  Continue metformin  Start monitoring blood sugars after meals at least half the time  Continue to increase walking  Consultation with dietitian  Patient Instructions  Check blood sugars on waking up  3-4x per week  Also check blood sugars about 2-3 hours after a meal and do this after different meals by rotation  Recommended blood sugar levels on waking up is 90-130 and about 2 hours after meal is 130-160  Please bring your blood sugar monitor to each visit, thank you  Start TRULICITYwith the pen as shown once weekly on the same day of the week.  You may inject in the stomach, thigh or arm as indicated in the brochure given.  You will feel fullness of the stomach with starting the medication and should try to keep the portions at meals small.  You may experience nausea in the first few days which usually gets better over time    If any questions or concerns are present call the office or the  [place] at [phone]. Also visit [email or web address].[email or web address] website for more useful information  Jardiance 2 daily        Consultation note has been sent to the referring physician  [facility] 09/04/2016, 10:57 AM   Note: This office note was prepared with Dragon voice recognition system technology. Any transcriptional errors that result from this process are unintentional.

## 2016-09-16 ENCOUNTER — Telehealth: Payer: Self-pay | Admitting: Endocrinology

## 2016-09-16 NOTE — Telephone Encounter (Signed)
This AM he has a BS of 89 then at 1140 the BS was 466 and then after eating a small side salad and taking a janumet he checked it again at 1240 and it was 166  He does think it was a malfunction with the meter but wanted [place] to document this just in case. He has been having great BS readings since taking the trulicity

## 2016-09-16 NOTE — Telephone Encounter (Signed)
FYI

## 2016-09-16 NOTE — Telephone Encounter (Signed)
Noted  

## 2016-09-21 DIAGNOSIS — J22 Unspecified acute lower respiratory infection: Secondary | ICD-10-CM | POA: Diagnosis not present

## 2016-09-21 DIAGNOSIS — Z0000 Encounter for general adult medical examination without abnormal findings: Secondary | ICD-10-CM | POA: Diagnosis not present

## 2016-09-21 DIAGNOSIS — Z125 Encounter for screening for malignant neoplasm of prostate: Secondary | ICD-10-CM | POA: Diagnosis not present

## 2016-09-21 DIAGNOSIS — I129 Hypertensive chronic kidney disease with stage 1 through stage 4 chronic kidney disease, or unspecified chronic kidney disease: Secondary | ICD-10-CM | POA: Diagnosis not present

## 2016-09-21 DIAGNOSIS — Z0001 Encounter for general adult medical examination with abnormal findings: Secondary | ICD-10-CM | POA: Diagnosis not present

## 2016-09-21 DIAGNOSIS — E1122 Type 2 diabetes mellitus with diabetic chronic kidney disease: Secondary | ICD-10-CM | POA: Diagnosis not present

## 2016-09-24 ENCOUNTER — Other Ambulatory Visit: Payer: Commercial Managed Care - PPO

## 2016-09-28 ENCOUNTER — Encounter: Payer: Self-pay | Admitting: Endocrinology

## 2016-09-28 ENCOUNTER — Ambulatory Visit: Payer: Commercial Managed Care - PPO | Admitting: Endocrinology

## 2016-09-28 ENCOUNTER — Ambulatory Visit (INDEPENDENT_AMBULATORY_CARE_PROVIDER_SITE_OTHER): Payer: Commercial Managed Care - PPO | Admitting: Endocrinology

## 2016-09-28 VITALS — BP 114/76 | HR 86 | Ht 77.0 in | Wt 266.0 lb

## 2016-09-28 DIAGNOSIS — E1165 Type 2 diabetes mellitus with hyperglycemia: Secondary | ICD-10-CM | POA: Diagnosis not present

## 2016-09-28 MED ORDER — DULAGLUTIDE 1.5 MG/0.5ML ~~LOC~~ SOAJ: SUBCUTANEOUS | 1 refills | Status: DC

## 2016-09-28 NOTE — Patient Instructions (Addendum)
Check blood sugars on waking up  3x weekly  Also check blood sugars about 2 hours after a meal and do this after different meals by rotation  Recommended blood sugar levels on waking up is 90-130 and about 2 hours after meal is 130-160  Please bring your blood sugar monitor to each visit, thank you  

## 2016-09-28 NOTE — Progress Notes (Signed)
Patient ID: Henry Rivera, male   DOB: 12/10/1955, 61 y.o.   MRN: [id number]            Reason for Appointment: Follow-up for Type 2 Diabetes  Referring physician: Noah Delaine   History of Present Illness:          Date of diagnosis of type 2 diabetes mellitus: 2007      Background history:   His A1c was apparently 12.7 at the time of diagnosis when he had increased thirst and urination He was started on Janumet and apparently continued unchanged until last year, this was stopped because of high out-of-pocket expense He was subsequently on metformin only and then Jardiance 10 mg started His A1c in 2017 was 6.7 twice, previous records not available.  Recent history:       Non-insulin hypoglycemic drugs the patient is taking are: Trulicity A999333 mg weekly, off Jardiance currently, metformin 1 g twice a day  His A1c in 1/18 was 8.3  Current management, blood sugar patterns and problems identified:  He has been started on Trulicity since his initial consultation and has taken the fourth injection of the 0.75 mg yesterday  With this his blood sugars apparently at home are improved although he is checking mostly fasting readings  His blood sugars did go up to about 150-164 about 3 days after getting steroid injection for bronchitis  Has only a couple of readings after meals but these are not usually 2 hours after eating  He does not see any consistent change in his portion control with using Trulicity.  However has lost some more weight  Still trying to walk regularly 3-4 times a week  He thinks overall is trying to watch portion  He ran out of his Jardiance about 12 days ago and he did not start back on it as he did not think his sugars were any different without it        Side effects from medications have been: None  Compliance with the medical regimen: Usually good Hypoglycemia: none   Glucose monitoring:  done 1 times a day         Glucometer: One Touch .        Blood Glucose readings by recall:  Mean values apply above for all meters except median for One Touch  PRE-MEAL Fasting Lunch Dinner Bedtime Overall  Glucose range: 98-165 110     Mean/median:        POST-MEAL PC Breakfast PC Lunch PC Dinner  Glucose range:  85   Mean/median:        Self-care: The diet that the patient has been following is: tries to limit high-fat foods .                  Dietician visit, most recent: none, previously attended classes at cardiac rehabilitation               Exercise:  walking, Usually walking 3-4 miles almost daily     Weight history: Maximum weight 314  Wt Readings from Last 3 Encounters:  09/28/16 266 lb (120.7 kg)  09/03/16 270 lb (122.5 kg)  04/22/16 275 lb (124.7 kg)    Glycemic control:    Lab Results  Component Value Date   HGBA1C 8.3 09/03/2016   Lab Results  Component Value Date   CREATININE 1.10 07/07/2016   No results found for: MICRALBCREAT  Last microalbumin/creatinine ratio normal in 09/2015  Other active problems: See review of systems  Allergies as of 09/28/2016      Reactions   Augmentin [amoxicillin-pot Clavulanate]    Gi sx      Medication List       Accurate as of 09/28/16  2:51 PM. Always use your most recent med list.          aspirin 81 MG tablet Take 81 mg by mouth daily.   Dulaglutide 1.5 MG/0.5ML Sopn Commonly known as:  TRULICITY Inject weekly   esomeprazole 40 MG capsule Commonly known as:  NEXIUM Take 40 mg by mouth daily at 12 noon.   fexofenadine 180 MG tablet Commonly known as:  ALLEGRA Take 180 mg by mouth daily.   FLONASE 50 MCG/ACT nasal spray Generic drug:  fluticasone Place 2 sprays into both nostrils as needed for allergies or rhinitis.   irbesartan 300 MG tablet Commonly known as:  AVAPRO Take 300 mg by mouth daily.   JARDIANCE 10 MG Tabs tablet Generic drug:  empagliflozin Take 10 mg by mouth daily.   metFORMIN 1000 MG tablet Commonly known as:   GLUCOPHAGE Take 1,000 mg by mouth 2 (two) times daily with a meal.   nitroGLYCERIN 0.4 MG SL tablet Commonly known as:  NITROSTAT Place 1 tablet (0.4 mg total) under the tongue every 5 (five) minutes as needed for chest pain.   rosuvastatin 5 MG tablet Commonly known as:  CRESTOR TAKE 1 TABLET EVERY OTHER DAY ORALLY   VITAMIN D PO Take by mouth. 1000 units once daily       Allergies:  Allergies  Allergen Reactions  . Augmentin [Amoxicillin-Pot Clavulanate]     Gi sx    Past Medical History:  Diagnosis Date  . Allergic rhinitis   . Allergy   . CAD (coronary artery disease) 4/12   WITH LAD DES   . Diabetes ([place]) 2007  . Elevated cholesterol    pt denies this 11-11-15- states his cholesterol is fine   . GERD (gastroesophageal reflux disease)   . Hyperlipidemia   . Hypertension   . MI (mitral incompetence)   . Vitamin D deficiency     Past Surgical History:  Procedure Laterality Date  . COLONOSCOPY  03-27-2004  . CORONARY ANGIOPLASTY WITH STENT PLACEMENT  11-2010  . VASECTOMY      Family History  Problem Relation Age of Onset  . Hepatitis Brother   . Diabetes Paternal Grandfather   . Heart failure Paternal Grandfather     CHF  . Colon cancer Neg Hx   . Colon polyps Neg Hx     Social History:  reports that he has never smoked. He has never used smokeless tobacco. He reports that he does not drink alcohol or use drugs.   Review of Systems   Lipid history: Baseline cholesterol has been normal, started on Crestor for history of CAD, currently only on 5 mg with a day His last LDL was 26   No results found for: CHOL, HDL, LDLCALC, LDLDIRECT, TRIG, CHOLHDL         Hypertension: Controlled with Avapro, has had long-standing hypertension  Most recent eye exam was 5/17  Most recent foot exam: 1/18    LABS:  No visits with results within 1 Week(s) from this visit.  Latest known visit with results is:  Office Visit on 09/03/2016  Component Date Value Ref  Range Status  . Hemoglobin A1C 09/03/2016 8.3   Final    Physical Examination:  BP 114/76   Pulse 86   Ht 6'  5" (1.956 m)   Wt 266 lb (120.7 kg)   SpO2 97%   BMI 31.54 kg/m    ASSESSMENT:  Diabetes type 2, uncontrolled   With BMI 27  See history of present illness for detailed discussion of current diabetes management, blood sugar patterns and problems identified  He has done relatively better with taking A999333 mg Trulicity along with his metformin His fasting blood sugars are near normal However he still does not understand the need to check readings after meals as this is a component of his A1c which was 8.3 about a month ago Has lost some weight, even with stopping Jardiance which he does not think was helping and he does not want to continue  PLAN:     Discussed at he will do better long-term with increasing the Trulicity  More readings after meals and bring monitor for download on the next visit  Discussed blood sugar targets  A1c in about 2 months  Consultation with dietitian  Patient Instructions  Check blood sugars on waking up  3x weekly  Also check blood sugars about 2 hours after a meal and do this after different meals by rotation  Recommended blood sugar levels on waking up is 90-130 and about 2 hours after meal is 130-160  Please bring your blood sugar monitor to each visit, thank you      [facility] 09/28/2016, 2:51 PM   Note: This office note was prepared with Dragon voice recognition system technology. Any transcriptional errors that result from this process are unintentional.

## 2016-09-29 DIAGNOSIS — N182 Chronic kidney disease, stage 2 (mild): Secondary | ICD-10-CM | POA: Diagnosis not present

## 2016-09-29 DIAGNOSIS — Z0000 Encounter for general adult medical examination without abnormal findings: Secondary | ICD-10-CM | POA: Diagnosis not present

## 2016-09-29 DIAGNOSIS — E1122 Type 2 diabetes mellitus with diabetic chronic kidney disease: Secondary | ICD-10-CM | POA: Diagnosis not present

## 2016-10-28 ENCOUNTER — Telehealth: Payer: Self-pay | Admitting: Endocrinology

## 2016-10-28 ENCOUNTER — Other Ambulatory Visit: Payer: Self-pay

## 2016-10-28 MED ORDER — DULAGLUTIDE 0.75 MG/0.5ML ~~LOC~~ SOAJ: 0.7500 mg | SUBCUTANEOUS | 3 refills | Status: DC

## 2016-10-28 NOTE — Telephone Encounter (Signed)
Please send new prescription for the 0.75 mg weekly dose of Trulicity

## 2016-10-28 NOTE — Telephone Encounter (Signed)
Ordered

## 2016-10-28 NOTE — Telephone Encounter (Signed)
Pt called in and said that he tried the Trulicity 0.03 for about a month and he said that the medication is just making him sick.  He wanted to know if he could go back to the lower dose. Please advise and call patient.

## 2016-11-16 DIAGNOSIS — L570 Actinic keratosis: Secondary | ICD-10-CM | POA: Diagnosis not present

## 2016-11-16 DIAGNOSIS — L723 Sebaceous cyst: Secondary | ICD-10-CM | POA: Diagnosis not present

## 2016-11-16 DIAGNOSIS — L089 Local infection of the skin and subcutaneous tissue, unspecified: Secondary | ICD-10-CM | POA: Diagnosis not present

## 2016-11-23 ENCOUNTER — Other Ambulatory Visit (INDEPENDENT_AMBULATORY_CARE_PROVIDER_SITE_OTHER): Payer: Commercial Managed Care - PPO

## 2016-11-23 DIAGNOSIS — E1165 Type 2 diabetes mellitus with hyperglycemia: Secondary | ICD-10-CM

## 2016-11-23 LAB — HEMOGLOBIN A1C: Hgb A1c MFr Bld: 7.4 % — ABNORMAL HIGH (ref 4.6–6.5)

## 2016-11-23 LAB — BASIC METABOLIC PANEL
BUN: 15 mg/dL (ref 6–23)
CHLORIDE: 105 meq/L (ref 96–112)
CO2: 27 meq/L (ref 19–32)
Calcium: 9.6 mg/dL (ref 8.4–10.5)
Creatinine, Ser: 1.01 mg/dL (ref 0.40–1.50)
GFR: 79.85 mL/min (ref >60.00)
Glucose, Bld: 157 mg/dL — ABNORMAL HIGH (ref 70–99)
Potassium: 4.1 mEq/L (ref 3.5–5.1)
SODIUM: 138 meq/L (ref 135–145)

## 2016-11-24 LAB — FRUCTOSAMINE: FRUCTOSAMINE: 272 umol/L (ref 0–285)

## 2016-11-25 ENCOUNTER — Ambulatory Visit: Payer: Commercial Managed Care - PPO | Admitting: Endocrinology

## 2016-11-26 ENCOUNTER — Ambulatory Visit: Payer: Commercial Managed Care - PPO | Admitting: Endocrinology

## 2016-12-03 ENCOUNTER — Ambulatory Visit (INDEPENDENT_AMBULATORY_CARE_PROVIDER_SITE_OTHER): Payer: Commercial Managed Care - PPO | Admitting: Endocrinology

## 2016-12-03 ENCOUNTER — Encounter: Payer: Self-pay | Admitting: Endocrinology

## 2016-12-03 VITALS — BP 128/94 | HR 77 | Ht 77.0 in | Wt 271.6 lb

## 2016-12-03 DIAGNOSIS — E1165 Type 2 diabetes mellitus with hyperglycemia: Secondary | ICD-10-CM | POA: Diagnosis not present

## 2016-12-03 NOTE — Progress Notes (Signed)
Patient ID: Henry Rivera, male   DOB: Jul 10, 1956, 61 y.o.   MRN: [id number]            Reason for Appointment: Follow-up for Type 2 Diabetes  Referring physician: Noah Delaine   History of Present Illness:          Date of diagnosis of type 2 diabetes mellitus: 2007      Background history:   His A1c was apparently 12.7 at the time of diagnosis when he had increased thirst and urination He was started on Janumet and apparently continued unchanged until last year, this was stopped because of high out-of-pocket expense He was subsequently on metformin only and then Jardiance 10 mg started His A1c in 2017 was 6.7 twice, previous records not available.  Recent history:       Non-insulin hypoglycemic drugs the patient is taking are: Trulicity 1.19 mg weekly,metformin 1 g twice a day  His A1c in 1/18 was 8.3 and is now down to 7.4, however fructosamine is 272  Current management, blood sugar patterns and problems identified:  He has not been able to tolerate the 1.5 mg Trulicity because of nausea  Also has not been able to lose weight, is up about 5 pounds since his last visit  However he thinks this is mostly because of not exercising or walking as much as usual  Also because of his busy schedule he is not always able to make perfect choices with his diet and meal plan and may occasionally fast food  He did not want to see the dietitian as he thinks he knows what to do  Glucose after his bolus cereal in the morning was 157 in the lab       Side effects from medications have been: None  Compliance with the medical regimen: Usually good Hypoglycemia: none   Glucose monitoring:  done 1 times a day         Glucometer: One Touch ultra mini .      Blood Glucose readings by download averaging 132 with only FASTING blood sugars, range 115-160:   Self-care: The diet that the patient has been following is: tries to limit high-fat foods .                  Dietician visit, most  recent: none, previously attended classes at cardiac rehabilitation               Exercise:  walking, Usually walking 3-4 miles almost daily     Weight history: Maximum weight 314  Wt Readings from Last 3 Encounters:  12/03/16 271 lb 9.6 oz (123.2 kg)  09/28/16 266 lb (120.7 kg)  09/03/16 270 lb (122.5 kg)    Glycemic control:    Lab Results  Component Value Date   HGBA1C 7.4 (H) 11/23/2016   HGBA1C 8.3 09/03/2016   Lab Results  Component Value Date   CREATININE 1.01 11/23/2016   No results found for: MICRALBCREAT  Last microalbumin/creatinine ratio normal in 09/2015  Other active problems: See review of systems   Allergies as of 12/03/2016      Reactions   Augmentin [amoxicillin-pot Clavulanate]    Gi sx      Medication List       Accurate as of 12/03/16 10:49 AM. Always use your most recent med list.          aspirin 81 MG tablet Take 81 mg by mouth daily.   COUGH SYRUP PO Take by mouth. Takes  as needed   Dulaglutide 0.75 MG/0.5ML Sopn Commonly known as:  TRULICITY Inject 7.85 mg into the skin once a week.   esomeprazole 40 MG capsule Commonly known as:  NEXIUM Take 40 mg by mouth daily at 12 noon.   fexofenadine 180 MG tablet Commonly known as:  ALLEGRA Take 180 mg by mouth daily.   FLONASE 50 MCG/ACT nasal spray Generic drug:  fluticasone Place 2 sprays into both nostrils as needed for allergies or rhinitis.   irbesartan 300 MG tablet Commonly known as:  AVAPRO Take 300 mg by mouth daily.   JARDIANCE 10 MG Tabs tablet Generic drug:  empagliflozin Take 10 mg by mouth daily.   metFORMIN 1000 MG tablet Commonly known as:  GLUCOPHAGE Take 1,000 mg by mouth 2 (two) times daily with a meal.   nitroGLYCERIN 0.4 MG SL tablet Commonly known as:  NITROSTAT Place 1 tablet (0.4 mg total) under the tongue every 5 (five) minutes as needed for chest pain.   rosuvastatin 5 MG tablet Commonly known as:  CRESTOR TAKE 1 TABLET EVERY OTHER DAY ORALLY     VITAMIN D PO Take by mouth. 1000 units once daily       Allergies:  Allergies  Allergen Reactions  . Augmentin [Amoxicillin-Pot Clavulanate]     Gi sx    Past Medical History:  Diagnosis Date  . Allergic rhinitis   . Allergy   . CAD (coronary artery disease) 4/12   WITH LAD DES   . Diabetes ([place]) 2007  . Elevated cholesterol    pt denies this 11-11-15- states his cholesterol is fine   . GERD (gastroesophageal reflux disease)   . Hyperlipidemia   . Hypertension   . MI (mitral incompetence)   . Vitamin D deficiency     Past Surgical History:  Procedure Laterality Date  . COLONOSCOPY  03-27-2004  . CORONARY ANGIOPLASTY WITH STENT PLACEMENT  11-2010  . VASECTOMY      Family History  Problem Relation Age of Onset  . Hepatitis Brother   . Diabetes Paternal Grandfather   . Heart failure Paternal Grandfather     CHF  . Colon cancer Neg Hx   . Colon polyps Neg Hx     Social History:  reports that he has never smoked. He has never used smokeless tobacco. He reports that he does not drink alcohol or use drugs.   Review of Systems   Lipid history: Baseline cholesterol has been normal, started on Crestor for history of CAD, currently only on 5 mg with a day His last LDL was 26 And triglycerides 161   No results found for: CHOL, HDL, LDLCALC, LDLDIRECT, TRIG, CHOLHDL         Hypertension: Treated with Avapro, has had long-standing hypertension and is followed by PCP  Most recent eye exam was 5/17  Most recent foot exam: 1/18    LABS:  No visits with results within 1 Week(s) from this visit.  Latest known visit with results is:  Lab on 11/23/2016  Component Date Value Ref Range Status  . Sodium 11/23/2016 138  135 - 145 mEq/L Final  . Potassium 11/23/2016 4.1  3.5 - 5.1 mEq/L Final  . Chloride 11/23/2016 105  96 - 112 mEq/L Final  . CO2 11/23/2016 27  19 - 32 mEq/L Final  . Glucose, Bld 11/23/2016 157* 70 - 99 mg/dL Final  . BUN 11/23/2016 15  6 - 23 mg/dL  Final  . Creatinine, Ser 11/23/2016 1.01  0.40 -  1.50 mg/dL Final  . Calcium 11/23/2016 9.6  8.4 - 10.5 mg/dL Final  . GFR 11/23/2016 79.85  >60.00 mL/min Final  . Fructosamine 11/23/2016 272  0 - 285 umol/L Final   Comment: Published reference interval for apparently healthy subjects between age 34 and 81 is 71 - 285 umol/L and in a poorly controlled diabetic population is 228 - 563 umol/L with a mean of 396 umol/L.   Marland Kitchen Hgb A1c MFr Bld 11/23/2016 7.4* 4.6 - 6.5 % Final    Physical Examination:  BP (!) 128/94   Pulse 77   Ht 6\' 5"  (1.956 m)   Wt 271 lb 9.6 oz (123.2 kg)   SpO2 98%   BMI 32.21 kg/m    ASSESSMENT:  Diabetes type 2, uncontrolled   With BMI 27  See history of present illness for detailed discussion of current diabetes management, blood sugar patterns and problems identified  He has done relatively better with taking 3.41 mg Trulicity along with his metformin His fasting blood sugars are near normal However he still does not understand the need to check readings after meals as this is a component of his A1c which was 8.3 about a month ago Has lost some weight, even with stopping Jardiance which he does not think was helping and he does not want to continue  PLAN:    Start checking readings after meals especially supper to help improve compliance with diet  He will get a new glucose monitor, needs to check his insurance preference   Discussed need for weight loss and he will try to do better with his diet as well as start walking more regularly  A1c in about 3 months  Patient Instructions  Check blood sugars on waking up  3x weekly  Also check blood sugars about 2 hours after a meal and do this after different meals by rotation  Recommended blood sugar levels on waking up is 90-130 and about 2 hours after meal is 130-160  Please bring your blood sugar monitor to each visit, thank you  Check coverage for meter     [facility] 12/03/2016, 10:49 AM    Note: This office note was prepared with Dragon voice recognition system technology. Any transcriptional errors that result from this process are unintentional.

## 2016-12-03 NOTE — Patient Instructions (Addendum)
Check blood sugars on waking up  3x weekly  Also check blood sugars about 2 hours after a meal and do this after different meals by rotation  Recommended blood sugar levels on waking up is 90-130 and about 2 hours after meal is 130-160  Please bring your blood sugar monitor to each visit, thank you  Check coverage for meter

## 2016-12-14 DIAGNOSIS — L723 Sebaceous cyst: Secondary | ICD-10-CM | POA: Diagnosis not present

## 2016-12-14 DIAGNOSIS — L570 Actinic keratosis: Secondary | ICD-10-CM | POA: Diagnosis not present

## 2016-12-14 DIAGNOSIS — L219 Seborrheic dermatitis, unspecified: Secondary | ICD-10-CM | POA: Diagnosis not present

## 2016-12-29 DIAGNOSIS — H2513 Age-related nuclear cataract, bilateral: Secondary | ICD-10-CM | POA: Diagnosis not present

## 2016-12-29 DIAGNOSIS — H25013 Cortical age-related cataract, bilateral: Secondary | ICD-10-CM | POA: Diagnosis not present

## 2016-12-29 DIAGNOSIS — E119 Type 2 diabetes mellitus without complications: Secondary | ICD-10-CM | POA: Diagnosis not present

## 2016-12-29 LAB — HM DIABETES EYE EXAM

## 2017-01-05 DIAGNOSIS — J0100 Acute maxillary sinusitis, unspecified: Secondary | ICD-10-CM | POA: Diagnosis not present

## 2017-01-05 DIAGNOSIS — J209 Acute bronchitis, unspecified: Secondary | ICD-10-CM | POA: Diagnosis not present

## 2017-02-10 ENCOUNTER — Other Ambulatory Visit: Payer: Self-pay

## 2017-02-10 MED ORDER — DULAGLUTIDE 0.75 MG/0.5ML ~~LOC~~ SOAJ: 0.7500 mg | SUBCUTANEOUS | 3 refills | Status: DC

## 2017-03-01 ENCOUNTER — Other Ambulatory Visit (INDEPENDENT_AMBULATORY_CARE_PROVIDER_SITE_OTHER): Payer: Commercial Managed Care - PPO

## 2017-03-01 DIAGNOSIS — E1165 Type 2 diabetes mellitus with hyperglycemia: Secondary | ICD-10-CM

## 2017-03-01 LAB — BASIC METABOLIC PANEL
BUN: 18 mg/dL (ref 6–23)
CO2: 25 meq/L (ref 19–32)
Calcium: 9.9 mg/dL (ref 8.4–10.5)
Chloride: 103 mEq/L (ref 96–112)
Creatinine, Ser: 1.19 mg/dL (ref 0.40–1.50)
GFR: 66.02 mL/min (ref >60.00)
GLUCOSE: 173 mg/dL — AB (ref 70–99)
POTASSIUM: 4.4 meq/L (ref 3.5–5.1)
SODIUM: 135 meq/L (ref 135–145)

## 2017-03-01 LAB — HEMOGLOBIN A1C: Hgb A1c MFr Bld: 6.4 % (ref 4.6–6.5)

## 2017-03-02 ENCOUNTER — Other Ambulatory Visit: Payer: Self-pay

## 2017-03-02 MED ORDER — NITROGLYCERIN 0.4 MG SL SUBL: 0.4000 mg | SUBLINGUAL_TABLET | SUBLINGUAL | 0 refills | Status: DC | PRN

## 2017-03-04 ENCOUNTER — Encounter: Payer: Self-pay | Admitting: Endocrinology

## 2017-03-04 ENCOUNTER — Ambulatory Visit (INDEPENDENT_AMBULATORY_CARE_PROVIDER_SITE_OTHER): Payer: Commercial Managed Care - PPO | Admitting: Endocrinology

## 2017-03-04 VITALS — BP 102/72 | HR 88 | Ht 77.0 in | Wt 271.0 lb

## 2017-03-04 DIAGNOSIS — E669 Obesity, unspecified: Secondary | ICD-10-CM

## 2017-03-04 DIAGNOSIS — E1169 Type 2 diabetes mellitus with other specified complication: Secondary | ICD-10-CM

## 2017-03-04 NOTE — Progress Notes (Signed)
Patient ID: Henry Rivera, male   DOB: 01-May-1956, 61 y.o.   MRN: [id number]            Reason for Appointment: Follow-up for Type 2 Diabetes  Referring physician: Noah Delaine   History of Present Illness:          Date of diagnosis of type 2 diabetes mellitus: 2007      Background history:   His A1c was apparently 12.7 at the time of diagnosis when he had increased thirst and urination He was started on Janumet and apparently continued unchanged until last year, this was stopped because of high out-of-pocket expense He was subsequently on metformin only and then Jardiance 10 mg started His A1c in 2017 was 6.7 twice, previous records not available.  Recent history:       Non-insulin hypoglycemic drugs the patient is taking are: Trulicity 8.01 mg weekly,metformin 1 g twice a day   His A1c in 1/18 was 8.3 and has progressively improved, now 6.4  Current management, blood sugar patterns and problems identified:  He has excellent blood sugars at home although as high as 159 fasting and 186 after a meal  He does check his sugars up to twice a day both fasting and after evening meal but only occasionally during the day  Despite continuing Trulicity he has not lost any weight  He does however do well with his regular walking program up to 4 miles a day       Side effects from medications have been: None  Compliance with the medical regimen: Usually good Hypoglycemia: none   Glucose monitoring:  done 1 times a day         Glucometer: One Touch ultra mini .      Blood Glucose readings by download as follows  FASTING range 88-1 43 averaging 116 EVENING average 128 with range 82-1 86 Overall MEDIAN 118   Self-care: The diet that the patient has been following is: tries to limit high-fat foods .                  Dietician visit, most recent: none, previously attended classes at cardiac rehabilitation               Exercise:  walking, Usually walking 3-4 miles almost daily      Weight history: Maximum weight 314  Wt Readings from Last 3 Encounters:  03/04/17 271 lb (122.9 kg)  12/03/16 271 lb 9.6 oz (123.2 kg)  09/28/16 266 lb (120.7 kg)    Glycemic control:    Lab Results  Component Value Date   HGBA1C 6.4 03/01/2017   HGBA1C 7.4 (H) 11/23/2016   HGBA1C 8.3 09/03/2016   Lab Results  Component Value Date   CREATININE 1.19 03/01/2017   No results found for: MICRALBCREAT  Last microalbumin/creatinine ratio normal in 09/2015  Other active problems: See review of systems   Allergies as of 03/04/2017      Reactions   Augmentin [amoxicillin-pot Clavulanate]    Gi sx      Medication List       Accurate as of 03/04/17 11:59 PM. Always use your most recent med list.          aspirin 81 MG tablet Take 81 mg by mouth daily.   COUGH SYRUP PO Take by mouth. Takes as needed   Dulaglutide 0.75 MG/0.5ML Sopn Commonly known as:  TRULICITY Inject 6.55 mg into the skin once a week.   esomeprazole 40 MG  capsule Commonly known as:  NEXIUM Take 40 mg by mouth daily at 12 noon.   fexofenadine 180 MG tablet Commonly known as:  ALLEGRA Take 180 mg by mouth daily.   FLONASE 50 MCG/ACT nasal spray Generic drug:  fluticasone Place 2 sprays into both nostrils as needed for allergies or rhinitis.   irbesartan 300 MG tablet Commonly known as:  AVAPRO Take 300 mg by mouth daily.   metFORMIN 1000 MG tablet Commonly known as:  GLUCOPHAGE Take 1,000 mg by mouth 2 (two) times daily with a meal.   nitroGLYCERIN 0.4 MG SL tablet Commonly known as:  NITROSTAT Place 1 tablet (0.4 mg total) under the tongue every 5 (five) minutes as needed for chest pain.   rosuvastatin 5 MG tablet Commonly known as:  CRESTOR TAKE 1 TABLET EVERY OTHER DAY ORALLY   VITAMIN D PO Take by mouth. 1000 units once daily       Allergies:  Allergies  Allergen Reactions  . Augmentin [Amoxicillin-Pot Clavulanate]     Gi sx    Past Medical History:  Diagnosis Date    . Allergic rhinitis   . Allergy   . CAD (coronary artery disease) 4/12   WITH LAD DES   . Diabetes ([place]) 2007  . Elevated cholesterol    pt denies this 11-11-15- states his cholesterol is fine   . GERD (gastroesophageal reflux disease)   . Hyperlipidemia   . Hypertension   . MI (mitral incompetence)   . Vitamin D deficiency     Past Surgical History:  Procedure Laterality Date  . COLONOSCOPY  03-27-2004  . CORONARY ANGIOPLASTY WITH STENT PLACEMENT  11-2010  . VASECTOMY      Family History  Problem Relation Age of Onset  . Hepatitis Brother   . Diabetes Paternal Grandfather   . Heart failure Paternal Grandfather        CHF  . Colon cancer Neg Hx   . Colon polyps Neg Hx     Social History:  reports that he has never smoked. He has never used smokeless tobacco. He reports that he does not drink alcohol or use drugs.   Review of Systems   Lipid history: Baseline cholesterol has been normal, started on Crestor for history of CAD, currently only on 5 mg Every other day as prescribed by PCP His last LDL was 26 And triglycerides 161   No results found for: CHOL, HDL, LDLCALC, LDLDIRECT, TRIG, CHOLHDL         Hypertension: Treated with Avapro 300 mg, has had long-standing hypertension and is followed by PCP  Most recent eye exam was 5/17  Most recent foot exam: 1/18    LABS:  Lab on 03/01/2017  Component Date Value Ref Range Status  . Hgb A1c MFr Bld 03/01/2017 6.4  4.6 - 6.5 % Final   Glycemic Control Guidelines for People with Diabetes:Non Diabetic:  <6%Goal of Therapy: <7%Additional Action Suggested:  >8%   . Sodium 03/01/2017 135  135 - 145 mEq/L Final  . Potassium 03/01/2017 4.4  3.5 - 5.1 mEq/L Final  . Chloride 03/01/2017 103  96 - 112 mEq/L Final  . CO2 03/01/2017 25  19 - 32 mEq/L Final  . Glucose, Bld 03/01/2017 173* 70 - 99 mg/dL Final  . BUN 03/01/2017 18  6 - 23 mg/dL Final  . Creatinine, Ser 03/01/2017 1.19  0.40 - 1.50 mg/dL Final  . Calcium  03/01/2017 9.9  8.4 - 10.5 mg/dL Final  . GFR 03/01/2017  66.02  >60.00 mL/min Final    Physical Examination:  BP 102/72   Pulse 88   Ht 6\' 5"  (1.956 m)   Wt 271 lb (122.9 kg)   SpO2 97%   BMI 32.14 kg/m    ASSESSMENT:  Diabetes type 2, uncontrolled   With BMI 27  See history of present illness for detailed discussion of current diabetes management, blood sugar patterns and problems identified  He has done very well with getting his A1c now down to 6.4% with continuing Trulicity along with metformin Previously Jardiance did not seem to help Although his doing well with his exercise regimen and also generally watching his diet he is not able to lose weight  Urine microalbumin is due and make sure this is done at his PCP on his upcoming physical  PLAN:    No need to change his current medication regimen, previously had nausea with higher dose of Trulicity May also consider using Ozempic next year if covered by insurance to promote better weight loss  Patient Instructions  Check blood sugars on waking up  3/7 days  Also check blood sugars about 2 hours after a meal and do this after different meals by rotation  Recommended blood sugar levels on waking up is 90-130 and about 2 hours after meal is 130-160  Please bring your blood sugar monitor to each visit, thank you      [facility] 03/05/2017, 1:30 PM   Note: This office note was prepared with Dragon voice recognition system technology. Any transcriptional errors that result from this process are unintentional.

## 2017-03-04 NOTE — Patient Instructions (Signed)
Check blood sugars on waking up  3/7 days  Also check blood sugars about 2 hours after a meal and do this after different meals by rotation  Recommended blood sugar levels on waking up is 90-130 and about 2 hours after meal is 130-160  Please bring your blood sugar monitor to each visit, thank you   

## 2017-03-22 DIAGNOSIS — E559 Vitamin D deficiency, unspecified: Secondary | ICD-10-CM | POA: Diagnosis not present

## 2017-03-22 DIAGNOSIS — E1122 Type 2 diabetes mellitus with diabetic chronic kidney disease: Secondary | ICD-10-CM | POA: Diagnosis not present

## 2017-03-22 DIAGNOSIS — E785 Hyperlipidemia, unspecified: Secondary | ICD-10-CM | POA: Diagnosis not present

## 2017-03-22 DIAGNOSIS — I129 Hypertensive chronic kidney disease with stage 1 through stage 4 chronic kidney disease, or unspecified chronic kidney disease: Secondary | ICD-10-CM | POA: Diagnosis not present

## 2017-03-25 ENCOUNTER — Encounter: Payer: Self-pay | Admitting: Interventional Cardiology

## 2017-03-29 DIAGNOSIS — N182 Chronic kidney disease, stage 2 (mild): Secondary | ICD-10-CM | POA: Diagnosis not present

## 2017-03-29 DIAGNOSIS — E1122 Type 2 diabetes mellitus with diabetic chronic kidney disease: Secondary | ICD-10-CM | POA: Diagnosis not present

## 2017-03-29 DIAGNOSIS — I209 Angina pectoris, unspecified: Secondary | ICD-10-CM | POA: Diagnosis not present

## 2017-04-15 ENCOUNTER — Encounter: Payer: Self-pay | Admitting: Interventional Cardiology

## 2017-04-15 ENCOUNTER — Encounter (INDEPENDENT_AMBULATORY_CARE_PROVIDER_SITE_OTHER): Payer: Self-pay

## 2017-04-15 ENCOUNTER — Ambulatory Visit (INDEPENDENT_AMBULATORY_CARE_PROVIDER_SITE_OTHER): Payer: Commercial Managed Care - PPO | Admitting: Interventional Cardiology

## 2017-04-15 VITALS — BP 120/74 | HR 84 | Ht 77.0 in | Wt 273.0 lb

## 2017-04-15 DIAGNOSIS — E118 Type 2 diabetes mellitus with unspecified complications: Secondary | ICD-10-CM | POA: Diagnosis not present

## 2017-04-15 DIAGNOSIS — I10 Essential (primary) hypertension: Secondary | ICD-10-CM

## 2017-04-15 DIAGNOSIS — I2510 Atherosclerotic heart disease of native coronary artery without angina pectoris: Secondary | ICD-10-CM | POA: Diagnosis not present

## 2017-04-15 DIAGNOSIS — E784 Other hyperlipidemia: Secondary | ICD-10-CM | POA: Diagnosis not present

## 2017-04-15 DIAGNOSIS — E7849 Other hyperlipidemia: Secondary | ICD-10-CM

## 2017-04-15 NOTE — Patient Instructions (Signed)
Medication Instructions:  None  Labwork: None  Testing/Procedures: Your physician has requested that you have an exercise tolerance test anytime prior to one year follow up. For further information please visit [email or web address]. Please also follow instruction sheet, as given.   Follow-Up: Your physician wants you to follow-up in: 1 year with Dr.Smith.  You will receive a reminder letter in the mail two months in advance. If you don't receive a letter, please call our office to schedule the follow-up appointment.   Any Other Special Instructions Will Be Listed Below (If Applicable).     If you need a refill on your cardiac medications before your next appointment, please call your pharmacy.

## 2017-04-15 NOTE — Progress Notes (Signed)
Cardiology Office Note    Date:  04/15/2017   ID:  Henry Rivera, DOB 03-17-1956, MRN [id number]  PCP:  Heinz Knuckles, MD  Cardiologist: Sinclair Grooms, MD   Chief Complaint  Patient presents with  . Coronary Artery Disease    History of Present Illness:  Henry Rivera is a 61 y.o. male who presents for CAD with LAD DES, DM II with hemoglobin A1c typically less than 6.5, hyperlipidemia with LDL 23 03/2017, and essential hypertension.  He is doing well. He is physically active. 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 Gets more than 10,000 steps daily. In the mist of physical activity he has no complaints of dyspnea, chest discomfort, unusual fatigue, palpitations, or other complaints. Has not had syncope. No specific medication side effects have been noted.  Diabetes has been under reasonable control. A1c was 6.3 when last checked within the past month.  No exertion related leg discomfort.   Past Medical History:  Diagnosis Date  . Allergic rhinitis   . Allergy   . CAD (coronary artery disease) 4/12   WITH LAD DES   . Diabetes ([place]) 2007  . Elevated cholesterol    pt denies this 11-11-15- states his cholesterol is fine   . GERD (gastroesophageal reflux disease)   . Hyperlipidemia   . Hypertension   . MI (mitral incompetence)   . Vitamin D deficiency     Past Surgical History:  Procedure Laterality Date  . COLONOSCOPY  03-27-2004  . CORONARY ANGIOPLASTY WITH STENT PLACEMENT  11-2010  . VASECTOMY      Current Medications: Outpatient Medications Prior to Visit  Medication Sig Dispense Refill  . aspirin 81 MG tablet Take 81 mg by mouth daily.    . Cholecalciferol (VITAMIN D PO) Take by mouth. 1000 units once daily    . Dulaglutide (TRULICITY) 2.20 UR/4.2HC SOPN Inject 0.75 mg into the skin once a week. 4 pen 3  . esomeprazole (NEXIUM) 40 MG capsule Take 40 mg by mouth daily at 12 noon.    . fexofenadine (ALLEGRA) 180 MG tablet  Take 180 mg by mouth daily.    . fluticasone (FLONASE) 50 MCG/ACT nasal spray Place 2 sprays into both nostrils as needed for allergies or rhinitis.    Marland Kitchen irbesartan (AVAPRO) 300 MG tablet Take 300 mg by mouth daily.    . metFORMIN (GLUCOPHAGE) 1000 MG tablet Take 1,000 mg by mouth 2 (two) times daily with a meal.    . nitroGLYCERIN (NITROSTAT) 0.4 MG SL tablet Place 1 tablet (0.4 mg total) under the tongue every 5 (five) minutes as needed for chest pain. 25 tablet 0  . rosuvastatin (CRESTOR) 5 MG tablet TAKE 1 TABLET EVERY OTHER DAY ORALLY  1  . GuaiFENesin (COUGH SYRUP PO) Take by mouth. Takes as needed     No facility-administered medications prior to visit.      Allergies:   Augmentin [amoxicillin-pot clavulanate]   Social History   Social History  . Marital status: Married    Spouse name: N/A  . Number of children: N/A  . Years of education: N/A   Social History Main Topics  . Smoking status: Never Smoker  . Smokeless tobacco: Never Used  . Alcohol use No  . Drug use: No  . Sexual activity: Not Asked   Other Topics Concern  . None   Social History Narrative  . None     Family History:  The patient's  family history includes Diabetes in his paternal grandfather; Heart failure in his paternal grandfather; Hepatitis in his brother.   ROS:   Please see the history of present illness.    Right knee discomfort and occasional back discomfort. No medication side effects. Decreased vision.  All other systems reviewed and are negative.   PHYSICAL EXAM:   VS:  BP 120/74 (BP Location: Left Arm)   Pulse 84   Ht 6\' 5"  (1.956 m)   Wt 273 lb (123.8 kg)   BMI 32.37 kg/m    GEN: Well nourished, well developed, in no acute distress  HEENT: normal  Neck: no JVD, carotid bruits, or masses Cardiac: RRR; no murmurs, rubs, or gallops,no edema  Respiratory:  clear to auscultation bilaterally, normal work of breathing GI: soft, nontender, nondistended, + BS MS: no deformity or  atrophy  Skin: warm and dry, no rash Neuro:  Alert and Oriented x 3, Strength and sensation are intact Psych: euthymic mood, full affect  Wt Readings from Last 3 Encounters:  04/15/17 273 lb (123.8 kg)  03/04/17 271 lb (122.9 kg)  12/03/16 271 lb 9.6 oz (123.2 kg)      Studies/Labs Reviewed:   EKG:  EKG  Normal sinus rhythm with normal appearance  Recent Labs: 07/07/2016: Hemoglobin 15.6; Platelets 173 03/01/2017: BUN 18; Creatinine, Ser 1.19; Potassium 4.4; Sodium 135   Lipid Panel No results found for: CHOL, TRIG, HDL, CHOLHDL, VLDL, LDLCALC, LDLDIRECT  Additional studies/ records that were reviewed today include:  No functional testing since an echocardiogram in 2016. Never had a stress test following coronary stenting.    ASSESSMENT:    1. Coronary artery disease involving native coronary artery of native heart without angina pectoris   2. Essential hypertension   3. Other hyperlipidemia   4. Multiple complications of type II diabetes mellitus ([place])      PLAN:  In order of problems listed above:  1. Doing well without angina. No episodes of unexplained dyspnea. Very active without limitations. Exercise treadmill test will be performed, since we are 6-7 years status post LAD stent. 2. Target blood pressure 140/90 mmHg or less. Continue current therapy. 3. LDL cholesterol target less than 70. 4. Continue aggressive management of diabetes. Recent A1c 6.3.  Overall, doing well. Clinical follow-up in one year. Exercise treadmill test between now and then to establish baseline for future follow-up in this diabetic with hypertension and hyperlipidemia.    Medication Adjustments/Labs and Tests Ordered: Current medicines are reviewed at length with the patient today.  Concerns regarding medicines are outlined above.  Medication changes, Labs and Tests ordered today are listed in the Patient Instructions below. Patient Instructions  Medication Instructions:   None  Labwork: None  Testing/Procedures: Your physician has requested that you have an exercise tolerance test anytime prior to one year follow up. For further information please visit [email or web address]. Please also follow instruction sheet, as given.   Follow-Up: Your physician wants you to follow-up in: 1 year with Dr.Smith.  You will receive a reminder letter in the mail two months in advance. If you don't receive a letter, please call our office to schedule the follow-up appointment.   Any Other Special Instructions Will Be Listed Below (If Applicable).     If you need a refill on your cardiac medications before your next appointment, please call your pharmacy.      Signed, Sinclair Grooms, MD  04/15/2017 9:26 AM    [address] [address] [place], [place]  [address] Phone: [phone]; Fax: [phone]

## 2017-06-02 ENCOUNTER — Other Ambulatory Visit: Payer: Self-pay | Admitting: Endocrinology

## 2017-07-04 ENCOUNTER — Other Ambulatory Visit (INDEPENDENT_AMBULATORY_CARE_PROVIDER_SITE_OTHER): Payer: Commercial Managed Care - PPO

## 2017-07-04 DIAGNOSIS — E669 Obesity, unspecified: Secondary | ICD-10-CM | POA: Diagnosis not present

## 2017-07-04 DIAGNOSIS — E1169 Type 2 diabetes mellitus with other specified complication: Secondary | ICD-10-CM | POA: Diagnosis not present

## 2017-07-04 LAB — BASIC METABOLIC PANEL
BUN: 20 mg/dL (ref 6–23)
CALCIUM: 9.6 mg/dL (ref 8.4–10.5)
CHLORIDE: 105 meq/L (ref 96–112)
CO2: 26 meq/L (ref 19–32)
CREATININE: 1.12 mg/dL (ref 0.40–1.50)
GFR: 70.73 mL/min (ref >60.00)
Glucose, Bld: 147 mg/dL — ABNORMAL HIGH (ref 70–99)
Potassium: 4.3 mEq/L (ref 3.5–5.1)
Sodium: 139 mEq/L (ref 135–145)

## 2017-07-04 LAB — HEMOGLOBIN A1C: Hgb A1c MFr Bld: 6.8 % — ABNORMAL HIGH (ref 4.6–6.5)

## 2017-07-07 ENCOUNTER — Ambulatory Visit: Payer: Commercial Managed Care - PPO | Admitting: Endocrinology

## 2017-07-21 ENCOUNTER — Ambulatory Visit: Payer: Commercial Managed Care - PPO | Admitting: Endocrinology

## 2017-07-21 ENCOUNTER — Encounter: Payer: Self-pay | Admitting: Endocrinology

## 2017-07-21 VITALS — BP 122/72 | HR 91 | Ht 77.0 in | Wt 273.0 lb

## 2017-07-21 DIAGNOSIS — E1165 Type 2 diabetes mellitus with hyperglycemia: Secondary | ICD-10-CM | POA: Diagnosis not present

## 2017-07-21 NOTE — Patient Instructions (Addendum)
Check blood sugars on waking up  3/7  Also check blood sugars about 2 hours after a meal and do this after different meals by rotation  Recommended blood sugar levels on waking up is 90-130 and about 2 hours after meal is 130-160  Please bring your blood sugar monitor to each visit, thank you  Ozempic 0.25 for 2 shots then 0.5

## 2017-07-21 NOTE — Progress Notes (Signed)
Patient ID: Henry Rivera, male   DOB: May 10, 1956, 61 y.o.   MRN: [id number]            Reason for Appointment: Follow-up for Type 2 Diabetes    History of Present Illness:          Date of diagnosis of type 2 diabetes mellitus: 2007      Background history:   His A1c was apparently 12.7 at the time of diagnosis when he had increased thirst and urination He was started on Janumet and apparently continued unchanged until last year, this was stopped because of high out-of-pocket expense He was subsequently on metformin only and then Jardiance 10 mg started His A1c in 2017 was 6.7 twice, previous records not available.  Recent history:       Non-insulin hypoglycemic drugs the patient is taking are: Trulicity 5.00 mg weekly,metformin 1 g twice a day   His A1c in 1/18 was 8.3 and recently better, now 6.8  Current management, blood sugar patterns and problems identified:  He has not much change in his home blood sugar readings recently although his A1c is higher  He thinks that he has been traveling more, eating out and also not able to exercise or walk as much as usual  However has not had any significant weight change  Does not check blood sugars after breakfast and lunch is usually an highest reading after dinner recently is only 169  He still comfortable with the benefits of Trulicity although has not lost any further weight  Also fasting readings are mildly increased and overall despite taking full dose metformin       Side effects from medications have been: None  Compliance with the medical regimen: Usually good Hypoglycemia: none   Glucose monitoring:  done 1 times a day         Glucometer: One Touch ultra mini .      Blood Glucose readings by download as follows  Mean values apply above for all meters except median for One Touch  PRE-MEAL Fasting Lunch Dinner Bedtime Overall  Glucose range: 104-156    85-1 69    Mean/median: 131    129 131   Self-care: The  diet that the patient has been following is: tries to limit high-fat foods .                  Dietician visit, most recent: none, previously attended classes at cardiac rehabilitation               Exercise:  Walking less recently, Usually walking 3 miles almost daily     Weight history: Maximum weight 314  Wt Readings from Last 3 Encounters:  07/21/17 273 lb (123.8 kg)  04/15/17 273 lb (123.8 kg)  03/04/17 271 lb (122.9 kg)    Glycemic control:    Lab Results  Component Value Date   HGBA1C 6.8 (H) 07/04/2017   HGBA1C 6.4 03/01/2017   HGBA1C 7.4 (H) 11/23/2016   Lab Results  Component Value Date   CREATININE 1.12 07/04/2017   No results found for: MICRALBCREAT  Last microalbumin/creatinine ratio normal in 09/2015  Other active problems: See review of systems   Allergies as of 07/21/2017      Reactions   Augmentin [amoxicillin-pot Clavulanate]    Gi sx      Medication List        Accurate as of 07/21/17  9:09 PM. Always use your most recent med list.  aspirin 81 MG tablet Take 81 mg by mouth daily.   esomeprazole 40 MG capsule Commonly known as:  NEXIUM Take 40 mg by mouth daily at 12 noon.   fexofenadine 180 MG tablet Commonly known as:  ALLEGRA Take 180 mg by mouth daily.   FLONASE 50 MCG/ACT nasal spray Generic drug:  fluticasone Place 2 sprays into both nostrils as needed for allergies or rhinitis.   irbesartan 300 MG tablet Commonly known as:  AVAPRO Take 300 mg by mouth daily.   metFORMIN 1000 MG tablet Commonly known as:  GLUCOPHAGE Take 1,000 mg by mouth 2 (two) times daily with a meal.   nitroGLYCERIN 0.4 MG SL tablet Commonly known as:  NITROSTAT Place 1 tablet (0.4 mg total) under the tongue every 5 (five) minutes as needed for chest pain.   rosuvastatin 5 MG tablet Commonly known as:  CRESTOR TAKE 1 TABLET EVERY OTHER DAY ORALLY   TRULICITY 9.61 YO/3.7CH Sopn Generic drug:  Dulaglutide INJECT 0.75 MG INTO THE SKIN ONCE  A WEEK.   VITAMIN D PO Take by mouth. 1000 units once daily       Allergies:  Allergies  Allergen Reactions  . Augmentin [Amoxicillin-Pot Clavulanate]     Gi sx    Past Medical History:  Diagnosis Date  . Allergic rhinitis   . Allergy   . CAD (coronary artery disease) 4/12   WITH LAD DES   . Diabetes ([place]) 2007  . Elevated cholesterol    pt denies this 11-11-15- states his cholesterol is fine   . GERD (gastroesophageal reflux disease)   . Hyperlipidemia   . Hypertension   . MI (mitral incompetence)   . Vitamin D deficiency     Past Surgical History:  Procedure Laterality Date  . COLONOSCOPY  03-27-2004  . CORONARY ANGIOPLASTY WITH STENT PLACEMENT  11-2010  . VASECTOMY      Family History  Problem Relation Age of Onset  . Hepatitis Brother   . Diabetes Paternal Grandfather   . Heart failure Paternal Grandfather        CHF  . Colon cancer Neg Hx   . Colon polyps Neg Hx     Social History:  reports that  has never smoked. he has never used smokeless tobacco. He reports that he does not drink alcohol or use drugs.   Review of Systems   Lipid history:  Baseline cholesterol has been normal, started on Crestor for history of CAD, currently only on 5 mg Every other day as prescribed by PCP Followed by PCP His last LDL was 23 And triglycerides 82   No results found for: CHOL, HDL, LDLCALC, LDLDIRECT, TRIG, CHOLHDL         Hypertension: Treated with Avapro 300 mg, has had long-standing hypertension and is followed by PCP  Most recent eye exam was 5/18  Most recent foot exam: 2/18    LABS:  No visits with results within 1 Week(s) from this visit.  Latest known visit with results is:  Lab on 07/04/2017  Component Date Value Ref Range Status  . Sodium 07/04/2017 139  135 - 145 mEq/L Final  . Potassium 07/04/2017 4.3  3.5 - 5.1 mEq/L Final  . Chloride 07/04/2017 105  96 - 112 mEq/L Final  . CO2 07/04/2017 26  19 - 32 mEq/L Final  . Glucose, Bld 07/04/2017  147* 70 - 99 mg/dL Final  . BUN 07/04/2017 20  6 - 23 mg/dL Final  . Creatinine, Ser 07/04/2017 1.12  0.40 - 1.50 mg/dL Final  . Calcium 07/04/2017 9.6  8.4 - 10.5 mg/dL Final  . GFR 07/04/2017 70.73  >60.00 mL/min Final  . Hgb A1c MFr Bld 07/04/2017 6.8* 4.6 - 6.5 % Final   Glycemic Control Guidelines for People with Diabetes:Non Diabetic:  <6%Goal of Therapy: <7%Additional Action Suggested:  >8%     Physical Examination:  BP 122/72 (BP Location: Right Arm, Patient Position: Sitting, Cuff Size: Normal)   Pulse 91   Ht 6\' 5"  (1.956 m)   Wt 273 lb (123.8 kg)   SpO2 98%   BMI 32.37 kg/m    ASSESSMENT:  Diabetes type 2, uncontrolled     See history of present illness for detailed discussion of current diabetes management, blood sugar patterns and problems identified  His A1c is 6.8 and was relatively higher than before He thinks his level of control is not as good because of difficulty being consistent with diet and his walking program However his fasting readings are relatively high compared to postprandial readings at night and as high as 156 at home  Since he had nausea with 1.5 mg Trulicity is comfortable continuing the same dose   PLAN:    Discussed use of Ozempic as a better GLP-1 drug for improvement in weight loss and more consistent control Showed him how to use the Ozempic pen He will use 0.25 mg for the first 2 weeks and then 0.5 if no nausea He will request a prescription when he is out of his Trulicity Continue metformin He will try to be improving diet and exercise regimen at least after the holidays  Patient Instructions  Check blood sugars on waking up  3/7  Also check blood sugars about 2 hours after a meal and do this after different meals by rotation  Recommended blood sugar levels on waking up is 90-130 and about 2 hours after meal is 130-160  Please bring your blood sugar monitor to each visit, thank you  Ozempic 0.25 for 2 shots then  0.5     Elayne Snare 07/21/2017, 9:09 PM   Note: This office note was prepared with Dragon voice recognition system technology. Any transcriptional errors that result from this process are unintentional.

## 2017-08-01 ENCOUNTER — Telehealth: Payer: Self-pay | Admitting: Endocrinology

## 2017-08-01 ENCOUNTER — Other Ambulatory Visit: Payer: Self-pay | Admitting: Endocrinology

## 2017-08-01 ENCOUNTER — Other Ambulatory Visit: Payer: Self-pay

## 2017-08-01 MED ORDER — SEMAGLUTIDE(0.25 OR 0.5MG/DOS) 2 MG/1.5ML ~~LOC~~ SOPN: 0.5000 mg | PEN_INJECTOR | SUBCUTANEOUS | 2 refills | Status: DC

## 2017-08-01 NOTE — Telephone Encounter (Signed)
This has been sent.  He will take 0.25 mg weekly for the first 2 shots and then 0.5 mg weekly

## 2017-08-01 NOTE — Telephone Encounter (Signed)
Please send new prescription for Ozempic.

## 2017-08-01 NOTE — Telephone Encounter (Signed)
Patient needs RX for Once Weekly [facility]pic [facility] to [facility] [address] asap

## 2017-08-18 DIAGNOSIS — J209 Acute bronchitis, unspecified: Secondary | ICD-10-CM | POA: Diagnosis not present

## 2017-08-18 DIAGNOSIS — J0100 Acute maxillary sinusitis, unspecified: Secondary | ICD-10-CM | POA: Diagnosis not present

## 2017-09-06 ENCOUNTER — Telehealth: Payer: Self-pay | Admitting: Endocrinology

## 2017-09-06 ENCOUNTER — Telehealth: Payer: Self-pay

## 2017-09-06 NOTE — Telephone Encounter (Signed)
Pt stated he started Semaglutide (OZEMPIC) 0.25 or 0.5 MG/DOSE SOPN States that when dr doubled it that it is making him sick.

## 2017-09-06 NOTE — Telephone Encounter (Signed)
Made a note, I presume he is not having stomach discomfort with 0.25 mg and he can continue

## 2017-09-06 NOTE — Telephone Encounter (Signed)
Henry Rivera came in our office today and he stated that the 0.5 of the Ozempic was hurting his stomach and making him sick so he is currently taking the 0.25 dose until his appointment with you in March. He also stated that he did not take Ozempic for a week and his blood sugar readings were the best they have been in a while on the week he did not take this. He wanted you to know.

## 2017-09-28 DIAGNOSIS — E559 Vitamin D deficiency, unspecified: Secondary | ICD-10-CM | POA: Diagnosis not present

## 2017-09-28 DIAGNOSIS — Z125 Encounter for screening for malignant neoplasm of prostate: Secondary | ICD-10-CM | POA: Diagnosis not present

## 2017-09-28 DIAGNOSIS — E785 Hyperlipidemia, unspecified: Secondary | ICD-10-CM | POA: Diagnosis not present

## 2017-09-28 DIAGNOSIS — Z0000 Encounter for general adult medical examination without abnormal findings: Secondary | ICD-10-CM | POA: Diagnosis not present

## 2017-09-28 DIAGNOSIS — N182 Chronic kidney disease, stage 2 (mild): Secondary | ICD-10-CM | POA: Diagnosis not present

## 2017-09-28 DIAGNOSIS — I10 Essential (primary) hypertension: Secondary | ICD-10-CM | POA: Diagnosis not present

## 2017-09-28 DIAGNOSIS — E1122 Type 2 diabetes mellitus with diabetic chronic kidney disease: Secondary | ICD-10-CM | POA: Diagnosis not present

## 2017-10-05 DIAGNOSIS — N182 Chronic kidney disease, stage 2 (mild): Secondary | ICD-10-CM | POA: Diagnosis not present

## 2017-10-05 DIAGNOSIS — J309 Allergic rhinitis, unspecified: Secondary | ICD-10-CM | POA: Diagnosis not present

## 2017-10-05 DIAGNOSIS — E1142 Type 2 diabetes mellitus with diabetic polyneuropathy: Secondary | ICD-10-CM | POA: Diagnosis not present

## 2017-10-17 ENCOUNTER — Other Ambulatory Visit: Payer: Commercial Managed Care - PPO

## 2017-10-20 ENCOUNTER — Encounter: Payer: Self-pay | Admitting: Endocrinology

## 2017-10-20 ENCOUNTER — Ambulatory Visit: Payer: Commercial Managed Care - PPO | Admitting: Endocrinology

## 2017-10-20 VITALS — BP 130/84 | HR 84 | Wt 276.0 lb

## 2017-10-20 DIAGNOSIS — E1165 Type 2 diabetes mellitus with hyperglycemia: Secondary | ICD-10-CM | POA: Diagnosis not present

## 2017-10-20 NOTE — Progress Notes (Signed)
Patient ID: Henry Rivera, male   DOB: 29-Apr-1956, 62 y.o.   MRN: [id number]            Reason for Appointment: Follow-up for Type 2 Diabetes    History of Present Illness:          Date of diagnosis of type 2 diabetes mellitus: 2007      Background history:   His A1c was apparently 12.7 at the time of diagnosis when he had increased thirst and urination He was started on Janumet and apparently continued unchanged until last year, this was stopped because of high out-of-pocket expense He was subsequently on metformin only and then Jardiance 10 mg started His A1c in 2017 was 6.7 twice, previous records not available.  Recent history:       Non-insulin hypoglycemic drugs the patient is taking are: Ozempic 0.25 mg weekly,metformin 1 g twice a day   His A1c in 09/2017 from PCP was 6.8, has been as high as 8.3   Current management, blood sugar patterns and problems identified:  Since his last visit he has been on [place] instead of Trulicity since he was not having consistent weight loss and some variability in blood sugars at home on Trulicity  However he says that he has been having more consistent nausea with Ozempic even with trying 0.25 mg weekly.  Also his nausea was still persistent on 1.5 mg Trulicity in the past  For various reasons he has not taken his Trulicity or Ozempic in the last 2 weeks and his blood sugars are relatively higher  Also has had variability in his exercise level because of musculoskeletal problems although he was walking more regularly when he was in [place]  His blood sugars have been as high as 260 after meals and significantly higher compared to his last visit  Also FASTING blood sugars are averaging about 20 mg higher than before  He does take metformin regularly       Side effects from medications have been: None  Compliance with the medical regimen: Usually good Hypoglycemia: none   Glucose monitoring:  done 1 times a day          Glucometer: One Touch ultra mini .      Blood Glucose readings by download as follows  Mean values apply above for all meters except median for One Touch  PRE-MEAL Fasting Lunch Dinner Bedtime Overall  Glucose range:  109-211      Mean/median:  155    157   POST-MEAL PC Breakfast PC Lunch PC Dinner  Glucose range:    126-260  Mean/median:    171    Self-care: The diet that the patient has been following is: tries to limit high-fat foods .                  Dietician visit, most recent: none, previously attended classes at cardiac rehabilitation               Exercise:  Walking less recently, was previously walking 3 or more miles almost daily     Weight history: Maximum weight 314  Wt Readings from Last 3 Encounters:  10/20/17 276 lb (125.2 kg)  07/21/17 273 lb (123.8 kg)  04/15/17 273 lb (123.8 kg)    Glycemic control:    Lab Results  Component Value Date   HGBA1C 6.8 (H) 07/04/2017   HGBA1C 6.4 03/01/2017   HGBA1C 7.4 (H) 11/23/2016   Lab Results  Component Value Date  CREATININE 1.12 07/04/2017   No results found for: MICRALBCREAT  Last microalbumin/creatinine ratio normal in 09/2015  Other active problems: See review of systems   Allergies as of 10/20/2017      Reactions   Augmentin [amoxicillin-pot Clavulanate]    Gi sx      Medication List        Accurate as of 10/20/17  8:38 AM. Always use your most recent med list.          aspirin 81 MG tablet Take 81 mg by mouth daily.   esomeprazole 40 MG capsule Commonly known as:  NEXIUM Take 40 mg by mouth daily at 12 noon.   fexofenadine 180 MG tablet Commonly known as:  ALLEGRA Take 180 mg by mouth daily.   FLONASE 50 MCG/ACT nasal spray Generic drug:  fluticasone Place 2 sprays into both nostrils as needed for allergies or rhinitis.   irbesartan 300 MG tablet Commonly known as:  AVAPRO Take 300 mg by mouth daily.   metFORMIN 1000 MG tablet Commonly known as:  GLUCOPHAGE Take 1,000 mg by  mouth 2 (two) times daily with a meal.   nitroGLYCERIN 0.4 MG SL tablet Commonly known as:  NITROSTAT Place 1 tablet (0.4 mg total) under the tongue every 5 (five) minutes as needed for chest pain.   rosuvastatin 5 MG tablet Commonly known as:  CRESTOR TAKE 1 TABLET EVERY OTHER DAY ORALLY   Semaglutide 0.25 or 0.5 MG/DOSE Sopn Commonly known as:  OZEMPIC Inject 0.5 mg into the skin once a week.   VITAMIN D PO Take by mouth. 1000 units once daily       Allergies:  Allergies  Allergen Reactions  . Augmentin [Amoxicillin-Pot Clavulanate]     Gi sx    Past Medical History:  Diagnosis Date  . Allergic rhinitis   . Allergy   . CAD (coronary artery disease) 4/12   WITH LAD DES   . Diabetes ([place]) 2007  . Elevated cholesterol    pt denies this 11-11-15- states his cholesterol is fine   . GERD (gastroesophageal reflux disease)   . Hyperlipidemia   . Hypertension   . MI (mitral incompetence)   . Vitamin D deficiency     Past Surgical History:  Procedure Laterality Date  . COLONOSCOPY  03-27-2004  . CORONARY ANGIOPLASTY WITH STENT PLACEMENT  11-2010  . VASECTOMY      Family History  Problem Relation Age of Onset  . Hepatitis Brother   . Diabetes Paternal Grandfather   . Heart failure Paternal Grandfather        CHF  . Colon cancer Neg Hx   . Colon polyps Neg Hx     Social History:  reports that he has never smoked. He has never used smokeless tobacco. He reports that he does not drink alcohol or use drugs.   Review of Systems   Lipid history:  Baseline cholesterol has been normal, started on Crestor for history of CAD, currently only on 5 mg Every other day as prescribed by PCP Followed by PCP  His last LDL was 24   No results found for: CHOL, HDL, LDLCALC, LDLDIRECT, TRIG, CHOLHDL         Hypertension: Treated with Avapro 300 mg, has had long-standing hypertension and is followed by PCP, blood pressure was 108/80 earlier this month  Most recent eye exam  was 5/18  Most recent foot exam: 2/18    LABS:  No visits with results within 1 Week(s) from  this visit.  Latest known visit with results is:  Lab on 07/04/2017  Component Date Value Ref Range Status  . Sodium 07/04/2017 139  135 - 145 mEq/L Final  . Potassium 07/04/2017 4.3  3.5 - 5.1 mEq/L Final  . Chloride 07/04/2017 105  96 - 112 mEq/L Final  . CO2 07/04/2017 26  19 - 32 mEq/L Final  . Glucose, Bld 07/04/2017 147* 70 - 99 mg/dL Final  . BUN 07/04/2017 20  6 - 23 mg/dL Final  . Creatinine, Ser 07/04/2017 1.12  0.40 - 1.50 mg/dL Final  . Calcium 07/04/2017 9.6  8.4 - 10.5 mg/dL Final  . GFR 07/04/2017 70.73  >60.00 mL/min Final  . Hgb A1c MFr Bld 07/04/2017 6.8* 4.6 - 6.5 % Final   Glycemic Control Guidelines for People with Diabetes:Non Diabetic:  <6%Goal of Therapy: <7%Additional Action Suggested:  >8%     Physical Examination:  BP 130/84 (BP Location: Left Arm, Patient Position: Sitting, Cuff Size: Large)   Pulse 84   Wt 276 lb (125.2 kg)   SpO2 98%   BMI 32.73 kg/m    ASSESSMENT:  Diabetes type 2, on GLP-1 drugs     See history of present illness for detailed discussion of current diabetes management, blood sugar patterns and problems identified  His A1c is 6.8 and about the same as before His main difficulty is being consistent with exercise and also variability in diet especially with his traveling frequently He has not been able to tolerate Ozempic which also has not improved his control or weight, however has been only taking 0.25 mg weekly which still causes nausea He is fairly regularly checking blood sugars in the mornings and after supper but recently these are higher because of leaving off the Ozempic   PLAN:    Since he is not able to tolerate Ozempic he will need to go back to Trulicity when he finishes this He will need to go back to 0.75 mg weekly once he is out of Ozempic He will try to get back into walking when he can also To call if he has  persistently high blood sugars, may consider adding low-dose Amaryl at dinnertime also   There are no Patient Instructions on file for this visit.    Henry Rivera 10/20/2017, 8:38 AM   Note: This office note was prepared with Dragon voice recognition system technology. Any transcriptional errors that result from this process are unintentional.

## 2017-10-20 NOTE — Patient Instructions (Signed)
Check blood sugars on waking up 3/7   Also check blood sugars about 2 hours after a meal and do this after different meals by rotation  Recommended blood sugar levels on waking up is 90-130 and about 2 hours after meal is 130-160  Please bring your blood sugar monitor to each visit, thank you  

## 2017-10-25 ENCOUNTER — Ambulatory Visit (INDEPENDENT_AMBULATORY_CARE_PROVIDER_SITE_OTHER): Payer: Commercial Managed Care - PPO

## 2017-10-25 DIAGNOSIS — I2510 Atherosclerotic heart disease of native coronary artery without angina pectoris: Secondary | ICD-10-CM

## 2017-10-25 LAB — EXERCISE TOLERANCE TEST
CHL RATE OF PERCEIVED EXERTION: 17
CSEPED: 7 min
CSEPEDS: 2 s
CSEPEW: 8.5 METS
MPHR: 159 {beats}/min
Peak HR: 160 {beats}/min
Percent HR: 100 %
Rest HR: 77 {beats}/min

## 2017-10-27 ENCOUNTER — Telehealth: Payer: Self-pay | Admitting: *Deleted

## 2017-10-27 DIAGNOSIS — I10 Essential (primary) hypertension: Secondary | ICD-10-CM

## 2017-10-27 MED ORDER — HYDROCHLOROTHIAZIDE 12.5 MG PO CAPS: 12.5000 mg | ORAL_CAPSULE | Freq: Every day | ORAL | 3 refills | Status: DC

## 2017-10-27 NOTE — Telephone Encounter (Signed)
-----   Message from Belva Crome, MD sent at 10/26/2017  3:18 PM EDT ----- Let the patient know everything looks good except blood pressure gets extremely high with physical activity.  Start HCTZ 12.5 mg daily.  Bmet in 7-10 days.  Monitor blood pressure and notify us if less than 100/60 mmHg. A copy will be sent to Thressa Sheller, MD

## 2017-10-27 NOTE — Telephone Encounter (Signed)
Spoke with pt and went over results and recommendations per Dr. Tamala Julian.  Pt will have labs on 4/9.  Pt verbalized understanding and was in agreement with this plan.  Pt appreciative for call.

## 2017-10-28 ENCOUNTER — Telehealth: Payer: Self-pay | Admitting: Interventional Cardiology

## 2017-10-28 NOTE — Telephone Encounter (Signed)
New message    Pt has question about his medication. He said he was told to start another medication but needs to know if he continues the medication he has been taking.

## 2017-10-28 NOTE — Telephone Encounter (Signed)
Spoke with pt and advised he is to continue all medications the same as before, we are just adding the HCTZ.  Pt very appreciative for call.

## 2017-11-08 ENCOUNTER — Other Ambulatory Visit: Payer: Commercial Managed Care - PPO

## 2017-11-08 DIAGNOSIS — I10 Essential (primary) hypertension: Secondary | ICD-10-CM

## 2017-11-08 LAB — BASIC METABOLIC PANEL
BUN / CREAT RATIO: 15 (ref 10–24)
BUN: 19 mg/dL (ref 8–27)
CO2: 22 mmol/L (ref 20–29)
CREATININE: 1.23 mg/dL (ref 0.76–1.27)
Calcium: 9.5 mg/dL (ref 8.6–10.2)
Chloride: 97 mmol/L (ref 96–106)
GFR, EST AFRICAN AMERICAN: 73 mL/min/{1.73_m2} (ref >59)
GFR, EST NON AFRICAN AMERICAN: 63 mL/min/{1.73_m2} (ref >59)
GLUCOSE: 274 mg/dL — AB (ref 65–99)
Potassium: 4.3 mmol/L (ref 3.5–5.2)
SODIUM: 137 mmol/L (ref 134–144)

## 2017-11-22 DIAGNOSIS — N182 Chronic kidney disease, stage 2 (mild): Secondary | ICD-10-CM | POA: Diagnosis not present

## 2017-11-22 DIAGNOSIS — E1122 Type 2 diabetes mellitus with diabetic chronic kidney disease: Secondary | ICD-10-CM | POA: Diagnosis not present

## 2017-11-22 DIAGNOSIS — I209 Angina pectoris, unspecified: Secondary | ICD-10-CM | POA: Diagnosis not present

## 2017-12-05 DIAGNOSIS — S39012A Strain of muscle, fascia and tendon of lower back, initial encounter: Secondary | ICD-10-CM | POA: Diagnosis not present

## 2017-12-05 DIAGNOSIS — I129 Hypertensive chronic kidney disease with stage 1 through stage 4 chronic kidney disease, or unspecified chronic kidney disease: Secondary | ICD-10-CM | POA: Diagnosis not present

## 2017-12-16 DIAGNOSIS — M25511 Pain in right shoulder: Secondary | ICD-10-CM | POA: Diagnosis not present

## 2017-12-21 ENCOUNTER — Telehealth: Payer: Self-pay | Admitting: Endocrinology

## 2017-12-21 NOTE — Telephone Encounter (Signed)
Patient was advised once finished with Ozempic to give [place] a call so Dr Dwyane Dee could change his medication back over to the trulicity. Patient stated he was only taking 1/2 dose of the trulicity  [id number] [place], [place].  Please advise

## 2017-12-21 NOTE — Telephone Encounter (Signed)
Please send 0.63 mg Trulicity weekly, 4 pens per month

## 2017-12-21 NOTE — Telephone Encounter (Signed)
Please advise 

## 2017-12-22 ENCOUNTER — Other Ambulatory Visit: Payer: Self-pay

## 2017-12-22 MED ORDER — DULAGLUTIDE 0.75 MG/0.5ML ~~LOC~~ SOAJ: 0.7500 mg | SUBCUTANEOUS | 3 refills | Status: DC

## 2017-12-22 NOTE — Telephone Encounter (Signed)
This medication has been sent. 

## 2017-12-30 DIAGNOSIS — E119 Type 2 diabetes mellitus without complications: Secondary | ICD-10-CM | POA: Diagnosis not present

## 2017-12-30 DIAGNOSIS — H2513 Age-related nuclear cataract, bilateral: Secondary | ICD-10-CM | POA: Diagnosis not present

## 2017-12-30 DIAGNOSIS — H25013 Cortical age-related cataract, bilateral: Secondary | ICD-10-CM | POA: Diagnosis not present

## 2017-12-30 LAB — HM DIABETES EYE EXAM

## 2018-01-04 ENCOUNTER — Telehealth: Payer: Self-pay | Admitting: Endocrinology

## 2018-01-04 NOTE — Telephone Encounter (Signed)
Trulicity only comes in 0.75 mg or 1.5 mg dosage

## 2018-01-04 NOTE — Telephone Encounter (Signed)
Do you approve?

## 2018-01-04 NOTE — Telephone Encounter (Signed)
The doses are not accurate, please confirm, he is supposed to be on 0.75 mg weekly

## 2018-01-04 NOTE — Telephone Encounter (Signed)
Pt stated that the pens he has been getting are the 0.5mg  pens and that is what he has been using for his weekly dose. He stated that he was changed from Trulicity to [place] and then back to Trulicity and he was experiencing some side effects of "feeling sick". Once patient was informed that his correct dosage was supposed to be 0.75mg  weekly, pt requested to ask Dr. Dwyane Dee if it was okay to stay on the 0.5mg  per week dosage.

## 2018-01-04 NOTE — Telephone Encounter (Addendum)
patient would like for his medication for the Trulicity to be changed from .05 to .25 [id number] - [place], [place]. DEA #:  [id number]

## 2018-01-05 NOTE — Telephone Encounter (Signed)
Pt called and left detailed message to clarify that pt is taking proper medication and proper dosage. Pt was asked on voicemail to double check everything and call this office back when he can with the clarifications.

## 2018-01-05 NOTE — Telephone Encounter (Signed)
He will continue with 0.51 mg Trulicity weekly

## 2018-01-05 NOTE — Telephone Encounter (Signed)
Pt does not have Trulicity and Ozempic confused. He double checked his dosages and confirmed that he is taking the 0.75mg  dosage. He was confused and noted that it said 0.54mL/pen. He states that he would like to stay on this dosage if you approved of this.

## 2018-01-13 DIAGNOSIS — M25512 Pain in left shoulder: Secondary | ICD-10-CM | POA: Diagnosis not present

## 2018-01-20 ENCOUNTER — Other Ambulatory Visit (INDEPENDENT_AMBULATORY_CARE_PROVIDER_SITE_OTHER): Payer: Commercial Managed Care - PPO

## 2018-01-20 DIAGNOSIS — E1165 Type 2 diabetes mellitus with hyperglycemia: Secondary | ICD-10-CM

## 2018-01-20 LAB — COMPREHENSIVE METABOLIC PANEL
ALK PHOS: 55 U/L (ref 39–117)
ALT: 32 U/L (ref 0–53)
AST: 19 U/L (ref 0–37)
Albumin: 4.2 g/dL (ref 3.5–5.2)
BILIRUBIN TOTAL: 0.7 mg/dL (ref 0.2–1.2)
BUN: 16 mg/dL (ref 6–23)
CO2: 24 meq/L (ref 19–32)
Calcium: 9.4 mg/dL (ref 8.4–10.5)
Chloride: 105 mEq/L (ref 96–112)
Creatinine, Ser: 1.26 mg/dL (ref 0.40–1.50)
GFR: 61.63 mL/min (ref >60.00)
GLUCOSE: 155 mg/dL — AB (ref 70–99)
Potassium: 4 mEq/L (ref 3.5–5.1)
SODIUM: 139 meq/L (ref 135–145)
TOTAL PROTEIN: 6.7 g/dL (ref 6.0–8.3)

## 2018-01-20 LAB — HEMOGLOBIN A1C: Hgb A1c MFr Bld: 7.8 % — ABNORMAL HIGH (ref 4.6–6.5)

## 2018-01-20 LAB — MICROALBUMIN / CREATININE URINE RATIO
CREATININE, U: 118.9 mg/dL
MICROALB/CREAT RATIO: 0.6 mg/g (ref 0.0–30.0)
Microalb, Ur: <0.7 mg/dL (ref 0.0–1.9)

## 2018-01-24 NOTE — Progress Notes (Signed)
Patient ID: Henry Rivera, male   DOB: Apr 30, 1956, 62 y.o.   MRN: [id number]            Reason for Appointment: Follow-up for Type 2 Diabetes    History of Present Illness:          Date of diagnosis of type 2 diabetes mellitus: 2007      Background history:   His A1c was apparently 12.7 at the time of diagnosis when he had increased thirst and urination He was started on Janumet and apparently continued unchanged until last year, this was stopped because of high out-of-pocket expense He was subsequently on metformin only and then Jardiance 10 mg started His A1c in 2017 was 6.7 twice, previous records not available.  Recent history:       Non-insulin hypoglycemic drugs the patient is taking are: Trulicity 2.77 mg weekly,metformin 1 g twice a day   His A1c is 09/2017 from PCP was 6.8, has been as high as 8.3   Current management, blood sugar patterns and problems identified:  Since about a month ago he has been back on Trulicity because of tendency to nausea with Ozempic 0.25 mg weekly  He says he had high sugars when he had steroid treatments for at least a couple of weeks in early May, as high as 316  For the last 4 weeks his blood sugars have been excellent with only occasional postprandial readings going up to 200 and also FASTING blood sugars are overall better than the last time with some fluctuation  He does not do readings after breakfast and lunch much but these are appearing to be fairly good the highest 175 early afternoon  For some time he was having a lot of pain chest before he had a steroids and was not doing much walking she is doing up to 5 miles walking daily in the last has lost some weight  He is doing fairly well with trying to cut back on high fat and high carbohydrate foods  Lab glucose was 155 done after eating Cheerios       Side effects from medications have been: None  Compliance with the medical regimen: Usually good Hypoglycemia: none    Glucose monitoring:  done 2 times a day         Glucometer: One Touch ultra mini .      Blood Glucose readings by download as follows  Mean values apply above for all meters except median for One Touch  PRE-MEAL Fasting Lunch Dinner Bedtime Overall  Glucose range:  100-160      Mean/median:  125     129   POST-MEAL PC Breakfast PC Lunch PC Dinner  Glucose range:   98-202  Mean/median:   133   Previous readings:  Mean values apply above for all meters except median for One Touch  PRE-MEAL Fasting Lunch Dinner Bedtime Overall  Glucose range:  109-211      Mean/median:  155    157   POST-MEAL PC Breakfast PC Lunch PC Dinner  Glucose range:    126-260  Mean/median:    171    Self-care: The diet that the patient has been following is: tries to limit high-fat foods .                  Dietician visit, most recent: none, previously attended classes at cardiac rehabilitation               Exercise:  Walking  recently, 3 or more miles almost daily     Weight history: Maximum weight had been 314  Wt Readings from Last 3 Encounters:  01/25/18 269 lb 3.2 oz (122.1 kg)  10/20/17 276 lb (125.2 kg)  07/21/17 273 lb (123.8 kg)    Glycemic control:    Lab Results  Component Value Date   HGBA1C 7.8 (H) 01/20/2018   HGBA1C 6.8 (H) 07/04/2017   HGBA1C 6.4 03/01/2017   Lab Results  Component Value Date   MICROALBUR <0.7 01/20/2018   CREATININE 1.26 01/20/2018   Lab Results  Component Value Date   MICRALBCREAT 0.6 01/20/2018    Last microalbumin/creatinine ratio normal in 09/2015  Other active problems: See review of systems   Allergies as of 01/25/2018      Reactions   Augmentin [amoxicillin-pot Clavulanate]    Gi sx      Medication List        Accurate as of 01/25/18  9:07 AM. Always use your most recent med list.          aspirin 81 MG tablet Take 81 mg by mouth daily.   Dulaglutide 0.75 MG/0.5ML Sopn Commonly known as:  TRULICITY Inject 8.50 mg into  the skin once a week.   esomeprazole 40 MG capsule Commonly known as:  NEXIUM Take 40 mg by mouth daily at 12 noon.   fexofenadine 180 MG tablet Commonly known as:  ALLEGRA Take 180 mg by mouth daily.   FLONASE 50 MCG/ACT nasal spray Generic drug:  fluticasone Place 2 sprays into both nostrils as needed for allergies or rhinitis.   hydrochlorothiazide 12.5 MG capsule Commonly known as:  MICROZIDE Take 1 capsule (12.5 mg total) by mouth daily.   irbesartan 300 MG tablet Commonly known as:  AVAPRO Take 300 mg by mouth daily.   metFORMIN 1000 MG tablet Commonly known as:  GLUCOPHAGE Take 1,000 mg by mouth 2 (two) times daily with a meal.   nitroGLYCERIN 0.4 MG SL tablet Commonly known as:  NITROSTAT Place 1 tablet (0.4 mg total) under the tongue every 5 (five) minutes as needed for chest pain.   rosuvastatin 5 MG tablet Commonly known as:  CRESTOR TAKE 1 TABLET EVERY OTHER DAY ORALLY   VITAMIN D PO Take by mouth. 1000 units once daily       Allergies:  Allergies  Allergen Reactions  . Augmentin [Amoxicillin-Pot Clavulanate]     Gi sx    Past Medical History:  Diagnosis Date  . Allergic rhinitis   . Allergy   . CAD (coronary artery disease) 4/12   WITH LAD DES   . Diabetes ([place]) 2007  . Elevated cholesterol    pt denies this 11-11-15- states his cholesterol is fine   . GERD (gastroesophageal reflux disease)   . Hyperlipidemia   . Hypertension   . MI (mitral incompetence)   . Vitamin D deficiency     Past Surgical History:  Procedure Laterality Date  . COLONOSCOPY  03-27-2004  . CORONARY ANGIOPLASTY WITH STENT PLACEMENT  11-2010  . VASECTOMY      Family History  Problem Relation Age of Onset  . Hepatitis Brother   . Diabetes Paternal Grandfather   . Heart failure Paternal Grandfather        CHF  . Colon cancer Neg Hx   . Colon polyps Neg Hx     Social History:  reports that he has never smoked. He has never used smokeless tobacco. He reports  that he does  not drink alcohol or use drugs.   Review of Systems   Lipid history:  Baseline cholesterol has been normal, started on Crestor for history of CAD, currently only on 5 mg Every other day as prescribed by PCP Followed by PCP  His last LDL was 24   No results found for: CHOL, HDL, LDLCALC, LDLDIRECT, TRIG, CHOLHDL         Hypertension: Treated with Avapro 300 mg, has had long-standing hypertension and is followed by PCP, blood pressure was 108/80 earlier this month  Most recent eye exam was 5/19, Herbert Deaner  Most recent foot exam: 2/18    LABS:  Lab on 01/20/2018  Component Date Value Ref Range Status  . Microalb, Ur 01/20/2018 <0.7  0.0 - 1.9 mg/dL Final  . Creatinine,U 01/20/2018 118.9  mg/dL Final  . Microalb Creat Ratio 01/20/2018 0.6  0.0 - 30.0 mg/g Final  . Sodium 01/20/2018 139  135 - 145 mEq/L Final  . Potassium 01/20/2018 4.0  3.5 - 5.1 mEq/L Final  . Chloride 01/20/2018 105  96 - 112 mEq/L Final  . CO2 01/20/2018 24  19 - 32 mEq/L Final  . Glucose, Bld 01/20/2018 155* 70 - 99 mg/dL Final  . BUN 01/20/2018 16  6 - 23 mg/dL Final  . Creatinine, Ser 01/20/2018 1.26  0.40 - 1.50 mg/dL Final  . Total Bilirubin 01/20/2018 0.7  0.2 - 1.2 mg/dL Final  . Alkaline Phosphatase 01/20/2018 55  39 - 117 U/L Final  . AST 01/20/2018 19  0 - 37 U/L Final  . ALT 01/20/2018 32  0 - 53 U/L Final  . Total Protein 01/20/2018 6.7  6.0 - 8.3 g/dL Final  . Albumin 01/20/2018 4.2  3.5 - 5.2 g/dL Final  . Calcium 01/20/2018 9.4  8.4 - 10.5 mg/dL Final  . GFR 01/20/2018 61.63  >60.00 mL/min Final  . Hgb A1c MFr Bld 01/20/2018 7.8* 4.6 - 6.5 % Final   Glycemic Control Guidelines for People with Diabetes:Non Diabetic:  <6%Goal of Therapy: <7%Additional Action Suggested:  >8%     Physical Examination:  BP 128/78 (BP Location: Left Arm, Patient Position: Sitting, Cuff Size: Normal)   Pulse 82   Ht 6\' 5"  (1.956 m)   Wt 269 lb 3.2 oz (122.1 kg)   SpO2 98%   BMI 31.92 kg/m    Diabetic Foot Exam - Simple   Simple Foot Form Diabetic Foot exam was performed with the following findings:  Yes   Visual Inspection No deformities, no ulcerations, no other skin breakdown bilaterally:  Yes Sensation Testing Intact to touch and monofilament testing bilaterally:  Yes See comments:  Yes Pulse Check Posterior Tibialis and Dorsalis pulse intact bilaterally:  Yes Comments Relatively decreased monofilament sensation on the distal plantar surface of the feet     ASSESSMENT:  Diabetes type 2, on GLP-1 and Metformin  See history of present illness for detailed discussion of current diabetes management, blood sugar patterns and problems identified  His A1c is surprisingly higher at 7.8, was 6.8  Not clear why his A1c is up because his blood sugars averaging only about 134 recently including after meals at night He did have high sugars for at least a couple of weeks last month when he had steroids and also was not exercising pain in his chest area from trauma He is fairly consistent with checking his sugars up to twice a day and may not be doing enough readings after breakfast and lunch Currently doing fairly well  with Trulicity and seems to tolerate this better than Ozempic   PLAN:    Since he is not able to tolerate 1.5 mg Trulicity he will not increase the dose as yet Will not change his metformin also He needs to be consistent with his exercise regimen and hopefully with further weight loss his A1c was come back down also Follow-up in 3 months    Patient Instructions  Check blood sugars on waking up  2-3/7  Also check blood sugars about 2 hours after a meal and do this after different meals by rotation  Recommended blood sugar levels on waking up is 90-130 and about 2 hours after meal is 130-160  Please bring your blood sugar monitor to each visit, thank you      Elayne Snare 01/25/2018, 9:07 AM   Note: This office note was prepared with Dragon voice  recognition system technology. Any transcriptional errors that result from this process are unintentional.

## 2018-01-25 ENCOUNTER — Ambulatory Visit: Payer: Commercial Managed Care - PPO | Admitting: Endocrinology

## 2018-01-25 ENCOUNTER — Encounter: Payer: Self-pay | Admitting: Endocrinology

## 2018-01-25 VITALS — BP 128/78 | HR 82 | Ht 77.0 in | Wt 269.2 lb

## 2018-01-25 DIAGNOSIS — E1165 Type 2 diabetes mellitus with hyperglycemia: Secondary | ICD-10-CM | POA: Diagnosis not present

## 2018-01-25 NOTE — Patient Instructions (Signed)
Check blood sugars on waking up  2-3/7  Also check blood sugars about 2 hours after a meal and do this after different meals by rotation  Recommended blood sugar levels on waking up is 90-130 and about 2 hours after meal is 130-160  Please bring your blood sugar monitor to each visit, thank you   

## 2018-02-08 ENCOUNTER — Other Ambulatory Visit: Payer: Self-pay | Admitting: Endocrinology

## 2018-02-25 DIAGNOSIS — M25569 Pain in unspecified knee: Secondary | ICD-10-CM | POA: Diagnosis not present

## 2018-03-05 DIAGNOSIS — M25561 Pain in right knee: Secondary | ICD-10-CM | POA: Diagnosis not present

## 2018-03-05 DIAGNOSIS — M25562 Pain in left knee: Secondary | ICD-10-CM | POA: Diagnosis not present

## 2018-04-06 DIAGNOSIS — E1122 Type 2 diabetes mellitus with diabetic chronic kidney disease: Secondary | ICD-10-CM | POA: Diagnosis not present

## 2018-04-06 LAB — BASIC METABOLIC PANEL
BUN: 15 (ref 4–21)
CREATININE: 1.2 (ref 0.6–1.3)
GLUCOSE: 129
POTASSIUM: 4.8 (ref 3.4–5.3)
SODIUM: 141 (ref 137–147)

## 2018-04-06 LAB — LIPID PANEL
Cholesterol: 77 (ref 0–200)
HDL: 37 (ref 35–70)
LDL Cholesterol: 16
LDL/HDL RATIO: 0.4
Triglycerides: 120 (ref 40–160)

## 2018-04-06 LAB — CBC AND DIFFERENTIAL
HCT: 43 (ref 41–53)
Hemoglobin: 14.5 (ref 13.5–17.5)
NEUTROS ABS: 4
Platelets: 201 (ref 150–399)
WBC: 5.6

## 2018-04-06 LAB — HEPATIC FUNCTION PANEL
ALK PHOS: 58 (ref 25–125)
ALT: 29 (ref 10–40)
AST: 20 (ref 14–40)
Bilirubin, Total: 0.8

## 2018-04-06 LAB — HEMOGLOBIN A1C: Hgb A1c MFr Bld: 6.5 — AB (ref 4.0–6.0)

## 2018-04-13 DIAGNOSIS — Z6831 Body mass index (BMI) 31.0-31.9, adult: Secondary | ICD-10-CM | POA: Diagnosis not present

## 2018-04-13 DIAGNOSIS — E785 Hyperlipidemia, unspecified: Secondary | ICD-10-CM | POA: Diagnosis not present

## 2018-04-13 DIAGNOSIS — E1122 Type 2 diabetes mellitus with diabetic chronic kidney disease: Secondary | ICD-10-CM | POA: Diagnosis not present

## 2018-04-17 NOTE — Progress Notes (Signed)
CARDIOLOGY OFFICE NOTE  Date:  04/18/2018    Henry Rivera Date of Birth: 02/04/1956 Medical Record #[id number]  PCP:  Deland Pretty, MD  Cardiologist:  Jennings Books    Chief Complaint  Patient presents with  . Coronary Artery Disease    1 year check. Seen for Dr. Tamala Julian    History of Present Illness: Henry Rivera is a 62 y.o. male who presents today for a one year check. Seen for Dr. Tamala Julian.   He has a history of CAD with prior LAD DES in 2012, DM, HLD and HTN.   Last seen a year ago - doing well. BP elevated with exercise. HCTZ was started.   Comes in today. Here alone. Doing ok. Has had physical last week with labs done. Has had 2 spells since starting HCTZ while playing golf and bending over that he felt like he would pass out - he did not. He tries to stay hydrated. He has had some orthopedic issues which has hindered his exercise but he has been trying to continue to lose weight. No chest pain. Breathing is fine. Overall, he feels like he is doing well.   Past Medical History:  Diagnosis Date  . Allergic rhinitis   . Allergy   . CAD (coronary artery disease) 4/12   WITH LAD DES   . Diabetes ([place]) 2007  . Elevated cholesterol    pt denies this 11-11-15- states his cholesterol is fine   . GERD (gastroesophageal reflux disease)   . Hyperlipidemia   . Hypertension   . MI (mitral incompetence)   . Vitamin D deficiency     Past Surgical History:  Procedure Laterality Date  . COLONOSCOPY  03-27-2004  . CORONARY ANGIOPLASTY WITH STENT PLACEMENT  11-2010  . VASECTOMY       Medications: Current Meds  Medication Sig  . aspirin 81 MG tablet Take 81 mg by mouth daily.  . Cholecalciferol (VITAMIN D PO) Take by mouth. 1000 units once daily  . CINNAMON PO Take 1,000 capsules by mouth daily.  . Dulaglutide (TRULICITY) 7.02 OV/7.8HY SOPN Inject 0.75 mg into the skin once a week.  . esomeprazole (NEXIUM) 40 MG capsule Take 40 mg by mouth daily at 12 noon.    . fexofenadine (ALLEGRA) 180 MG tablet Take 180 mg by mouth daily.  . fluticasone (FLONASE) 50 MCG/ACT nasal spray Place 2 sprays into both nostrils as needed for allergies or rhinitis.  . hydrochlorothiazide (MICROZIDE) 12.5 MG capsule Take 1 capsule (12.5 mg total) by mouth daily.  . metFORMIN (GLUCOPHAGE) 1000 MG tablet Take 1,000 mg by mouth 2 (two) times daily with a meal.  . nitroGLYCERIN (NITROSTAT) 0.4 MG SL tablet Place 1 tablet (0.4 mg total) under the tongue every 5 (five) minutes as needed for chest pain.  . ONE TOUCH ULTRA TEST test strip TEST BLOOD GLUCOSE TWICE DAILY  . Potassium (POTASSIMIN PO) Take 595 tablets by mouth daily.  . rosuvastatin (CRESTOR) 5 MG tablet TAKE 1 TABLET EVERY OTHER DAY ORALLY  . valsartan (DIOVAN) 320 MG tablet Take 320 mg by mouth daily.     Allergies: Allergies  Allergen Reactions  . Augmentin [Amoxicillin-Pot Clavulanate]     Gi sx    Social History: The patient  reports that he has never smoked. He has never used smokeless tobacco. He reports that he does not drink alcohol or use drugs.   Family History: The patient's family history includes Diabetes in his paternal grandfather;  Heart failure in his paternal grandfather; Hepatitis in his brother.   Review of Systems: Please see the history of present illness.   Otherwise, the review of systems is positive for none.   All other systems are reviewed and negative.   Physical Exam: VS:  BP 118/90   Pulse 82   Ht 6\' 5"  (1.956 m)   Wt 269 lb 1.9 oz (122.1 kg)   BMI 31.91 kg/m  .  BMI Body mass index is 31.91 kg/m.  Wt Readings from Last 3 Encounters:  04/18/18 269 lb 1.9 oz (122.1 kg)  01/25/18 269 lb 3.2 oz (122.1 kg)  10/20/17 276 lb (125.2 kg)    General: Pleasant. Well developed, well nourished and in no acute distress.  He is of large stature.  HEENT: Normal.  Neck: Supple, no JVD, carotid bruits, or masses noted.  Cardiac: Regular rate and rhythm. No murmurs, rubs, or  gallops. No edema.  Respiratory:  Lungs are clear to auscultation bilaterally with normal work of breathing.  GI: Soft and nontender.  MS: No deformity or atrophy. Gait and ROM intact.  Skin: Warm and dry. Color is normal.  Neuro:  Strength and sensation are intact and no gross focal deficits noted.  Psych: Alert, appropriate and with normal affect.   LABORATORY DATA:  EKG:  EKG is ordered today. This demonstrates NSR with nonspecific T wave changes.  Lab Results  Component Value Date   WBC 7.2 07/07/2016   HGB 15.6 07/07/2016   HCT 46.0 07/07/2016   PLT 173 07/07/2016   GLUCOSE 155 (H) 01/20/2018   ALT 32 01/20/2018   AST 19 01/20/2018   NA 139 01/20/2018   K 4.0 01/20/2018   CL 105 01/20/2018   CREATININE 1.26 01/20/2018   BUN 16 01/20/2018   CO2 24 01/20/2018   HGBA1C 7.8 (H) 01/20/2018   MICROALBUR <0.7 01/20/2018       BNP (last 3 results) No results for input(s): BNP in the last 8760 hours.  ProBNP (last 3 results) No results for input(s): PROBNP in the last 8760 hours.   Other Studies Reviewed Today:  GXT Study Highlights 09/2017    Blood pressure demonstrated a hypertensive response to exercise.  There was no ST segment deviation noted during stress.   No ischemia. Hypertensive response to exercise. Normal exercise capacity.     CARDIAC CATH CONCLUSIONS 2012: 1. Coronary atherosclerosis with high-grade proximal LAD stenosis     producing an 85% lesion reduced to 0% with TIMI grade 3 flow. 2. Widely patent circumflex and right coronary. 3. Normal LV function.  PLAN:  Aspirin and Effient indefinitely given location of the stent. Hopeful discharge in the a.m.   Belva Crome, M.D.  Assessment/Plan:  1. CAD - remote LAD stent - doing well overall - continue with CV risk factor modification.   2. HTN - BP ok on current regimen - some dizziness/presyncope on golf course/excessive heat - would hold HCTZ on those days - take afterwards -  stay hydrated. Continue with current regimen otherwise.   3. HLD - on statin - labs recently with PCP  4. DM - followed by endocrine.    Current medicines are reviewed with the patient today.  The patient does not have concerns regarding medicines other than what has been noted above.  The following changes have been made:  See above.  Labs/ tests ordered today include:    Orders Placed This Encounter  Procedures  . EKG 12-Lead  Disposition:   FU with Dr. Tamala Julian in one year.   Patient is agreeable to this plan and will call if any problems develop in the interim.   SignedTruitt Merle, NP  04/18/2018 8:36 AM  [facility] [address] [place], [place]  [address] Phone: [phone] Fax: [phone]

## 2018-04-18 ENCOUNTER — Encounter: Payer: Self-pay | Admitting: Nurse Practitioner

## 2018-04-18 ENCOUNTER — Ambulatory Visit: Payer: Commercial Managed Care - PPO | Admitting: Nurse Practitioner

## 2018-04-18 VITALS — BP 118/90 | HR 82 | Ht 77.0 in | Wt 269.1 lb

## 2018-04-18 DIAGNOSIS — I10 Essential (primary) hypertension: Secondary | ICD-10-CM | POA: Diagnosis not present

## 2018-04-18 DIAGNOSIS — E7849 Other hyperlipidemia: Secondary | ICD-10-CM | POA: Diagnosis not present

## 2018-04-18 DIAGNOSIS — I2510 Atherosclerotic heart disease of native coronary artery without angina pectoris: Secondary | ICD-10-CM | POA: Diagnosis not present

## 2018-04-18 NOTE — Patient Instructions (Addendum)
We will be checking the following labs today - NONE   Medication Instructions:    Continue with your current medicines. BUT  Hold the HCTZ on days you play golf and it is hot outside - ok to take afterwards.      Testing/Procedures To Be Arranged:  N/A  Follow-Up:   See Dr. Tamala Julian in one year    Other Special Instructions:   Stay hydrated  Keep up the good work with losing weight    If you need a refill on your cardiac medications before your next appointment, please call your pharmacy.   Call the [place] office at [phone] if you have any questions, problems or concerns.

## 2018-04-25 ENCOUNTER — Other Ambulatory Visit: Payer: Commercial Managed Care - PPO

## 2018-04-27 ENCOUNTER — Encounter: Payer: Self-pay | Admitting: Endocrinology

## 2018-04-27 ENCOUNTER — Ambulatory Visit (INDEPENDENT_AMBULATORY_CARE_PROVIDER_SITE_OTHER): Payer: Commercial Managed Care - PPO | Admitting: Endocrinology

## 2018-04-27 VITALS — BP 138/80 | HR 87 | Ht 77.0 in | Wt 266.0 lb

## 2018-04-27 DIAGNOSIS — E1169 Type 2 diabetes mellitus with other specified complication: Secondary | ICD-10-CM

## 2018-04-27 DIAGNOSIS — E669 Obesity, unspecified: Secondary | ICD-10-CM | POA: Diagnosis not present

## 2018-04-27 MED ORDER — ONETOUCH VERIO VI STRP: ORAL_STRIP | 3 refills | Status: DC

## 2018-04-27 NOTE — Progress Notes (Signed)
Patient ID: Henry Rivera, male   DOB: [birth year], 62 y.o.   MRN: [id number]            Reason for Appointment: Follow-up for Type 2 Diabetes    History of Present Illness:          Date of diagnosis of type 2 diabetes mellitus: 2007      Background history:   His A1c was apparently 12.7 at the time of diagnosis when he had increased thirst and urination He was started on Janumet and apparently continued unchanged until last year, this was stopped because of high out-of-pocket expense He was subsequently on metformin only and then Jardiance 10 mg started His A1c in 2017 was 6.7 twice, previous records not available.  Recent history:       Non-insulin hypoglycemic drugs the patient is taking are: Trulicity 7.84 mg weekly,metformin 1 g twice a day   His A1c is now 6.5 compared to 7.8 in 6/19   Current management, blood sugar patterns and problems identified:   His glucose was previously high partly because of getting steroids and blood sugars as high as 316  However he seems to have done well with his diet more recently  Although occasionally he is not having ability to exercise but now is back up to walking 5 to 6 miles a day  He is checking his blood sugar mostly in the morning and occasionally 2 hours after eating and blood sugars were reviewed from his smart phone app but he did not have his meter to download today  Highest blood sugar recently is only about 160 yesterday morning and also 190 after meals 2 weeks ago  He thinks his weight is coming down and he wants to come down to 250 pounds  Currently no side effects with Trulicity       Side effects from medications have been: None  Compliance with the medical regimen: Usually good Hypoglycemia: none   Glucose monitoring:  done 2 times a day         Glucometer: One Touch ultra mini .      Blood Glucose readings by download as follows  FASTING range recently 117-160 Nonfasting range 79-146 with most  readings done in the morning  Self-care: The diet that the patient has been following is: tries to limit high-fat foods .                  Dietician visit, most recent: none, previously attended classes at cardiac rehabilitation               Exercise:  Walking now, 3-6 miles almost daily     Weight history: Maximum weight had been 314  Wt Readings from Last 3 Encounters:  04/27/18 266 lb (120.7 kg)  04/18/18 269 lb 1.9 oz (122.1 kg)  01/25/18 269 lb 3.2 oz (122.1 kg)    Glycemic control:    Lab Results  Component Value Date   HGBA1C 7.8 (H) 01/20/2018   HGBA1C 6.8 (H) 07/04/2017   HGBA1C 6.4 03/01/2017   Lab Results  Component Value Date   MICROALBUR <0.7 01/20/2018   CREATININE 1.26 01/20/2018   Lab Results  Component Value Date   MICRALBCREAT 0.6 01/20/2018    Last microalbumin/creatinine ratio normal in 09/2015  Other active problems: See review of systems   Allergies as of 04/27/2018      Reactions   Augmentin [amoxicillin-pot Clavulanate]    Gi sx  Medication List        Accurate as of 04/27/18 12:55 PM. Always use your most recent med list.          aspirin 81 MG tablet Take 81 mg by mouth daily.   CINNAMON PO Take 1,000 capsules by mouth daily.   Dulaglutide 0.75 MG/0.5ML Sopn Inject 0.75 mg into the skin once a week.   esomeprazole 40 MG capsule Commonly known as:  NEXIUM Take 40 mg by mouth daily at 12 noon.   fexofenadine 180 MG tablet Commonly known as:  ALLEGRA Take 180 mg by mouth daily.   FLONASE 50 MCG/ACT nasal spray Generic drug:  fluticasone Place 2 sprays into both nostrils as needed for allergies or rhinitis.   hydrochlorothiazide 12.5 MG capsule Commonly known as:  MICROZIDE Take 1 capsule (12.5 mg total) by mouth daily.   metFORMIN 1000 MG tablet Commonly known as:  GLUCOPHAGE Take 1,000 mg by mouth 2 (two) times daily with a meal.   nitroGLYCERIN 0.4 MG SL tablet Commonly known as:  NITROSTAT Place 1 tablet  (0.4 mg total) under the tongue every 5 (five) minutes as needed for chest pain.   ONETOUCH VERIO test strip Generic drug:  glucose blood Check sugar 2 times a day.   POTASSIMIN PO Take 595 tablets by mouth daily.   rosuvastatin 5 MG tablet Commonly known as:  CRESTOR TAKE 1 TABLET EVERY OTHER DAY ORALLY   valsartan 320 MG tablet Commonly known as:  DIOVAN Take 320 mg by mouth daily.   VITAMIN D PO Take by mouth. 1000 units once daily       Allergies:  Allergies  Allergen Reactions  . Augmentin [Amoxicillin-Pot Clavulanate]     Gi sx    Past Medical History:  Diagnosis Date  . Allergic rhinitis   . Allergy   . CAD (coronary artery disease) 4/12   WITH LAD DES   . Diabetes ([place]) 2007  . Elevated cholesterol    pt denies this 11-11-15- states his cholesterol is fine   . GERD (gastroesophageal reflux disease)   . Hyperlipidemia   . Hypertension   . MI (mitral incompetence)   . Vitamin D deficiency     Past Surgical History:  Procedure Laterality Date  . COLONOSCOPY  03-27-2004  . CORONARY ANGIOPLASTY WITH STENT PLACEMENT  11-2010  . VASECTOMY      Family History  Problem Relation Age of Onset  . Hepatitis Brother   . Diabetes Paternal Grandfather   . Heart failure Paternal Grandfather        CHF  . Colon cancer Neg Hx   . Colon polyps Neg Hx     Social History:  reports that he has never smoked. He has never used smokeless tobacco. He reports that he does not drink alcohol or use drugs.   Review of Systems   Lipid history:  Baseline cholesterol has been normal, started on Crestor for history of CAD, currently only on 5 mg Every other day as prescribed by PCP Followed by PCP  His last LDL was 16 with HDL 37   No results found for: CHOL, HDL, LDLCALC, LDLDIRECT, TRIG, CHOLHDL         Hypertension: Treated with Diovan 320 mg, has had long-standing hypertension and is followed by PCP  Most recent eye exam was 5/19, sees Dr. Herbert Deaner  Most recent  foot exam: 12/2017    LABS:  No visits with results within 1 Week(s) from this visit.  Latest  known visit with results is:  Abstract on 02/03/2018  Component Date Value Ref Range Status  . HM Diabetic Eye Exam 12/30/2017 No Retinopathy  No Retinopathy Final    Physical Examination:  BP 138/80   Pulse 87   Ht 6\' 5"  (1.956 m)   Wt 266 lb (120.7 kg)   SpO2 96%   BMI 31.54 kg/m    ASSESSMENT:  Diabetes type 2, on GLP-1 and Metformin  See history of present illness for detailed discussion of current diabetes management, blood sugar patterns and problems identified  His A1c is much better now at 6.5, previously 7.8.  This was done at [facility]  He is tolerating Trulicity well and back to starting to walk Overall trying to watch his diet He is more motivated to lose weight Blood sugars are excellent at home with mildly high fasting readings but usually no significant hyperglycemia after his evening meal  PLAN:    No change in medication regimen He was given the One Touch Verio flex meter since he likes to keep his blood sugar record on his smart phone Discussed blood sugar targets    Patient Instructions  Check blood sugars on waking up  2-3/7  Also check blood sugars about 2 hours after a meal and do this after different meals by rotation  Recommended blood sugar levels on waking up is 90-130 and about 2 hours after meal is 130-160  Please bring your blood sugar monitor to each visit, thank you       Elayne Snare 04/27/2018, 12:55 PM   Note: This office note was prepared with Dragon voice recognition system technology. Any transcriptional errors that result from this process are unintentional.

## 2018-04-27 NOTE — Patient Instructions (Signed)
Check blood sugars on waking up  2-3/7  Also check blood sugars about 2 hours after a meal and do this after different meals by rotation  Recommended blood sugar levels on waking up is 90-130 and about 2 hours after meal is 130-160  Please bring your blood sugar monitor to each visit, thank you   

## 2018-04-28 ENCOUNTER — Encounter: Payer: Self-pay | Admitting: Endocrinology

## 2018-04-28 NOTE — Progress Notes (Signed)
[facility]/thx dmf

## 2018-05-06 DIAGNOSIS — B029 Zoster without complications: Secondary | ICD-10-CM | POA: Diagnosis not present

## 2018-05-06 DIAGNOSIS — R21 Rash and other nonspecific skin eruption: Secondary | ICD-10-CM | POA: Diagnosis not present

## 2018-05-27 ENCOUNTER — Other Ambulatory Visit: Payer: Self-pay | Admitting: Endocrinology

## 2018-05-30 DIAGNOSIS — L570 Actinic keratosis: Secondary | ICD-10-CM | POA: Diagnosis not present

## 2018-05-30 DIAGNOSIS — D229 Melanocytic nevi, unspecified: Secondary | ICD-10-CM | POA: Diagnosis not present

## 2018-06-11 IMAGING — DX DG CHEST 2V
2 series · 2 of 2 positions shown · non-contrast
Comparison: 11/03/2010

CLINICAL DATA: Abnormal EKG this am at PCP; cough/congestion Xfew
days; hx HTN, CAD, diabetic, CABG X5 years ago; non-smoker

EXAM:
CHEST  2 VIEW

[w chest pa]
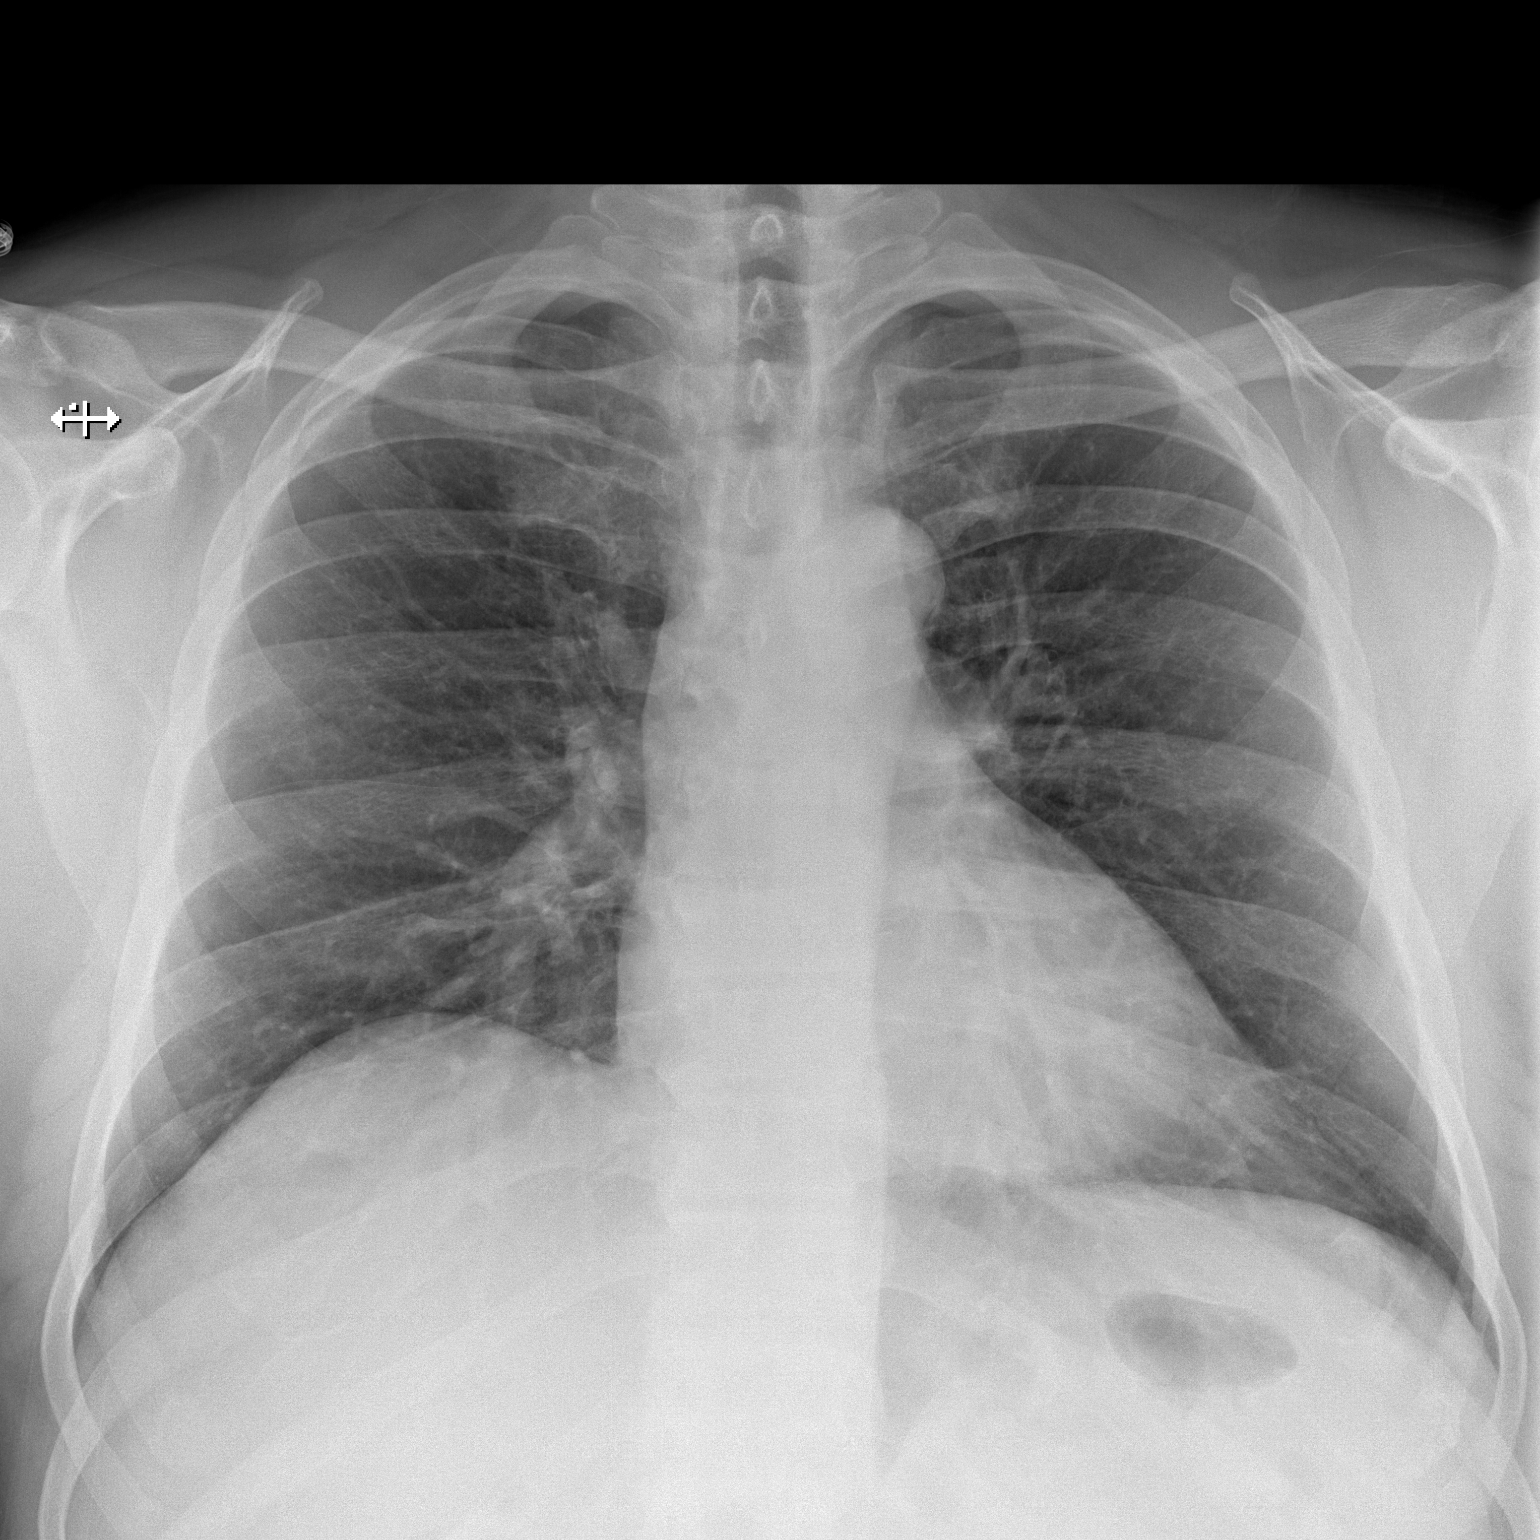

[w chest lat]
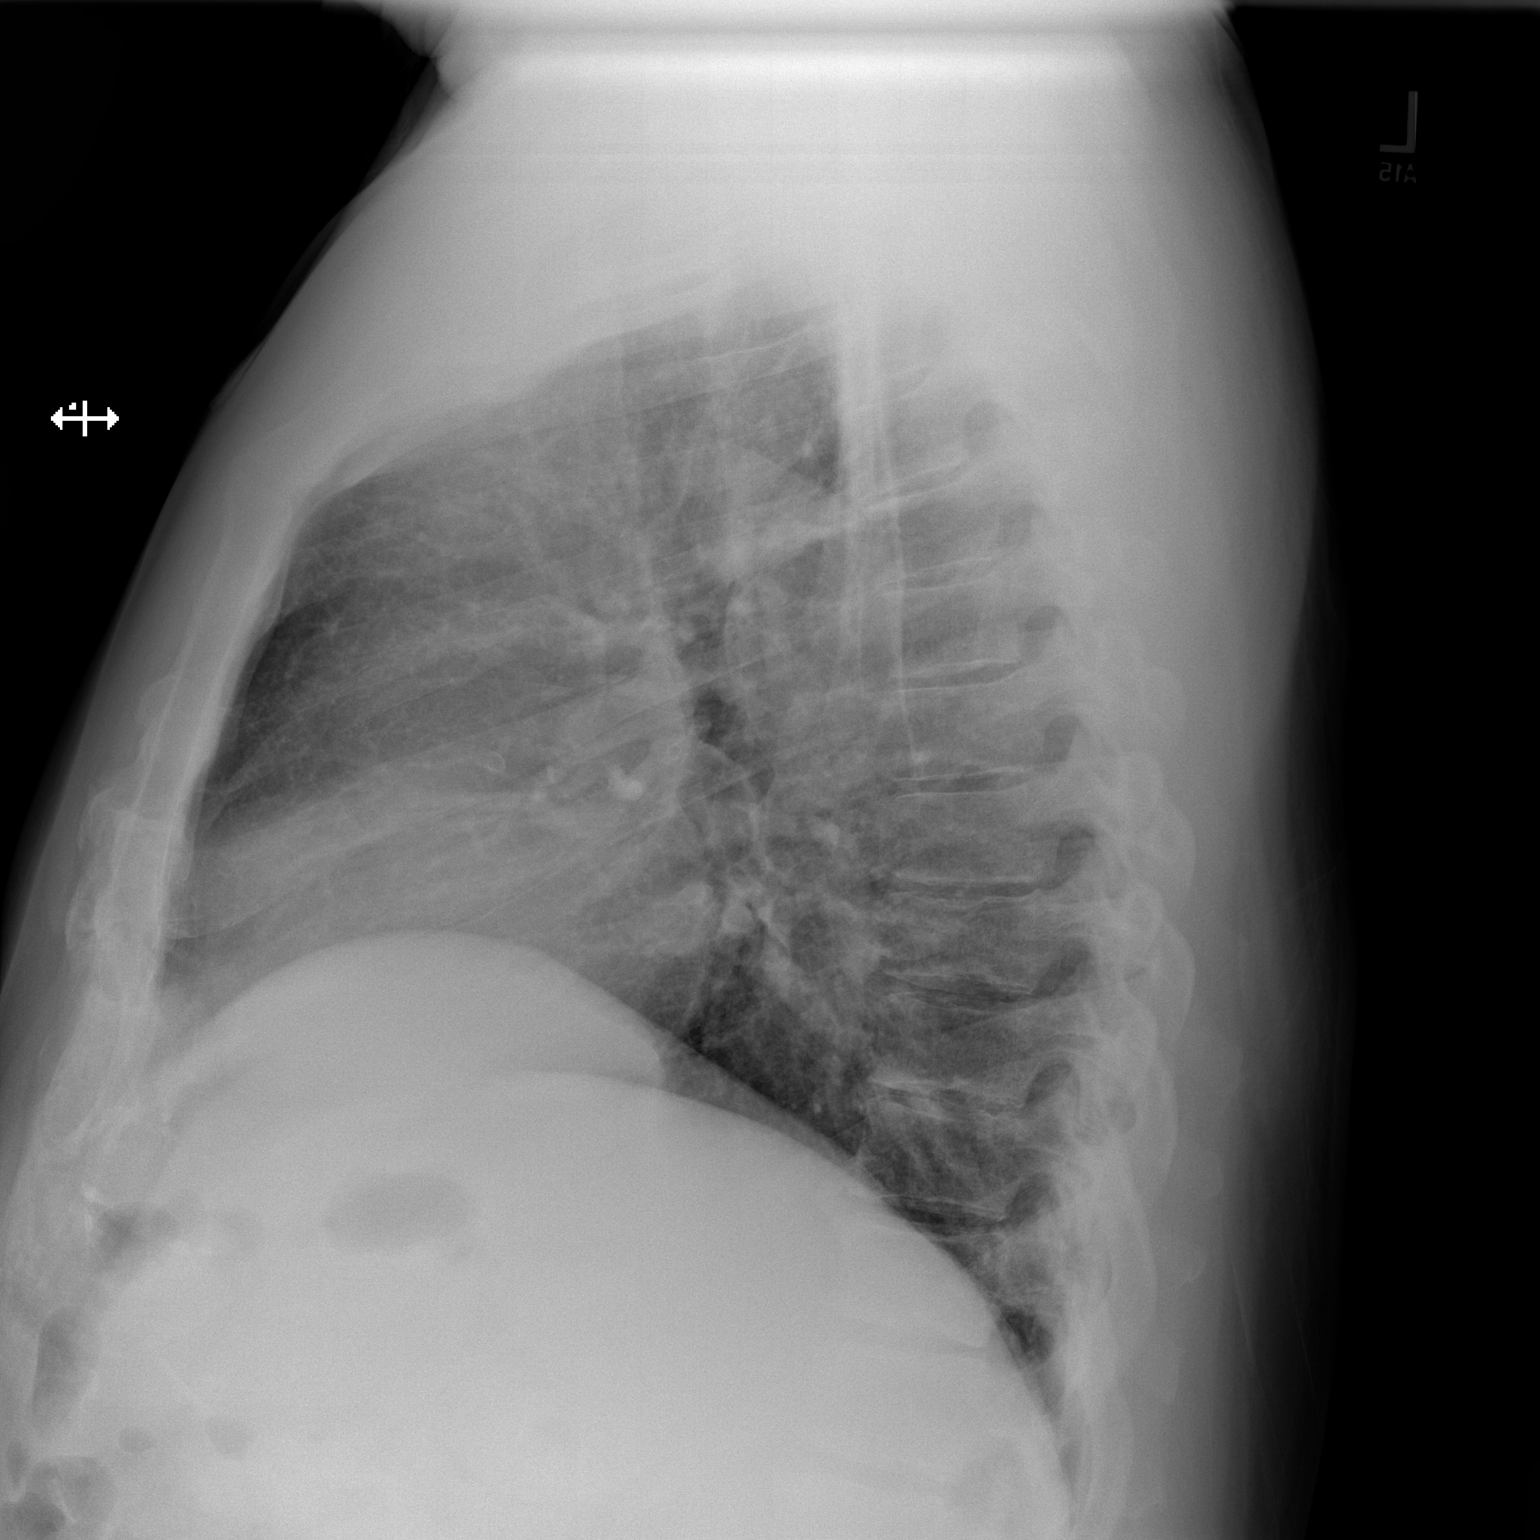

[2 of 2 positions shown; findings below may reference images not displayed]

FINDINGS: The heart size and mediastinal contours are within normal limits.
Both lungs are clear. No pleural effusion or pneumothorax. The
visualized skeletal structures are unremarkable.
IMPRESSION: No active cardiopulmonary disease.

## 2018-08-23 ENCOUNTER — Ambulatory Visit (INDEPENDENT_AMBULATORY_CARE_PROVIDER_SITE_OTHER): Payer: PRIVATE HEALTH INSURANCE | Admitting: Family Medicine

## 2018-08-23 ENCOUNTER — Encounter (INDEPENDENT_AMBULATORY_CARE_PROVIDER_SITE_OTHER): Payer: Self-pay | Admitting: Family Medicine

## 2018-08-23 VITALS — BP 132/80 | HR 79 | Temp 96.9°F | Resp 16 | Ht 77.0 in | Wt 271.0 lb

## 2018-08-23 DIAGNOSIS — J329 Chronic sinusitis, unspecified: Secondary | ICD-10-CM

## 2018-08-23 DIAGNOSIS — J40 Bronchitis, not specified as acute or chronic: Principal | ICD-10-CM

## 2018-08-23 MED ORDER — PROMETHAZINE 25 MG TABLET
25.0000 mg | ORAL_TABLET | Freq: Four times a day (QID) | ORAL | 0 refills | Status: AC | PRN
Start: 2018-08-23 — End: 2018-08-30

## 2018-08-23 MED ORDER — CEFDINIR 300 MG CAPSULE: 300 mg | Cap | Freq: Two times a day (BID) | ORAL | 0 refills | 0 days | Status: AC

## 2018-08-23 NOTE — Progress Notes (Signed)
[place]  [place] [id number]    Progress Note    Name: Ercell Razon MRN:  [id number]   Date: 08/23/2018 Age: 63 y.o.         Reason for Visit: Cough (productive with green/yellow mucous, bloody mucous); Sinus Problem (runny/stuffy nose, started Monday); and Sore Throat    Nursing Notes:  There are no exam notes on file for this visit.    History of Present Illness  Rachard Isidro is a 63 y.o. male who is being seen today for  2 day history of cough and sinus congestion.  Patient states the sputum is yellow colored.  Symptoms are moderate.  Patient has taken Delsyn  For cough.    Past Medical History:   Diagnosis Date   . Diabetes mellitus, type 2 (CMS HCC)    . Esophageal reflux    . H/O heart artery stent    . Hypertension    . Skin cancer of arm     elbow         Past Surgical History:   Procedure Laterality Date   . CORONARY STENT PLACEMENT     . HX VASECTOMY           Current Outpatient Medications   Medication Sig   . cholecalciferol, vitamin D3, 25 mcg (1,000 unit) Oral Tablet Take 1,000 Units by mouth Once a day   . Cinnamon Bark (CINNAMON) 500 mg Oral Capsule Take by mouth   . esomeprazole magnesium (NEXIUM) 40 mg Oral Capsule, Delayed Release(E.C.) TAKE 1 CAPSULE BY MOUTH EVERY DAY   . fexofenadine (ALLEGRA) 180 mg Oral Tablet Take 180 mg by mouth Once a day   . fluticasone propionate (FLONASE) 50 mcg/actuation Nasal Spray, Suspension 1 Spray by Each Nostril route Once a day   . hydroCHLOROthiazide (MICROZIDE) 12.5 mg Oral Capsule TAKE 1 CAPSULE BY MOUTH EVERY DAY   . MetFORMIN (GLUCOPHAGE) 1,000 mg Oral Tablet TAKE 1 TABLET BY MOUTH TWICE A DAY WITH MEALS   . potassium gluconate 595 mg (99 mg) Oral Tablet Take 595 mg by mouth Every morning with breakfast   . rosuvastatin (CRESTOR) 5 mg Oral Tablet 1 TABLET EVERY OTHER DAY ORALLY 90   . TRULICITY 4.37 DH/7.8 mL Subcutaneous Pen Injector    . Valsartan (DIOVAN) 320 mg Oral Tablet TAKE 1 TABLET BY MOUTH EVERY DAY     Allergies      Allergen Reactions   . Augmentin [Amoxicillin-Pot Clavulanate] Nausea/ Vomiting     Family Medical History:     Problem Relation (Age of Onset)    Congestive Heart Failure Mother    Diabetes Mother    Heart Disease Father    Pulmonary embolism Father            Social History     Tobacco Use   . Smoking status: Never Smoker   . Smokeless tobacco: Never Used   Substance Use Topics   . Alcohol use: Not on file   . Drug use: Not on file       Review of Systems  Review of Systems   Constitutional: Negative.    HENT: Positive for congestion and sinus pain.    Eyes: Negative.    Respiratory: Positive for cough.    Cardiovascular: Negative.    Gastrointestinal: Negative.    Genitourinary: Negative.    Musculoskeletal: Negative.    Skin: Negative.    Neurological: Negative.    Endo/Heme/Allergies: Negative.  Psychiatric/Behavioral: Negative.        Physical Exam:  BP 132/80   Pulse 79   Temp 36.1 C (96.9 F)   Resp 16   Ht 1.956 m (6\' 5" )   Wt 122.9 kg (271 lb)   SpO2 99%   BMI 32.14 kg/m       Physical Exam    Assessment:    Bronchitis    Sinusitis, unspecified chronicity, unspecified location    Results   None     Plan:    No orders of the defined types were placed in this encounter.      Follow up: No follow-ups on file.    Patient  Has not been seen in this clinic in the last three years.      Follow up with PCP.  Seek medical attention for new or worsening symptoms.      Loel Ro, MD 9:19 AM 08/23/2018

## 2018-08-24 ENCOUNTER — Other Ambulatory Visit: Payer: Commercial Managed Care - PPO

## 2018-08-25 ENCOUNTER — Other Ambulatory Visit (INDEPENDENT_AMBULATORY_CARE_PROVIDER_SITE_OTHER): Payer: Commercial Managed Care - PPO

## 2018-08-25 DIAGNOSIS — E1165 Type 2 diabetes mellitus with hyperglycemia: Secondary | ICD-10-CM

## 2018-08-25 LAB — LIPID PANEL
CHOLESTEROL: 75 mg/dL (ref 0–200)
HDL: 36.8 mg/dL — AB (ref >39.00)
LDL Cholesterol: 22 mg/dL (ref 0–99)
NonHDL: 37.81
Total CHOL/HDL Ratio: 2
Triglycerides: 79 mg/dL (ref 0.0–149.0)
VLDL: 15.8 mg/dL (ref 0.0–40.0)

## 2018-08-25 LAB — HEMOGLOBIN A1C: Hgb A1c MFr Bld: 6.5 % (ref 4.6–6.5)

## 2018-08-25 LAB — BASIC METABOLIC PANEL
BUN: 16 mg/dL (ref 6–23)
CALCIUM: 9.6 mg/dL (ref 8.4–10.5)
CO2: 27 meq/L (ref 19–32)
CREATININE: 1.23 mg/dL (ref 0.40–1.50)
Chloride: 104 mEq/L (ref 96–112)
GFR: 59.5 mL/min — ABNORMAL LOW (ref >60.00)
GLUCOSE: 132 mg/dL — AB (ref 70–99)
Potassium: 4.5 mEq/L (ref 3.5–5.1)
SODIUM: 139 meq/L (ref 135–145)

## 2018-08-29 ENCOUNTER — Encounter: Payer: Self-pay | Admitting: Endocrinology

## 2018-08-29 ENCOUNTER — Ambulatory Visit: Payer: Commercial Managed Care - PPO | Admitting: Endocrinology

## 2018-08-29 VITALS — BP 130/82 | HR 87 | Ht 77.0 in | Wt 269.2 lb

## 2018-08-29 DIAGNOSIS — E1169 Type 2 diabetes mellitus with other specified complication: Secondary | ICD-10-CM

## 2018-08-29 DIAGNOSIS — E669 Obesity, unspecified: Secondary | ICD-10-CM

## 2018-08-29 NOTE — Progress Notes (Signed)
Patient ID: Henry Rivera, male   DOB: 1956/02/14, 63 y.o.   MRN: [id number]            Reason for Appointment: Follow-up for Type 2 Diabetes    History of Present Illness:          Date of diagnosis of type 2 diabetes mellitus: 2007      Background history:   His A1c was apparently 12.7 at the time of diagnosis when he had increased thirst and urination He was started on Janumet and apparently continued unchanged until last year, this was stopped because of high out-of-pocket expense He was subsequently on metformin only and then Jardiance 10 mg started His A1c in 2017 was 6.7 twice, previous records not available.  Recent history:       Non-insulin hypoglycemic drugs the patient is taking are: Trulicity 8.91 mg weekly,metformin 1 g twice a day   His A1c is 6.5 again  He is here for the 58-month follow-up   Current management, blood sugar patterns and problems identified:   He has been able to keep his weight down  He is still trying to walk 5 miles a day and continues to do that in wintertime also  Except for celebrations and occasions as well as traveling he is usually consistent with his diet and is aware of what he needs to do  Has been very consistent with checking his blood sugars morning and after meals at night  Usually not checking sugars during the day  Fasting readings are relatively higher than average and 134 in the lab  Again no side effects with Trulicity       Side effects from medications have been: None  Compliance with the medical regimen: Usually good Hypoglycemia: none   Glucose monitoring:  done 2 times a day         Glucometer: One Touch ultra mini .      Blood Glucose readings by download as follows  FASTING range 103-161 with median 121  Postprandial after dinner range 78-181 with MEDIAN 113  Overall MEDIAN 119  Self-care: The diet that the patient has been following is: tries to limit high-fat foods .                    Dietician visit, most recent: none, previously attended classes at cardiac rehabilitation               Exercise:  Walking now, 3-6 miles almost daily     Weight history: Maximum weight had been 314  Wt Readings from Last 3 Encounters:  08/29/18 269 lb 3.2 oz (122.1 kg)  04/27/18 266 lb (120.7 kg)  04/18/18 269 lb 1.9 oz (122.1 kg)    Glycemic control:    Lab Results  Component Value Date   HGBA1C 6.5 08/25/2018   HGBA1C 6.5 (A) 04/06/2018   HGBA1C 7.8 (H) 01/20/2018   Lab Results  Component Value Date   MICROALBUR <0.7 01/20/2018   [place] 22 08/25/2018   CREATININE 1.23 08/25/2018   Lab Results  Component Value Date   MICRALBCREAT 0.6 01/20/2018      Other active problems: See review of systems   Allergies as of 08/29/2018      Reactions   Augmentin [amoxicillin-pot Clavulanate]    Gi sx      Medication List       Accurate as of August 29, 2018  8:29 AM. Always use your most recent med list.  aspirin 81 MG tablet Take 81 mg by mouth daily.   cefdinir 300 MG capsule Commonly known as:  OMNICEF Take 300 mg by mouth 2 (two) times daily. TAKE 1 TABLET BY MOUTH TWICE DAILY.   CINNAMON PO Take 1,000 capsules by mouth daily.   esomeprazole 40 MG capsule Commonly known as:  NEXIUM Take 40 mg by mouth daily at 12 noon.   fexofenadine 180 MG tablet Commonly known as:  ALLEGRA Take 180 mg by mouth daily.   FLONASE 50 MCG/ACT nasal spray Generic drug:  fluticasone Place 2 sprays into both nostrils as needed for allergies or rhinitis.   hydrochlorothiazide 12.5 MG capsule Commonly known as:  MICROZIDE Take 1 capsule (12.5 mg total) by mouth daily.   metFORMIN 1000 MG tablet Commonly known as:  GLUCOPHAGE Take 1,000 mg by mouth 2 (two) times daily with a meal.   nitroGLYCERIN 0.4 MG SL tablet Commonly known as:  NITROSTAT Place 1 tablet (0.4 mg total) under the tongue every 5 (five) minutes as needed for chest pain.   ONETOUCH VERIO test  strip Generic drug:  glucose blood Check sugar 2 times a day.   POTASSIMIN PO Take 595 tablets by mouth daily.   promethazine 25 MG tablet Commonly known as:  PHENERGAN Take 25 mg by mouth every 6 (six) hours as needed for nausea or vomiting. TAKE 1 TABLET BY MOUTH EVERY 6 HOURS AS NEEDED FOR NAUSEA AND VOMITING.   rosuvastatin 5 MG tablet Commonly known as:  CRESTOR TAKE 1 TABLET EVERY OTHER DAY ORALLY   TRULICITY 8.41 YS/0.6TK Sopn Generic drug:  Dulaglutide INJECT 0.75 MG INTO THE SKIN ONCE A WEEK.   valsartan 320 MG tablet Commonly known as:  DIOVAN Take 320 mg by mouth daily.   VITAMIN D PO Take by mouth. 1000 units once daily       Allergies:  Allergies  Allergen Reactions  . Augmentin [Amoxicillin-Pot Clavulanate]     Gi sx    Past Medical History:  Diagnosis Date  . Allergic rhinitis   . Allergy   . CAD (coronary artery disease) 4/12   WITH LAD DES   . Diabetes ([place]) 2007  . Elevated cholesterol    pt denies this 11-11-15- states his cholesterol is fine   . GERD (gastroesophageal reflux disease)   . Hyperlipidemia   . Hypertension   . MI (mitral incompetence)   . Vitamin D deficiency     Past Surgical History:  Procedure Laterality Date  . COLONOSCOPY  03-27-2004  . CORONARY ANGIOPLASTY WITH STENT PLACEMENT  11-2010  . VASECTOMY      Family History  Problem Relation Age of Onset  . Hepatitis Brother   . Diabetes Paternal Grandfather   . Heart failure Paternal Grandfather        CHF  . Colon cancer Neg Hx   . Colon polyps Neg Hx     Social History:  reports that he has never smoked. He has never used smokeless tobacco. He reports that he does not drink alcohol or use drugs.   Review of Systems   Lipid history:  Baseline cholesterol has been normal, started on Crestor for history of CAD, currently only on 5 mg Every other day as prescribed by PCP Followed by PCP  Levels as follows:    Lab Results  Component Value Date   CHOL 75  08/25/2018   HDL 36.80 (L) 08/25/2018   LDLCALC 22 08/25/2018   TRIG 79.0 08/25/2018   CHOLHDL  2 08/25/2018           Hypertension: Treated with Diovan 320 mg, HCTZ is now qod He has had long-standing hypertension and is followed by PCP/cardiology  Most recent eye exam was 5/19, sees Dr. Herbert Deaner  Most recent foot exam: 12/2017   LABS:  Lab on 08/25/2018  Component Date Value Ref Range Status  . Cholesterol 08/25/2018 75  0 - 200 mg/dL Final   ATP III Classification       Desirable:  < 200 mg/dL               Borderline High:  200 - 239 mg/dL          High:  > = 240 mg/dL  . Triglycerides 08/25/2018 79.0  0.0 - 149.0 mg/dL Final   Normal:  <150 mg/dLBorderline High:  150 - 199 mg/dL  . HDL 08/25/2018 36.80* >39.00 mg/dL Final  . VLDL 08/25/2018 15.8  0.0 - 40.0 mg/dL Final  . LDL Cholesterol 08/25/2018 22  0 - 99 mg/dL Final  . Total CHOL/HDL Ratio 08/25/2018 2   Final                  Men          Women1/2 Average Risk     3.4          3.3Average Risk          5.0          4.42X Average Risk          9.6          7.13X Average Risk          15.0          11.0                      . NonHDL 08/25/2018 37.81   Final   NOTE:  Non-HDL goal should be 30 mg/dL higher than patient's LDL goal (i.e. LDL goal of < 70 mg/dL, would have non-HDL goal of < 100 mg/dL)  . Sodium 08/25/2018 139  135 - 145 mEq/L Final  . Potassium 08/25/2018 4.5  3.5 - 5.1 mEq/L Final  . Chloride 08/25/2018 104  96 - 112 mEq/L Final  . CO2 08/25/2018 27  19 - 32 mEq/L Final  . Glucose, Bld 08/25/2018 132* 70 - 99 mg/dL Final  . BUN 08/25/2018 16  6 - 23 mg/dL Final  . Creatinine, Ser 08/25/2018 1.23  0.40 - 1.50 mg/dL Final  . Calcium 08/25/2018 9.6  8.4 - 10.5 mg/dL Final  . GFR 08/25/2018 59.50* >60.00 mL/min Final  . Hgb A1c MFr Bld 08/25/2018 6.5  4.6 - 6.5 % Final   Glycemic Control Guidelines for People with Diabetes:Non Diabetic:  <6%Goal of Therapy: <7%Additional Action Suggested:  >8%     Physical  Examination:  BP 130/82 (BP Location: Left Arm, Patient Position: Sitting, Cuff Size: Large)   Pulse 87   Ht 6\' 5"  (1.956 m)   Wt 269 lb 3.2 oz (122.1 kg)   SpO2 98%   BMI 31.92 kg/m    ASSESSMENT:  Diabetes type 2, on GLP-1 and Metformin  See history of present illness for detailed discussion of current diabetes management, blood sugar patterns and problems identified  His A1c is consistently better now at 6.5  This is with taking Trulicity 5.17 mg weekly along with metformin as before  Usually with consistent exercise and generally watching diet he  is able to maintain his blood sugar control and postprandial readings are also excellent Only one time he had a relatively high reading of 181 after eating pizza Walking 5 miles a day usually when not having any limitations  He refuses to take influenza vaccine  PLAN:    Encouraged him to continue his excellent lifestyle regimen Call if he has any unusually high blood sugar readings We will follow-up in 4 months   There are no Patient Instructions on file for this visit.    Elayne Snare 08/29/2018, 8:29 AM   Note: This office note was prepared with Dragon voice recognition system technology. Any transcriptional errors that result from this process are unintentional.

## 2018-09-01 DIAGNOSIS — J209 Acute bronchitis, unspecified: Secondary | ICD-10-CM | POA: Diagnosis not present

## 2018-09-19 ENCOUNTER — Other Ambulatory Visit: Payer: Self-pay

## 2018-09-19 MED ORDER — DULAGLUTIDE 0.75 MG/0.5ML ~~LOC~~ SOAJ: SUBCUTANEOUS | 3 refills | Status: DC

## 2018-09-25 ENCOUNTER — Other Ambulatory Visit: Payer: Self-pay

## 2018-09-25 ENCOUNTER — Telehealth: Payer: Self-pay | Admitting: Endocrinology

## 2018-09-25 MED ORDER — GLUCOSE BLOOD VI STRP: ORAL_STRIP | 0 refills | Status: DC

## 2018-09-25 NOTE — Telephone Encounter (Signed)
Rx was sent to the pharmacy for these test strips and we have not sent them in before, so it is not clear how they were denied.

## 2018-09-25 NOTE — Telephone Encounter (Signed)
Patient advised that when he came to his visit Dr Dwyane Dee stated he would approve for the patient to receive 1 bottle of the one touch ultra mini test strips.   The patient only uses the meter and strips in case of emergency when he is out somewhere. He went to the pharmacy to pick these up and they advised patient that this prescription was not approved He would like to see if there is someway to get this taken care of so he can receive these strips   Please advise       [id number] - [place], [place] [address]

## 2018-09-27 ENCOUNTER — Telehealth: Payer: Self-pay | Admitting: Endocrinology

## 2018-09-27 NOTE — Telephone Encounter (Signed)
Patient stated he has spoke with the [facility] and they ended up giving him the One Touch Verio instead of the one touch Verio minis  He is stating they needed a prescription again for these. Patient is also going to reach out to the pharmacy to have them contact our office

## 2018-09-28 ENCOUNTER — Other Ambulatory Visit: Payer: Self-pay

## 2018-09-28 MED ORDER — GLUCOSE BLOOD VI STRP: ORAL_STRIP | 0 refills | Status: DC

## 2018-09-28 NOTE — Telephone Encounter (Signed)
Rx sent for OneTouch Ultra Mini Test Strips.

## 2018-10-10 DIAGNOSIS — L720 Epidermal cyst: Secondary | ICD-10-CM | POA: Diagnosis not present

## 2018-10-10 DIAGNOSIS — D229 Melanocytic nevi, unspecified: Secondary | ICD-10-CM | POA: Diagnosis not present

## 2018-10-10 DIAGNOSIS — L570 Actinic keratosis: Secondary | ICD-10-CM | POA: Diagnosis not present

## 2018-10-19 DIAGNOSIS — Z0000 Encounter for general adult medical examination without abnormal findings: Secondary | ICD-10-CM | POA: Diagnosis not present

## 2018-10-19 DIAGNOSIS — E1122 Type 2 diabetes mellitus with diabetic chronic kidney disease: Secondary | ICD-10-CM | POA: Diagnosis not present

## 2018-10-19 DIAGNOSIS — I129 Hypertensive chronic kidney disease with stage 1 through stage 4 chronic kidney disease, or unspecified chronic kidney disease: Secondary | ICD-10-CM | POA: Diagnosis not present

## 2018-10-19 DIAGNOSIS — Z125 Encounter for screening for malignant neoplasm of prostate: Secondary | ICD-10-CM | POA: Diagnosis not present

## 2018-10-19 DIAGNOSIS — Z1159 Encounter for screening for other viral diseases: Secondary | ICD-10-CM | POA: Diagnosis not present

## 2018-10-19 DIAGNOSIS — E785 Hyperlipidemia, unspecified: Secondary | ICD-10-CM | POA: Diagnosis not present

## 2018-10-23 DIAGNOSIS — R7309 Other abnormal glucose: Secondary | ICD-10-CM | POA: Diagnosis not present

## 2018-10-26 DIAGNOSIS — E1122 Type 2 diabetes mellitus with diabetic chronic kidney disease: Secondary | ICD-10-CM | POA: Diagnosis not present

## 2018-10-26 DIAGNOSIS — Z0000 Encounter for general adult medical examination without abnormal findings: Secondary | ICD-10-CM | POA: Diagnosis not present

## 2018-10-26 DIAGNOSIS — E875 Hyperkalemia: Secondary | ICD-10-CM | POA: Diagnosis not present

## 2018-11-08 ENCOUNTER — Other Ambulatory Visit: Payer: Self-pay | Admitting: Endocrinology

## 2018-12-20 ENCOUNTER — Encounter: Payer: Self-pay | Admitting: Gastroenterology

## 2018-12-21 ENCOUNTER — Encounter: Payer: Self-pay | Admitting: Gastroenterology

## 2018-12-26 ENCOUNTER — Other Ambulatory Visit (INDEPENDENT_AMBULATORY_CARE_PROVIDER_SITE_OTHER): Payer: Commercial Managed Care - PPO

## 2018-12-26 ENCOUNTER — Other Ambulatory Visit: Payer: Self-pay

## 2018-12-26 DIAGNOSIS — E1169 Type 2 diabetes mellitus with other specified complication: Secondary | ICD-10-CM | POA: Diagnosis not present

## 2018-12-26 DIAGNOSIS — E669 Obesity, unspecified: Secondary | ICD-10-CM | POA: Diagnosis not present

## 2018-12-26 LAB — BASIC METABOLIC PANEL
BUN: 20 mg/dL (ref 6–23)
CO2: 27 mEq/L (ref 19–32)
Calcium: 9.2 mg/dL (ref 8.4–10.5)
Chloride: 105 mEq/L (ref 96–112)
Creatinine, Ser: 1.13 mg/dL (ref 0.40–1.50)
GFR: 65.55 mL/min (ref >60.00)
Glucose, Bld: 119 mg/dL — ABNORMAL HIGH (ref 70–99)
Potassium: 4.2 mEq/L (ref 3.5–5.1)
Sodium: 139 mEq/L (ref 135–145)

## 2018-12-26 LAB — MICROALBUMIN / CREATININE URINE RATIO
Creatinine,U: 131.1 mg/dL
Microalb Creat Ratio: 0.5 mg/g (ref 0.0–30.0)
Microalb, Ur: <0.7 mg/dL (ref 0.0–1.9)

## 2018-12-26 LAB — HEMOGLOBIN A1C: Hgb A1c MFr Bld: 6.6 % — ABNORMAL HIGH (ref 4.6–6.5)

## 2018-12-27 ENCOUNTER — Other Ambulatory Visit: Payer: Self-pay | Admitting: Interventional Cardiology

## 2018-12-27 NOTE — Progress Notes (Signed)
Patient ID: Henry Rivera, male   DOB: 11-23-1955, 62 y.o.   MRN: [id number]            Reason for Appointment: Follow-up for Type 2 Diabetes    History of Present Illness:          Date of diagnosis of type 2 diabetes mellitus: 2007      Background history:   His A1c was apparently 12.7 at the time of diagnosis when he had increased thirst and urination He was started on Janumet and apparently continued unchanged until last year, this was stopped because of high out-of-pocket expense He was subsequently on metformin only and then Jardiance 10 mg started His A1c in 2017 was 6.7 twice, previous records not available.  Recent history:       Non-insulin hypoglycemic drugs the patient is taking are: Trulicity 1.19 mg weekly, metformin 1 g twice a day   His A1c is 6.6    He is here for the 81-month follow-up   Current management, blood sugar patterns and problems identified:   He has been able to lose some weight since his last visit  Although he thinks that his blood sugars are averaging relatively low he is likely checking more readings fasting and after dinner but not other times  In the morning he is usually eating cereal on the weekdays  He did have an episode of feeling shaky before lunch with blood sugar of 63  Otherwise recently highest blood sugar is only 135  Nonfasting glucose was 119  Blood sugars were not accurately analyzed and he appears to have readings below 100 also after dinner  AVERAGE for the last 30 days is 112  He says he does want to lose more weight       Side effects from medications have been: Nausea from high-dose Trulicity  Compliance with the medical regimen: Usually good  Glucose monitoring:  done 1-2 times a day         Glucometer: One Touch ultra mini .      Blood Glucose readings by review of his smart phone app as follows   PRE-MEAL Fasting Lunch Dinner Bedtime Overall  Glucose range:  105-128  63     Mean/median:     112   POST-MEAL PC Breakfast PC Lunch PC Dinner  Glucose range:    93-135  Mean/median:        PREVIOUS MEDIAN 119  Self-care: The diet that the patient has been following is: tries to limit high-fat foods .                  Dietician visit, most recent: none, previously attended classes at cardiac rehabilitation               Exercise:  Walking 4-7 miles daily  Weight history: Maximum weight had been 318  Wt Readings from Last 3 Encounters:  12/28/18 261 lb (118.4 kg)  08/29/18 269 lb 3.2 oz (122.1 kg)  04/27/18 266 lb (120.7 kg)    Glycemic control:    Lab Results  Component Value Date   HGBA1C 6.6 (H) 12/26/2018   HGBA1C 6.5 08/25/2018   HGBA1C 6.5 (A) 04/06/2018   Lab Results  Component Value Date   MICROALBUR <0.7 12/26/2018   [place] 22 08/25/2018   CREATININE 1.13 12/26/2018   Lab Results  Component Value Date   MICRALBCREAT 0.5 12/26/2018     Other active problems: See review of systems   Allergies as  of 12/28/2018      Reactions   Augmentin [amoxicillin-pot Clavulanate]    Gi sx      Medication List       Accurate as of Dec 28, 2018  8:32 AM. If you have any questions, ask your nurse or doctor.        aspirin 81 MG tablet Take 81 mg by mouth daily.   CINNAMON PO Take 1,000 capsules by mouth daily.   Dulaglutide 0.75 MG/0.5ML Sopn Commonly known as:  Trulicity INJECT 9.60 MG INTO THE SKIN ONCE A WEEK.   esomeprazole 40 MG capsule Commonly known as:  NEXIUM Take 40 mg by mouth daily at 12 noon.   fexofenadine 180 MG tablet Commonly known as:  ALLEGRA Take 180 mg by mouth daily.   Flonase 50 MCG/ACT nasal spray Generic drug:  fluticasone Place 2 sprays into both nostrils as needed for allergies or rhinitis.   glucose blood test strip Use as instructed to check blood sugar 1 time daily as needed.   OneTouch Verio test strip Generic drug:  glucose blood CHECK SUGAR 2 TIMES A DAY.   hydrochlorothiazide 12.5 MG capsule Commonly  known as:  MICROZIDE Take 1 capsule (12.5 mg total) by mouth daily. Please make annual appt with Dr. Tamala Julian for future refills. Thank you   metFORMIN 1000 MG tablet Commonly known as:  GLUCOPHAGE Take 1,000 mg by mouth 2 (two) times daily with a meal.   nitroGLYCERIN 0.4 MG SL tablet Commonly known as:  NITROSTAT Place 1 tablet (0.4 mg total) under the tongue every 5 (five) minutes as needed for chest pain.   POTASSIMIN PO Take 595 tablets by mouth daily.   promethazine 25 MG tablet Commonly known as:  PHENERGAN Take 25 mg by mouth every 6 (six) hours as needed for nausea or vomiting. TAKE 1 TABLET BY MOUTH EVERY 6 HOURS AS NEEDED FOR NAUSEA AND VOMITING.   rosuvastatin 5 MG tablet Commonly known as:  CRESTOR TAKE 1 TABLET EVERY OTHER DAY ORALLY   valsartan 320 MG tablet Commonly known as:  DIOVAN Take 320 mg by mouth daily.   VITAMIN D PO Take by mouth. 1000 units once daily       Allergies:  Allergies  Allergen Reactions  . Augmentin [Amoxicillin-Pot Clavulanate]     Gi sx    Past Medical History:  Diagnosis Date  . Allergic rhinitis   . Allergy   . CAD (coronary artery disease) 4/12   WITH LAD DES   . Diabetes ([place]) 2007  . Elevated cholesterol    pt denies this 11-11-15- states his cholesterol is fine   . GERD (gastroesophageal reflux disease)   . Hyperlipidemia   . Hypertension   . MI (mitral incompetence)   . Vitamin D deficiency     Past Surgical History:  Procedure Laterality Date  . COLONOSCOPY  03-27-2004  . CORONARY ANGIOPLASTY WITH STENT PLACEMENT  11-2010  . VASECTOMY      Family History  Problem Relation Age of Onset  . Hepatitis Brother   . Diabetes Paternal Grandfather   . Heart failure Paternal Grandfather        CHF  . Colon cancer Neg Hx   . Colon polyps Neg Hx     Social History:  reports that he has never smoked. He has never used smokeless tobacco. He reports that he does not drink alcohol or use drugs.   Review of Systems    Lipid history:  Baseline cholesterol has  been normal, started on Crestor for history of CAD, currently only on 5 mg every other day as prescribed by PCP Followed by PCP  Levels as follows:    Lab Results  Component Value Date   CHOL 75 08/25/2018   HDL 36.80 (L) 08/25/2018   LDLCALC 22 08/25/2018   TRIG 79.0 08/25/2018   CHOLHDL 2 08/25/2018           Hypertension: Treated with Diovan 320 mg, HCTZ qod He has had long-standing hypertension and is followed by PCP/cardiology He says that since starting HCTZ whenever he bends down and gets up he gets dizzy  Most recent eye exam was 5/19, sees Dr. Herbert Deaner  Most recent foot exam: 12/2017   LABS:  Lab on 12/26/2018  Component Date Value Ref Range Status  . Sodium 12/26/2018 139  135 - 145 mEq/L Final  . Potassium 12/26/2018 4.2  3.5 - 5.1 mEq/L Final  . Chloride 12/26/2018 105  96 - 112 mEq/L Final  . CO2 12/26/2018 27  19 - 32 mEq/L Final  . Glucose, Bld 12/26/2018 119* 70 - 99 mg/dL Final  . BUN 12/26/2018 20  6 - 23 mg/dL Final  . Creatinine, Ser 12/26/2018 1.13  0.40 - 1.50 mg/dL Final  . Calcium 12/26/2018 9.2  8.4 - 10.5 mg/dL Final  . GFR 12/26/2018 65.55  >60.00 mL/min Final  . Hgb A1c MFr Bld 12/26/2018 6.6* 4.6 - 6.5 % Final   Glycemic Control Guidelines for People with Diabetes:Non Diabetic:  <6%Goal of Therapy: <7%Additional Action Suggested:  >8%   . Microalb, Ur 12/26/2018 <0.7  0.0 - 1.9 mg/dL Final  . Creatinine,U 12/26/2018 131.1  mg/dL Final  . Microalb Creat Ratio 12/26/2018 0.5  0.0 - 30.0 mg/g Final    Physical Examination:  BP 110/70 (BP Location: Left Arm, Patient Position: Sitting, Cuff Size: Normal)   Pulse 81   Ht 6\' 5"  (1.956 m)   Wt 261 lb (118.4 kg)   SpO2 96%   BMI 30.95 kg/m    ASSESSMENT:  Diabetes type 2, on Trulicity 4.12 and Metformin  See history of present illness for detailed discussion of current diabetes management, blood sugar patterns and problems identified  His A1c is  consistently controlled, now 6.6  He has excellent blood sugars at home although may be mildly increased fasting  Usually postprandial readings are good and his diet has been mostly well bland and low in calories He may have rare symptoms of hypoglycemia before lunch since he is eating mostly cereal in the morning without any protein He has now started losing weight again with increased walking for exercise   HYPERTENSION: He is on HCTZ which he thinks causes lightheadedness bending down and is taking this every other day However because of his history of CAD and known diabetes he may do better with using Jardiance or Invokana instead of HCTZ and he will discuss with cardiologist  PLAN:    More blood sugar readings after breakfast and lunch to be checked Cut back on cereal and have more protein such as low-fat dairy or eggs in the morning Call if blood sugars are consistently higher  There are no Patient Instructions on file for this visit.    Elayne Snare 12/28/2018, 8:32 AM   Note: This office note was prepared with Dragon voice recognition system technology. Any transcriptional errors that result from this process are unintentional.

## 2018-12-28 ENCOUNTER — Other Ambulatory Visit: Payer: Self-pay

## 2018-12-28 ENCOUNTER — Encounter: Payer: Self-pay | Admitting: Endocrinology

## 2018-12-28 ENCOUNTER — Ambulatory Visit: Payer: Commercial Managed Care - PPO | Admitting: Endocrinology

## 2018-12-28 VITALS — BP 110/70 | HR 81 | Ht 77.0 in | Wt 261.0 lb

## 2018-12-28 DIAGNOSIS — E1169 Type 2 diabetes mellitus with other specified complication: Secondary | ICD-10-CM | POA: Diagnosis not present

## 2018-12-28 DIAGNOSIS — E669 Obesity, unspecified: Secondary | ICD-10-CM | POA: Diagnosis not present

## 2019-01-05 ENCOUNTER — Other Ambulatory Visit: Payer: Self-pay | Admitting: Endocrinology

## 2019-01-05 LAB — HM DIABETES EYE EXAM

## 2019-01-16 ENCOUNTER — Ambulatory Visit: Payer: Commercial Managed Care - PPO | Admitting: *Deleted

## 2019-01-16 ENCOUNTER — Other Ambulatory Visit: Payer: Self-pay

## 2019-01-16 VITALS — Ht 77.0 in | Wt 262.0 lb

## 2019-01-16 DIAGNOSIS — Z86010 Personal history of colonic polyps: Secondary | ICD-10-CM

## 2019-01-16 MED ORDER — SUPREP BOWEL PREP KIT 17.5-3.13-1.6 GM/177ML PO SOLN: 1.0000 | Freq: Once | ORAL | 0 refills | Status: AC

## 2019-01-16 NOTE — Progress Notes (Signed)
No egg or soy allergy known to patient  No issues with past sedation with any surgeries  or procedures, no intubation problems  No diet pills per patient No home 02 use per patient  No blood thinners per patient  Pt denies issues with constipation  No A fib or A flutter  EMMI video sent to pt's e mail    Pt is aware that care partner will wait in the car during parking lot; if they feel like they will be too hot to wait in the car; they may wait in the lobby.  We want them to wear a mask (we do not have any that we can provide them), practice social distancing, and we will check their temperatures when they get here.  I did remind patient that their care partner needs to stay in the parking lot the entire time. Pt will wear mask into building  Pt verified name, DOB, address and insurance during PV today. Pt mailed instruction packet to included paper to complete and mail back to [facility] with addressed and stamped envelope, Emmi video, copy of consent form to read and not return, and instructions. Suprep $15  coupon mailed in packet. PV completed over the phone. Pt encouraged to call with questions or issues

## 2019-01-19 ENCOUNTER — Encounter: Payer: Self-pay | Admitting: Gastroenterology

## 2019-01-29 ENCOUNTER — Telehealth: Payer: Self-pay | Admitting: Gastroenterology

## 2019-01-29 NOTE — Telephone Encounter (Signed)

## 2019-01-30 ENCOUNTER — Other Ambulatory Visit: Payer: Self-pay

## 2019-01-30 ENCOUNTER — Encounter: Payer: Self-pay | Admitting: Gastroenterology

## 2019-01-30 ENCOUNTER — Ambulatory Visit (AMBULATORY_SURGERY_CENTER): Payer: Commercial Managed Care - PPO | Admitting: Gastroenterology

## 2019-01-30 VITALS — BP 115/76 | HR 66 | Temp 97.8°F | Resp 9 | Ht 77.0 in | Wt 261.0 lb

## 2019-01-30 DIAGNOSIS — D120 Benign neoplasm of cecum: Secondary | ICD-10-CM | POA: Diagnosis not present

## 2019-01-30 DIAGNOSIS — D123 Benign neoplasm of transverse colon: Secondary | ICD-10-CM | POA: Diagnosis not present

## 2019-01-30 DIAGNOSIS — Z86010 Personal history of colonic polyps: Secondary | ICD-10-CM | POA: Diagnosis present

## 2019-01-30 DIAGNOSIS — K635 Polyp of colon: Secondary | ICD-10-CM | POA: Diagnosis not present

## 2019-01-30 MED ORDER — SODIUM CHLORIDE 0.9 % IV SOLN: 500.0000 mL | Freq: Once | INTRAVENOUS | Status: DC

## 2019-01-30 NOTE — Progress Notes (Signed)
Pt's states no medical or surgical changes since previsit or office visit. 

## 2019-01-30 NOTE — Op Note (Signed)
[facility] Patient Name: Henry Rivera Procedure Date: 01/30/2019 8:10 AM MRN: [id number] Endoscopist: Remo Lipps P. Havery Moros , MD Age: 63 Referring MD:  Date of Birth: 1955-08-08 Gender: Male Account #: [id number] Procedure:                Colonoscopy Indications:              Surveillance: Personal history of adenomatous                            polyps on last colonoscopy 3 years ago Medicines:                Monitored Anesthesia Care Procedure:                Pre-Anesthesia Assessment:                           - Prior to the procedure, a History and Physical                            was performed, and patient medications and                            allergies were reviewed. The patient's tolerance of                            previous anesthesia was also reviewed. The risks                            and benefits of the procedure and the sedation                            options and risks were discussed with the patient.                            All questions were answered, and informed consent                            was obtained. Prior Anticoagulants: The patient has                            taken no previous anticoagulant or antiplatelet                            agents. ASA Grade Assessment: III - A patient with                            severe systemic disease. After reviewing the risks                            and benefits, the patient was deemed in                            satisfactory condition to undergo the procedure.  After obtaining informed consent, the colonoscope                            was passed under direct vision. Throughout the                            procedure, the patient's blood pressure, pulse, and                            oxygen saturations were monitored continuously. The                            Colonoscope was introduced through the anus and                            advanced to the  the cecum, identified by                            appendiceal orifice and ileocecal valve. The                            colonoscopy was performed without difficulty. The                            patient tolerated the procedure well. The quality                            of the bowel preparation was adequate. The                            ileocecal valve, appendiceal orifice, and rectum                            were photographed. Scope In: 8:16:48 AM Scope Out: 0:25:42 AM Scope Withdrawal Time: 0 hours 14 minutes 47 seconds  Total Procedure Duration: 0 hours 20 minutes 1 second  Findings:                 The perianal and digital rectal examinations were                            normal.                           A diminutive polyp was found in the cecum. The                            polyp was sessile. The polyp was removed with a                            cold snare. Resection and retrieval were complete.                           A 3 mm polyp was found in the ascending colon. The  polyp was sessile. The polyp was removed with a                            cold snare. Resection and retrieval were complete.                           Internal hemorrhoids were found during retroflexion.                           A few small-mouthed diverticula were found in the                            sigmoid colon.                           The exam was otherwise without abnormality. Complications:            No immediate complications. Estimated blood loss:                            Minimal. Estimated Blood Loss:     Estimated blood loss was minimal. Impression:               - One diminutive polyp in the cecum, removed with a                            cold snare. Resected and retrieved.                           - One 3 mm polyp in the ascending colon, removed                            with a cold snare. Resected and retrieved.                           -  Internal hemorrhoids.                           - Diverticulosis in the sigmoid colon.                           - The examination was otherwise normal. Recommendation:           - Patient has a contact number available for                            emergencies. The signs and symptoms of potential                            delayed complications were discussed with the                            patient. Return to normal activities tomorrow.                            Written discharge instructions were provided  to the                            patient.                           - Resume previous diet.                           - Continue present medications.                           - Await pathology results. Remo Lipps P. Armbruster, MD 01/30/2019 8:41:10 AM This report has been signed electronically.

## 2019-01-30 NOTE — Progress Notes (Signed)
Courtney Wshington did temps and Rica Mote took vitals.

## 2019-01-30 NOTE — Progress Notes (Signed)
Called to room to assist during endoscopic procedure.  Patient ID and intended procedure confirmed with present staff. Received instructions for my participation in the procedure from the performing physician.  

## 2019-01-30 NOTE — Patient Instructions (Signed)
Discharge instructions given. Handouts on polyps and hemorrhoids. Resume previous medications. YOU HAD AN ENDOSCOPIC PROCEDURE TODAY AT [facility]:   Refer to the procedure report that was given to you for any specific questions about what was found during the examination.  If the procedure report does not answer your questions, please call your gastroenterologist to clarify.  If you requested that your care partner not be given the details of your procedure findings, then the procedure report has been included in a sealed envelope for you to review at your convenience later.  YOU SHOULD EXPECT: Some feelings of bloating in the abdomen. Passage of more gas than usual.  Walking can help get rid of the air that was put into your GI tract during the procedure and reduce the bloating. If you had a lower endoscopy (such as a colonoscopy or flexible sigmoidoscopy) you may notice spotting of blood in your stool or on the toilet paper. If you underwent a bowel prep for your procedure, you may not have a normal bowel movement for a few days.  Please Note:  You might notice some irritation and congestion in your nose or some drainage.  This is from the oxygen used during your procedure.  There is no need for concern and it should clear up in a day or so.  SYMPTOMS TO REPORT IMMEDIATELY:   Following lower endoscopy (colonoscopy or flexible sigmoidoscopy):  Excessive amounts of blood in the stool  Significant tenderness or worsening of abdominal pains  Swelling of the abdomen that is new, acute  Fever of 100F or higher   For urgent or emergent issues, a gastroenterologist can be reached at any hour by calling [phone].   DIET:  We do recommend a small meal at first, but then you may proceed to your regular diet.  Drink plenty of fluids but you should avoid alcoholic beverages for 24 hours.  ACTIVITY:  You should plan to take it easy for the rest of today and you should NOT DRIVE  or use heavy machinery until tomorrow (because of the sedation medicines used during the test).    FOLLOW UP: Our staff will call the number listed on your records 48-72 hours following your procedure to check on you and address any questions or concerns that you may have regarding the information given to you following your procedure. If we do not reach you, we will leave a message.  We will attempt to reach you two times.  During this call, we will ask if you have developed any symptoms of COVID 19. If you develop any symptoms (ie: fever, flu-like symptoms, shortness of breath, cough etc.) before then, please call [phone].  If you test positive for Covid 19 in the 2 weeks post procedure, please call and report this information to [place].    If any biopsies were taken you will be contacted by phone or by letter within the next 1-3 weeks.  Please call us at [phone] if you have not heard about the biopsies in 3 weeks.    SIGNATURES/CONFIDENTIALITY: You and/or your care partner have signed paperwork which will be entered into your electronic medical record.  These signatures attest to the fact that that the information above on your After Visit Summary has been reviewed and is understood.  Full responsibility of the confidentiality of this discharge information lies with you and/or your care-partner.

## 2019-01-30 NOTE — Progress Notes (Signed)
A/ox3, pleased with MAC, report to RN 

## 2019-02-01 ENCOUNTER — Encounter: Payer: Self-pay | Admitting: Gastroenterology

## 2019-02-01 ENCOUNTER — Telehealth: Payer: Self-pay

## 2019-02-01 NOTE — Telephone Encounter (Signed)
  Follow up Call-  Call back number 01/30/2019  Post procedure Call Back phone  # [phone]  Permission to leave phone message Yes  Some recent data might be hidden     Patient questions:  Do you have a fever, pain , or abdominal swelling? No. Pain Score  0 *  Have you tolerated food without any problems? Yes.    Have you been able to return to your normal activities? Yes.    Do you have any questions about your discharge instructions: Diet   No. Medications  No. Follow up visit  No.  Do you have questions or concerns about your Care? No.  Actions: * If pain score is 4 or above: No action needed, pain <4.  1. Have you developed a fever since your procedure? no  2.   Have you had an respiratory symptoms (SOB or cough) since your procedure? no  3.   Have you tested positive for COVID 19 since your procedure no  4.   Have you had any family members/close contacts diagnosed with the COVID 19 since your procedure?  no   If yes to any of these questions please route to Joylene John, RN and Alphonsa Gin, Therapist, sports.

## 2019-03-28 ENCOUNTER — Other Ambulatory Visit: Payer: Self-pay | Admitting: Interventional Cardiology

## 2019-04-10 ENCOUNTER — Telehealth: Payer: Self-pay | Admitting: Endocrinology

## 2019-04-10 NOTE — Telephone Encounter (Signed)
Called pt and left voicemail detailing MD message.

## 2019-04-10 NOTE — Telephone Encounter (Signed)
Pt is asking if we can let Dr. Shelia Media know what labs we need so he doesn't have to do labs with Dr. Shelia Media and with [place] only 2 wks apart?

## 2019-04-10 NOTE — Telephone Encounter (Signed)
Pending labs are already in the chart.  These are BMP and A1c

## 2019-04-19 NOTE — Progress Notes (Signed)
CARDIOLOGY OFFICE NOTE  Date:  04/23/2019    Henry Rivera Date of Birth: 10-19-1955 Medical Record [id number]  PCP:  Deland Pretty, MD  Cardiologist:  Jennings Books   Chief Complaint  Patient presents with  . Follow-up    History of Present Illness: Henry Rivera is a 63 y.o. male who presents today for a one year check. Seen for Dr. Tamala Julian.   He has a history of CAD with prior LAD DES in 2012, DM, HLD and HTN.   He last saw Dr. Tamala Julian in 2018. I saw him in September of 2019 and he was doing well. He had had 2 spells of feeling like he would pass out - both occurred with bending over while playing golf. Otherwise, he was doing well. He does hold his HCTZ on days where he has potential to get over heated.   The patient does not have symptoms concerning for COVID-19 infection (fever, chills, cough, or new shortness of breath).   Comes in today. Here alone. He is doing well. 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 Needs NTG refilled today - not using.  No chest pain. Breathing is ok. He has had less spells of getting dizzy with bending over. No syncope. HCTZ as been cut back. His weight is down to 258 at home. Labs are checked by PCP and he is going there later this week for that. Overall, he feels like he is doing well.   Past Medical History:  Diagnosis Date  . Allergic rhinitis   . Allergy   . CAD (coronary artery disease) 4/12   WITH LAD DES   . Cataract    forming- small   . Diabetes ([place]) 2007  . Elevated cholesterol    pt denies this 11-11-15- states his cholesterol is fine   . GERD (gastroesophageal reflux disease)   . Hyperlipidemia    pt states uses Crestor due to Stent - not HLD   . Hypertension   . MI (mitral incompetence)   . Vitamin D deficiency     Past Surgical History:  Procedure Laterality Date  . COLONOSCOPY  03-27-2004   2017- #2  .  CORONARY ANGIOPLASTY WITH STENT PLACEMENT  11-2010  . POLYPECTOMY    . VASECTOMY       Medications: Current Meds  Medication Sig  . aspirin 81 MG tablet Take 81 mg by mouth daily.  . Cholecalciferol (VITAMIN D PO) Take by mouth. 1000 units once daily  . CINNAMON PO Take 1,000 capsules by mouth daily.  Marland Kitchen esomeprazole (NEXIUM) 40 MG capsule Take 40 mg by mouth daily at 12 noon.  . fexofenadine (ALLEGRA) 180 MG tablet Take 180 mg by mouth daily.  . fluticasone (FLONASE) 50 MCG/ACT nasal spray Place 2 sprays into both nostrils as needed for allergies or rhinitis.  Marland Kitchen glucose blood test strip Use as instructed to check blood sugar 1 time daily as needed.  . hydrochlorothiazide (MICROZIDE) 12.5 MG capsule Take 12.5 mg by mouth every other day.  . metFORMIN (GLUCOPHAGE) 1000 MG tablet Take 1,000 mg by mouth 2 (two) times daily with a meal.  . nitroGLYCERIN (NITROSTAT) 0.4 MG SL tablet Place 1 tablet (0.4 mg total) under the tongue every 5 (five) minutes as needed for chest pain.  Glory Rosebush VERIO test strip CHECK SUGAR 2 TIMES A DAY.  . rosuvastatin (  CRESTOR) 5 MG tablet TAKE 1 TABLET EVERY OTHER DAY ORALLY  . TRULICITY A999333 0000000 SOPN INJECT 0.75 MG INTO THE SKIN ONCE A WEEK.  . valsartan (DIOVAN) 320 MG tablet Take 320 mg by mouth daily.  . [DISCONTINUED] hydrochlorothiazide (MICROZIDE) 12.5 MG capsule Take 1 capsule (12.5 mg total) by mouth daily. Please keep upcoming appt in September for future refills. Thank you  . [DISCONTINUED] nitroGLYCERIN (NITROSTAT) 0.4 MG SL tablet Place 1 tablet (0.4 mg total) under the tongue every 5 (five) minutes as needed for chest pain.     Allergies: Allergies  Allergen Reactions  . Augmentin [Amoxicillin-Pot Clavulanate]     Gi sx    Social History: The patient  reports that he has never smoked. He has never used smokeless tobacco. He reports that he does not drink alcohol or use drugs.   Family History: The patient's family history includes  Diabetes in his paternal grandfather; Heart failure in his paternal grandfather; Hepatitis in his brother.   Review of Systems: Please see the history of present illness.   All other systems are reviewed and negative.   Physical Exam: VS:  Pulse 69   Ht 6\' 5"  (1.956 m)   Wt 262 lb 1.9 oz (118.9 kg)   BMI 31.08 kg/m  .  BMI Body mass index is 31.08 kg/m.  Wt Readings from Last 3 Encounters:  04/23/19 262 lb 1.9 oz (118.9 kg)  01/30/19 261 lb (118.4 kg)  01/16/19 262 lb (118.8 kg)    General: Pleasant. Well developed, well nourished and in no acute distress. His weight is down from 269 last year.   HEENT: Normal.  Neck: Supple, no JVD, carotid bruits, or masses noted.  Cardiac: Regular rate and rhythm. No murmurs, rubs, or gallops. No edema.  Respiratory:  Lungs are clear to auscultation bilaterally with normal work of breathing.  GI: Soft and nontender.  MS: No deformity or atrophy. Gait and ROM intact.  Skin: Warm and dry. Color is normal.  Neuro:  Strength and sensation are intact and no gross focal deficits noted.  Psych: Alert, appropriate and with normal affect.   LABORATORY DATA:  EKG:  EKG is ordered today. This demonstrates NSR.  Lab Results  Component Value Date   WBC 5.6 04/06/2018   HGB 14.5 04/06/2018   HCT 43 04/06/2018   PLT 201 04/06/2018   GLUCOSE 119 (H) 12/26/2018   CHOL 75 08/25/2018   TRIG 79.0 08/25/2018   HDL 36.80 (L) 08/25/2018   LDLCALC 22 08/25/2018   ALT 29 04/06/2018   AST 20 04/06/2018   NA 139 12/26/2018   K 4.2 12/26/2018   CL 105 12/26/2018   CREATININE 1.13 12/26/2018   BUN 20 12/26/2018   CO2 27 12/26/2018   HGBA1C 6.6 (H) 12/26/2018   MICROALBUR <0.7 12/26/2018       BNP (last 3 results) No results for input(s): BNP in the last 8760 hours.  ProBNP (last 3 results) No results for input(s): PROBNP in the last 8760 hours.   Other Studies Reviewed Today:  GXT Study Highlights 09/2017    Blood pressure  demonstrated a hypertensive response to exercise.  There was no ST segment deviation noted during stress.  No ischemia. Hypertensive response to exercise. Normal exercise capacity.     CARDIAC CATH CONCLUSIONS 2012: 1. Coronary atherosclerosis with high-grade proximal LAD stenosis producing an 85% lesion reduced to 0% with TIMI grade 3 flow. 2. Widely patent circumflex and right coronary. 3. Normal LV  function.  PLAN: Aspirin and Effient indefinitely given location of the stent. Hopeful discharge in the a.m.   Belva Crome, M.D.  Assessment/Plan:  1. CAD - with remote LAD stenting - he is doing well. No active symptoms. Continue with CV risk factor modification. NTG refilled for him today.   2. HTN - BP is ok by me - he has had some prior dizziness/presyncope when out in the excessive heat - this seems better with reduction/holding of HCTZ. No changes made today.   3. HLD - on statin - due for upcoming labs with his PCP  4. DM - A1C down to less than 7 now - he continues to work really hard at taking care of himself. He sees Endocrine as well.   5. COVID-19 Education: The signs and symptoms of COVID-19 were discussed with the patient and how to seek care for testing (follow up with PCP or arrange E-visit).  The importance of social distancing, staying at home, hand hygiene and wearing a mask when out in public were discussed today.  Current medicines are reviewed with the patient today.  The patient does not have concerns regarding medicines other than what has been noted above.  The following changes have been made:  See above.  Labs/ tests ordered today include:    Orders Placed This Encounter  Procedures  . EKG 12-Lead     Disposition:   FU with Dr. Tamala Julian in one year.   Patient is agreeable to this plan and will call if any problems develop in the interim.   SignedTruitt Merle, NP  04/23/2019 11:46 AM  [facility]  [address] [place], [place]  [address] Phone: [phone] Fax: [phone]

## 2019-04-23 ENCOUNTER — Ambulatory Visit: Payer: Commercial Managed Care - PPO | Admitting: Nurse Practitioner

## 2019-04-23 ENCOUNTER — Encounter: Payer: Self-pay | Admitting: Nurse Practitioner

## 2019-04-23 ENCOUNTER — Other Ambulatory Visit: Payer: Self-pay

## 2019-04-23 ENCOUNTER — Encounter (INDEPENDENT_AMBULATORY_CARE_PROVIDER_SITE_OTHER): Payer: Self-pay

## 2019-04-23 VITALS — HR 69 | Ht 77.0 in | Wt 262.1 lb

## 2019-04-23 DIAGNOSIS — Z7189 Other specified counseling: Secondary | ICD-10-CM

## 2019-04-23 DIAGNOSIS — I2510 Atherosclerotic heart disease of native coronary artery without angina pectoris: Secondary | ICD-10-CM | POA: Diagnosis not present

## 2019-04-23 DIAGNOSIS — E7849 Other hyperlipidemia: Secondary | ICD-10-CM

## 2019-04-23 DIAGNOSIS — I10 Essential (primary) hypertension: Secondary | ICD-10-CM | POA: Diagnosis not present

## 2019-04-23 MED ORDER — NITROGLYCERIN 0.4 MG SL SUBL: 0.4000 mg | SUBLINGUAL_TABLET | SUBLINGUAL | 6 refills | Status: AC | PRN

## 2019-04-23 NOTE — Patient Instructions (Addendum)
After Visit Summary:  We will be checking the following labs today - NONE   Medication Instructions:    Continue with your current medicines.   I did send in your refill for NTG today.    If you need a refill on your cardiac medications before your next appointment, please call your pharmacy.     Testing/Procedures To Be Arranged:  N/A  Follow-Up:   See Dr. Tamala Julian in one year. You will receive a reminder letter in the mail two months in advance. If you don't receive a letter, please call our office to schedule the follow-up appointment.   At [facility], you and your health needs are our priority.  As part of our continuing mission to provide you with exceptional heart care, we have created designated Provider Care Teams.  These Care Teams include your primary Cardiologist (physician) and Advanced Practice Providers (APPs -  Physician Assistants and Nurse Practitioners) who all work together to provide you with the care you need, when you need it.  Special Instructions:  . Stay safe, stay home, wash your hands for at least 20 seconds and wear a mask when out in public.  . It was good to talk with you today.    Call the [place] office at [phone] if you have any questions, problems or concerns.

## 2019-05-04 ENCOUNTER — Telehealth: Payer: Self-pay | Admitting: Gastroenterology

## 2019-05-04 NOTE — Telephone Encounter (Signed)
Returned patient's phone call, he said he is at lunch and can't talk. He will call us back

## 2019-05-04 NOTE — Telephone Encounter (Signed)
Pt reported that he is experiencing soft stools post colon.  Please advise.

## 2019-05-08 ENCOUNTER — Other Ambulatory Visit: Payer: Commercial Managed Care - PPO

## 2019-05-08 NOTE — Telephone Encounter (Signed)
Patient called and states he has had mussy stools for about 3  Months. Normal brown color, No mucus or blood. No abdominal pain. His PCP had him start probiotic and fiber supplement. Also did some stool testing that he says came back neg. His PCP suggested he contact us.

## 2019-05-08 NOTE — Telephone Encounter (Signed)
I agree with the fiber supplement. If the probiotic has not helped yet then can stop it. I think he has had a functional change in his bowel, colonoscopy otherwise looked okay. He could try a 1/2 dose of immodium in the AM and see what happens if he wants more formed stool. Thanks

## 2019-05-08 NOTE — Telephone Encounter (Signed)
Pt returned your call.  

## 2019-05-09 NOTE — Telephone Encounter (Signed)
Called patient and gave Dr. Doyne Keel recommendations, he is in agreement

## 2019-05-10 ENCOUNTER — Ambulatory Visit: Payer: Commercial Managed Care - PPO | Admitting: Endocrinology

## 2019-05-10 ENCOUNTER — Other Ambulatory Visit: Payer: Self-pay

## 2019-05-10 DIAGNOSIS — Z20822 Contact with and (suspected) exposure to covid-19: Secondary | ICD-10-CM

## 2019-05-11 LAB — NOVEL CORONAVIRUS, NAA: SARS-CoV-2, NAA: NOT DETECTED

## 2019-05-18 ENCOUNTER — Other Ambulatory Visit: Payer: Self-pay

## 2019-05-18 DIAGNOSIS — Z20822 Contact with and (suspected) exposure to covid-19: Secondary | ICD-10-CM

## 2019-05-19 LAB — NOVEL CORONAVIRUS, NAA: SARS-CoV-2, NAA: NOT DETECTED

## 2019-06-09 ENCOUNTER — Other Ambulatory Visit: Payer: Self-pay | Admitting: Endocrinology

## 2019-06-23 ENCOUNTER — Other Ambulatory Visit: Payer: Self-pay | Admitting: Interventional Cardiology

## 2019-08-23 ENCOUNTER — Other Ambulatory Visit: Payer: Self-pay | Admitting: Endocrinology

## 2019-09-25 ENCOUNTER — Other Ambulatory Visit: Payer: Self-pay

## 2019-09-25 ENCOUNTER — Other Ambulatory Visit (INDEPENDENT_AMBULATORY_CARE_PROVIDER_SITE_OTHER): Payer: Commercial Managed Care - PPO

## 2019-09-25 DIAGNOSIS — E669 Obesity, unspecified: Secondary | ICD-10-CM | POA: Diagnosis not present

## 2019-09-25 DIAGNOSIS — E1169 Type 2 diabetes mellitus with other specified complication: Secondary | ICD-10-CM

## 2019-09-25 LAB — BASIC METABOLIC PANEL
BUN: 18 mg/dL (ref 6–23)
CO2: 28 mEq/L (ref 19–32)
Calcium: 9.7 mg/dL (ref 8.4–10.5)
Chloride: 103 mEq/L (ref 96–112)
Creatinine, Ser: 1.25 mg/dL (ref 0.40–1.50)
GFR: 58.2 mL/min — ABNORMAL LOW (ref >60.00)
Glucose, Bld: 151 mg/dL — ABNORMAL HIGH (ref 70–99)
Potassium: 4.3 mEq/L (ref 3.5–5.1)
Sodium: 137 mEq/L (ref 135–145)

## 2019-09-25 LAB — HEMOGLOBIN A1C: Hgb A1c MFr Bld: 7.1 % — ABNORMAL HIGH (ref 4.6–6.5)

## 2019-09-27 ENCOUNTER — Other Ambulatory Visit: Payer: Self-pay

## 2019-09-30 NOTE — Progress Notes (Signed)
Patient ID: Henry Rivera, male   DOB: 02-29-1956, 64 y.o.   MRN: [id number]            Reason for Appointment: Follow-up for Type 2 Diabetes   History of Present Illness:          Date of diagnosis of type 2 diabetes mellitus: 2007      Background history:   His A1c was apparently 12.7 at the time of diagnosis when he had increased thirst and urination He was started on Janumet and apparently continued unchanged until last year, this was stopped because of high out-of-pocket expense He was subsequently on metformin only and then Jardiance 10 mg started His A1c in 2017 was 6.7 twice, previous records not available.  Recent history:       Non-insulin hypoglycemic drugs the patient is taking are: Trulicity A999333 mg weekly, metformin 1 g twice a day   His A1c is 7.1 compared to 6.6    Current management, blood sugar patterns and problems identified:   He has not been able to follow-up since his last visit in 12/2018  He thinks his blood sugars have been gradually rising over the last 3 months  He appears to have gained about 8 pounds since his last visit  This is despite his saying that he is continuing to walk at least 10,000 steps a day and sometimes more  Also does not think his diet has changed  His fasting readings appear to be higher than at night; he does not think he is having excessive snacking late at night  However fasting readings are variable and as low as 84  Previously had abdominal distress and nausea from higher doses of Trulicity and he thinks that Jardiance did not work  He was told to discuss possibly using Jardiance with his cardiologist but he has not done so       Side effects from medications have been: Nausea from high-dose Trulicity  Compliance with the medical regimen: Usually good  Glucose monitoring:  done 1-2 times a day         Glucometer: One Touch ultra mini .      Blood Glucose readings    PRE-MEAL Fasting Lunch Dinner Bedtime  Overall  Glucose range:  84-193      Mean/median:  142    141   POST-MEAL PC Breakfast PC Lunch PC Dinner  Glucose range:    111-175  Mean/median:    139   PREVIOUS readings:  PRE-MEAL Fasting Lunch Dinner Bedtime Overall  Glucose range:  105-128  63     Mean/median:     112   POST-MEAL PC Breakfast PC Lunch PC Dinner  Glucose range:    93-135  Mean/median:       Self-care: The diet that the patient has been following is: tries to limit high-fat foods .                  Dietician visit, most recent: none, previously attended classes at cardiac rehabilitation               Exercise:  Walking 4-7 miles daily  Weight history: Maximum weight had been 318  Wt Readings from Last 3 Encounters:  10/01/19 269 lb (122 kg)  04/23/19 262 lb 1.9 oz (118.9 kg)  01/30/19 261 lb (118.4 kg)    Glycemic control:    Lab Results  Component Value Date   HGBA1C 7.1 (H) 09/25/2019   HGBA1C 6.6 (H)  12/26/2018   HGBA1C 6.5 08/25/2018   Lab Results  Component Value Date   MICROALBUR <0.7 12/26/2018   LDLCALC 22 08/25/2018   CREATININE 1.25 09/25/2019   Lab Results  Component Value Date   MICRALBCREAT 0.5 12/26/2018     Other active problems: See review of systems   Allergies as of 10/01/2019      Reactions   Augmentin [amoxicillin-pot Clavulanate]    Gi sx      Medication List       Accurate as of October 01, 2019  8:28 AM. If you have any questions, ask your nurse or doctor.        STOP taking these medications   valsartan 320 MG tablet Commonly known as: DIOVAN Stopped by: Elayne Snare, MD     TAKE these medications   aspirin 81 MG tablet Take 81 mg by mouth daily.   CINNAMON PO Take 1,000 capsules by mouth daily.   esomeprazole 40 MG capsule Commonly known as: NEXIUM Take 40 mg by mouth daily at 12 noon.   fexofenadine 180 MG tablet Commonly known as: ALLEGRA Take 180 mg by mouth daily.   Flonase 50 MCG/ACT nasal spray Generic drug: fluticasone Place 2  sprays into both nostrils as needed for allergies or rhinitis.   glucose blood test strip Use as instructed to check blood sugar 1 time daily as needed.   OneTouch Verio test strip Generic drug: glucose blood CHECK SUGAR 2 TIMES A DAY.   hydrochlorothiazide 12.5 MG capsule Commonly known as: MICROZIDE Take 1 capsule (12.5 mg total) by mouth every other day.   irbesartan 300 MG tablet Commonly known as: AVAPRO Take 300 mg by mouth daily.   metFORMIN 1000 MG tablet Commonly known as: GLUCOPHAGE Take 1,000 mg by mouth 2 (two) times daily with a meal.   nitroGLYCERIN 0.4 MG SL tablet Commonly known as: NITROSTAT Place 1 tablet (0.4 mg total) under the tongue every 5 (five) minutes as needed for chest pain.   rosuvastatin 5 MG tablet Commonly known as: CRESTOR TAKE 1 TABLET EVERY OTHER DAY ORALLY   Trulicity A999333 0000000 Sopn Generic drug: Dulaglutide INJECT 0.75 MG INTO THE SKIN ONCE A WEEK.   VITAMIN D PO Take by mouth. 1000 units once daily       Allergies:  Allergies  Allergen Reactions  . Augmentin [Amoxicillin-Pot Clavulanate]     Gi sx    Past Medical History:  Diagnosis Date  . Allergic rhinitis   . Allergy   . CAD (coronary artery disease) 4/12   WITH LAD DES   . Cataract    forming- small   . Diabetes ([address]) 2007  . Elevated cholesterol    pt denies this 11-11-15- states his cholesterol is fine   . GERD (gastroesophageal reflux disease)   . Hyperlipidemia    pt states uses Crestor due to Stent - not HLD   . Hypertension   . MI (mitral incompetence)   . Vitamin D deficiency     Past Surgical History:  Procedure Laterality Date  . COLONOSCOPY  03-27-2004   2017- #2  . CORONARY ANGIOPLASTY WITH STENT PLACEMENT  11-2010  . POLYPECTOMY    . VASECTOMY      Family History  Problem Relation Age of Onset  . Hepatitis Brother   . Diabetes Paternal Grandfather   . Heart failure Paternal Grandfather        CHF  . Colon cancer Neg Hx   . Colon  polyps  Neg Hx   . Esophageal cancer Neg Hx   . Rectal cancer Neg Hx   . Stomach cancer Neg Hx     Social History:  reports that he has never smoked. He has never used smokeless tobacco. He reports that he does not drink alcohol or use drugs.   Review of Systems   Lipid history:  Baseline cholesterol has been normal, on Crestor for history of CAD, currently only on 5 mg every other day as prescribed by PCP Followed by PCP, last LDL was 25 done in 3/20 and is due for another follow-up  Levels as follows:    Lab Results  Component Value Date   CHOL 75 08/25/2018   HDL 36.80 (L) 08/25/2018   LDLCALC 22 08/25/2018   TRIG 79.0 08/25/2018   CHOLHDL 2 08/25/2018           Hypertension: Treated with Avapro 300 mg and HCTZ qod He has had long-standing hypertension and is followed by PCP/cardiology Was seen by cardiologist also and no changes made  Most recent eye exam was 01/2019, sees Dr. Herbert Deaner  Most recent foot exam: 12/2017   LABS:  Lab on 09/25/2019  Component Date Value Ref Range Status  . Sodium 09/25/2019 137  135 - 145 mEq/L Final  . Potassium 09/25/2019 4.3  3.5 - 5.1 mEq/L Final  . Chloride 09/25/2019 103  96 - 112 mEq/L Final  . CO2 09/25/2019 28  19 - 32 mEq/L Final  . Glucose, Bld 09/25/2019 151* 70 - 99 mg/dL Final  . BUN 09/25/2019 18  6 - 23 mg/dL Final  . Creatinine, Ser 09/25/2019 1.25  0.40 - 1.50 mg/dL Final  . GFR 09/25/2019 58.20* >60.00 mL/min Final  . Calcium 09/25/2019 9.7  8.4 - 10.5 mg/dL Final  . Hgb A1c MFr Bld 09/25/2019 7.1* 4.6 - 6.5 % Final   Glycemic Control Guidelines for People with Diabetes:Non Diabetic:  <6%Goal of Therapy: <7%Additional Action Suggested:  >8%     Physical Examination:  BP 130/80 (BP Location: Left Arm, Patient Position: Sitting, Cuff Size: Normal)   Pulse 88   Ht 6\' 5"  (1.956 m)   Wt 269 lb (122 kg)   SpO2 98%   BMI 31.90 kg/m    ASSESSMENT:  Diabetes type 2, on Trulicity A999333 and Metformin  See history  of present illness for detailed discussion of current diabetes management, blood sugar patterns and problems identified  His A1c is not as well controlled, now 7.1 compared to 6.6  Blood sugars are generally higher in the morning compared to after supper This is accompanied by some weight gain He likely has had a little progression of his diabetes Also needs weight loss Ideally should be on a SGLT2 drug but he feels that it was not effective in the past when combined with Metformin  HYPERTENSION: He is getting good blood pressure control with consistent readings No lightheadedness recently with taking HCTZ every other day Also continue Avapro  PLAN:    Trial of 1.5 mg Trulicity.  He will take 2 shots together of the 0.75 mg dosage on Thursday and let us know the following week if he can tolerate this  Discussed other options such as Actos and Invokana and pros and cons for these He may do well with SGLT2 drug combined with Trulicity and leave off HCTZ, likely [place] is better covered on his insurance compared to [place]  Again encouraged him to check more readings after breakfast and lunch to see  if he needs to modify his diet He likely will be able to do more regular walking now  Needs foot exam from PCP next month   There are no Patient Instructions on file for this visit.    Elayne Snare 10/01/2019, 8:28 AM   Note: This office note was prepared with Dragon voice recognition system technology. Any transcriptional errors that result from this process are unintentional.

## 2019-10-01 ENCOUNTER — Encounter: Payer: Self-pay | Admitting: Endocrinology

## 2019-10-01 ENCOUNTER — Ambulatory Visit: Payer: Commercial Managed Care - PPO | Admitting: Endocrinology

## 2019-10-01 ENCOUNTER — Other Ambulatory Visit: Payer: Self-pay

## 2019-10-01 VITALS — BP 130/80 | HR 88 | Ht 77.0 in | Wt 269.0 lb

## 2019-10-01 DIAGNOSIS — E1165 Type 2 diabetes mellitus with hyperglycemia: Secondary | ICD-10-CM

## 2019-10-01 DIAGNOSIS — I10 Essential (primary) hypertension: Secondary | ICD-10-CM

## 2019-10-16 ENCOUNTER — Telehealth: Payer: Self-pay | Admitting: Endocrinology

## 2019-10-16 ENCOUNTER — Other Ambulatory Visit: Payer: Self-pay

## 2019-10-16 ENCOUNTER — Telehealth: Payer: Self-pay | Admitting: Interventional Cardiology

## 2019-10-16 MED ORDER — TRULICITY 0.75 MG/0.5ML ~~LOC~~ SOAJ: SUBCUTANEOUS | 2 refills | Status: DC

## 2019-10-16 NOTE — Telephone Encounter (Signed)
Ok to stop HCTZ - looks to only be taking 12.5 QOD.   Would need to monitor BP with stopping the HCTZ.    We can do a phone visit in about a month to see how he is doing.   Burtis Junes, RN, [facility] [address] [place], [place]  [address] [phone]

## 2019-10-16 NOTE — Telephone Encounter (Signed)
Rx sent for Trulicity

## 2019-10-16 NOTE — Telephone Encounter (Signed)
Patient says hes still waiting for other dr to approve to stop taking hydrochlorothiazide and start farxiga - he also was told to take more trulicity and so he is almost out of that and requests a refill for that.  Pharmacy -    [id number] - [place], [address] Phone:  [phone]  Fax:  [phone]

## 2019-10-16 NOTE — Telephone Encounter (Signed)
Pt states that his sugar has been doing good for several years and in the last couple of months, it's been running a little high and his dr wants to put him on Farxega and in order to do that, he would have to be off the HCTZ.  Please advise!

## 2019-10-16 NOTE — Telephone Encounter (Signed)
New message:    Patient calling to speak with some concering changing a medication. Please call patient.

## 2019-10-17 ENCOUNTER — Other Ambulatory Visit: Payer: Self-pay | Admitting: *Deleted

## 2019-10-17 ENCOUNTER — Telehealth: Payer: Self-pay | Admitting: *Deleted

## 2019-10-17 NOTE — Telephone Encounter (Signed)
S/w pt is aware of Lori's recommendations.  Pt will keep track of bp and give readings day of video visit, if issues with bp before visit will send mychart message. Pt will do video visit on April 12.  Medication list updated.

## 2019-10-17 NOTE — Telephone Encounter (Signed)

## 2019-10-19 NOTE — Telephone Encounter (Signed)
Patient called stating Dr Daneen Schick needs documentation / approval for him to stop taking the hydrochlorothiazide. Patient said he does not know fax# but we should have it on file? Systems developer)

## 2019-10-22 NOTE — Telephone Encounter (Signed)
I have already discussed with his nurse practitioner on staff message and no further documentation is needed

## 2019-10-22 NOTE — Telephone Encounter (Signed)
Noted  

## 2019-11-07 NOTE — Progress Notes (Deleted)
Telehealth Visit  {Choose 1 Note Type (Telehealth Visit or Telephone Visit):[phone]}   Evaluation Performed:  Follow-up visit  This visit type was conducted due to national recommendations for restrictions regarding the COVID-19 Pandemic (e.g. social distancing).  This format is felt to be most appropriate for this patient at this time.  All issues noted in this document were discussed and addressed.  No physical exam was performed (except for noted visual exam findings with Video Visits).  Please refer to the patient's chart (MyChart message for video visits and phone note for telephone visits) for the patient's consent to telehealth for [facility].  Date:  11/07/2019   ID:  Henry Rivera, DOB [birth year], MRN [id number]  Patient Location:  ***  Provider location:   ***  PCP:  Deland Pretty, MD  Cardiologist:  Servando Snare & No primary care provider on file.  Electrophysiologist:  None   Chief Complaint:  ***  History of Present Illness:    Henry Rivera is a 64 y.o. male who presents via audio/video conferencing for a telehealth visit today.  Seen for ***  The patient {does/does not:200015} have symptoms concerning for COVID-19 infection (fever, chills, cough, or new shortness of breath).   Seen today via ***. *** has consented for this visit.    Past Medical History:  Diagnosis Date  . Allergic rhinitis   . Allergy   . CAD (coronary artery disease) 4/12   WITH LAD DES   . Cataract    forming- small   . Diabetes ([place]) 2007  . Elevated cholesterol    pt denies this 11-11-15- states his cholesterol is fine   . GERD (gastroesophageal reflux disease)   . Hyperlipidemia    pt states uses Crestor due to Stent - not HLD   . Hypertension   . MI (mitral incompetence)   . Vitamin D deficiency    Past Surgical History:  Procedure Laterality Date  . COLONOSCOPY  03-27-2004   2017- #2  . CORONARY ANGIOPLASTY WITH STENT PLACEMENT  11-2010  . POLYPECTOMY    .  VASECTOMY       No outpatient medications have been marked as taking for the 11/12/19 encounter (Appointment) with Burtis Junes, NP.     Allergies:   Augmentin [amoxicillin-pot clavulanate]   Social History   Tobacco Use  . Smoking status: Never Smoker  . Smokeless tobacco: Never Used  Substance Use Topics  . Alcohol use: No    Alcohol/week: 0.0 standard drinks  . Drug use: No     Family Hx: The patient's family history includes Diabetes in his paternal grandfather; Heart failure in his paternal grandfather; Hepatitis in his brother. There is no history of Colon cancer, Colon polyps, Esophageal cancer, Rectal cancer, or Stomach cancer.  ROS:   Please see the history of present illness.   All other systems reviewed are negative except for ***.    Objective:    Vital Signs:  There were no vitals taken for this visit.   Wt Readings from Last 3 Encounters:  10/01/19 269 lb (122 kg)  04/23/19 262 lb 1.9 oz (118.9 kg)  01/30/19 261 lb (118.4 kg)    Alert male in no acute distress.   Labs/Other Tests and Data Reviewed:    Lab Results  Component Value Date   WBC 5.6 04/06/2018   HGB 14.5 04/06/2018   HCT 43 04/06/2018   PLT 201 04/06/2018   GLUCOSE 151 (H) 09/25/2019   CHOL 75 08/25/2018  TRIG 79.0 08/25/2018   HDL 36.80 (L) 08/25/2018   LDLCALC 22 08/25/2018   ALT 29 04/06/2018   AST 20 04/06/2018   NA 137 09/25/2019   K 4.3 09/25/2019   CL 103 09/25/2019   CREATININE 1.25 09/25/2019   BUN 18 09/25/2019   CO2 28 09/25/2019   HGBA1C 7.1 (H) 09/25/2019   MICROALBUR <0.7 12/26/2018     BNP (last 3 results) No results for input(s): BNP in the last 8760 hours.  ProBNP (last 3 results) No results for input(s): PROBNP in the last 8760 hours.    Prior CV studies:    The following studies were reviewed today:  ***   ASSESSMENT & PLAN:    1.    . COVID-19 Education: The signs and symptoms of COVID-19 were discussed with the patient and how to  seek care for testing (follow up with PCP or arrange E-visit).  The importance of social distancing, staying at home, hand hygiene and wearing a mask when out in public were discussed today.  Patient Risk:   After full review of this patient's clinical status, I feel that they are at least moderate risk at this time.  Time:   Today, I have spent *** minutes with the patient with telehealth technology discussing the above issues.     Medication Adjustments/Labs and Tests Ordered: Current medicines are reviewed at length with the patient today.  Concerns regarding medicines are outlined above.   Tests Ordered: No orders of the defined types were placed in this encounter.   Medication Changes: No orders of the defined types were placed in this encounter.   Disposition:  FU with *** in {gen number [id number] {Days to years:10300}.   Patient is agreeable to this plan and will call if any problems develop in the interim.   Amie Critchley, NP  11/07/2019 8:27 AM    [facility]

## 2019-11-12 ENCOUNTER — Telehealth: Payer: Commercial Managed Care - PPO | Admitting: Nurse Practitioner

## 2019-11-21 ENCOUNTER — Other Ambulatory Visit: Payer: Self-pay

## 2019-11-21 MED ORDER — FARXIGA 5 MG PO TABS: 5.0000 mg | ORAL_TABLET | Freq: Every day | ORAL | 2 refills | Status: DC

## 2019-11-21 NOTE — Telephone Encounter (Signed)
Rx for Henry Rivera was never sent. What dose does pt need to be taking?

## 2019-11-21 NOTE — Telephone Encounter (Signed)
5 mg 

## 2019-11-21 NOTE — Telephone Encounter (Signed)
Rx sent 

## 2019-12-06 ENCOUNTER — Other Ambulatory Visit: Payer: Self-pay | Admitting: Endocrinology

## 2020-01-04 ENCOUNTER — Other Ambulatory Visit: Payer: Self-pay

## 2020-01-04 ENCOUNTER — Other Ambulatory Visit (INDEPENDENT_AMBULATORY_CARE_PROVIDER_SITE_OTHER): Payer: Commercial Managed Care - PPO

## 2020-01-04 DIAGNOSIS — E1165 Type 2 diabetes mellitus with hyperglycemia: Secondary | ICD-10-CM

## 2020-01-04 LAB — BASIC METABOLIC PANEL
BUN: 18 mg/dL (ref 6–23)
CO2: 30 mEq/L (ref 19–32)
Calcium: 9.5 mg/dL (ref 8.4–10.5)
Chloride: 103 mEq/L (ref 96–112)
Creatinine, Ser: 1.31 mg/dL (ref 0.40–1.50)
GFR: 55.09 mL/min — ABNORMAL LOW (ref >60.00)
Glucose, Bld: 124 mg/dL — ABNORMAL HIGH (ref 70–99)
Potassium: 4.3 mEq/L (ref 3.5–5.1)
Sodium: 138 mEq/L (ref 135–145)

## 2020-01-04 LAB — HEMOGLOBIN A1C: Hgb A1c MFr Bld: 6.9 % — ABNORMAL HIGH (ref 4.6–6.5)

## 2020-01-09 ENCOUNTER — Ambulatory Visit: Payer: Commercial Managed Care - PPO | Admitting: Endocrinology

## 2020-01-14 ENCOUNTER — Ambulatory Visit: Payer: Commercial Managed Care - PPO | Admitting: Endocrinology

## 2020-01-14 ENCOUNTER — Other Ambulatory Visit: Payer: Self-pay

## 2020-01-14 ENCOUNTER — Encounter: Payer: Self-pay | Admitting: Endocrinology

## 2020-01-14 VITALS — BP 122/80 | HR 79 | Ht 77.0 in | Wt 269.6 lb

## 2020-01-14 DIAGNOSIS — I10 Essential (primary) hypertension: Secondary | ICD-10-CM

## 2020-01-14 DIAGNOSIS — E1142 Type 2 diabetes mellitus with diabetic polyneuropathy: Secondary | ICD-10-CM | POA: Diagnosis not present

## 2020-01-14 DIAGNOSIS — M2041 Other hammer toe(s) (acquired), right foot: Secondary | ICD-10-CM

## 2020-01-14 DIAGNOSIS — E1165 Type 2 diabetes mellitus with hyperglycemia: Secondary | ICD-10-CM | POA: Diagnosis not present

## 2020-01-14 NOTE — Patient Instructions (Signed)
[facility]

## 2020-01-14 NOTE — Progress Notes (Signed)
Patient ID: Henry Rivera, male   DOB: Dec 16, 1955, 64 y.o.   MRN: [id number]            Reason for Appointment: Follow-up for Type 2 Diabetes   History of Present Illness:          Date of diagnosis of type 2 diabetes mellitus: 2007      Background history:   His A1c was apparently 12.7 at the time of diagnosis when he had increased thirst and urination He was started on Janumet and apparently continued unchanged until last year, this was stopped because of high out-of-pocket expense He was subsequently on metformin only and then Jardiance 10 mg started His A1c in 2017 was 6.7 twice, previous records not available.  Recent history:       Non-insulin hypoglycemic drugs the patient is taking are: [place] 5mg ,  Trulicity 7.51 mg weekly, metformin 1 g twice a day   His A1c is 6.9, slightly better  Current management, blood sugar patterns and problems identified:   He has taken [place] over the last 3 months and is now back for his follow-up  He still tends to have somewhat high readings overall but most of his high readings are in the morning  Blood sugars appear to be better in the last few days however  He said that he can do a little better with his diet and sometimes will have a cookie at bedtime  However his weight gain has leveled off  His exercise has been somewhat variable depending on his schedule with work  Blood sugars are excellent after dinner  No side effects with Wilder Glade and renal function is about the same       Side effects from medications have been: Nausea from high-dose Trulicity  Compliance with the medical regimen: Usually good  Glucose monitoring:  done 1-2 times a day         Glucometer: One Touch ultra mini .      Blood Glucose data   PRE-MEAL Fasting Lunch Dinner Bedtime Overall  Glucose range: 104-154      Mean/median:  135    136   POST-MEAL PC Breakfast PC Lunch PC Dinner  Glucose range:    89-169  Mean/median:    138     PREVIOUS readings:  PRE-MEAL Fasting Lunch Dinner Bedtime Overall  Glucose range:  84-193      Mean/median:  142    141   POST-MEAL PC Breakfast PC Lunch PC Dinner  Glucose range:    111-175  Mean/median:    139    Self-care: The diet that the patient has been following is: tries to limit high-fat foods .                  Dietician visit, most recent: none, previously attended classes at cardiac rehabilitation               Exercise:  Walking 4-7 miles almost daily  Weight history: Maximum weight had been 318  Wt Readings from Last 3 Encounters:  01/14/20 269 lb 9.6 oz (122.3 kg)  10/01/19 269 lb (122 kg)  04/23/19 262 lb 1.9 oz (118.9 kg)    Glycemic control:    Lab Results  Component Value Date   HGBA1C 6.9 (H) 01/04/2020   HGBA1C 7.1 (H) 09/25/2019   HGBA1C 6.6 (H) 12/26/2018   Lab Results  Component Value Date   MICROALBUR <0.7 12/26/2018   LDLCALC 22 08/25/2018   CREATININE 1.31  01/04/2020   Lab Results  Component Value Date   MICRALBCREAT 0.5 12/26/2018     Other active problems: See review of systems   Allergies as of 01/14/2020      Reactions   Augmentin [amoxicillin-pot Clavulanate]    Gi sx      Medication List       Accurate as of January 14, 2020 10:57 AM. If you have any questions, ask your nurse or doctor.        STOP taking these medications   irbesartan 300 MG tablet Commonly known as: AVAPRO Stopped by: Elayne Snare, MD     TAKE these medications   aspirin 81 MG tablet Take 81 mg by mouth daily.   CINNAMON PO Take 1,000 capsules by mouth daily.   esomeprazole 40 MG capsule Commonly known as: NEXIUM Take 40 mg by mouth daily at 12 noon.   Farxiga 5 MG Tabs tablet Generic drug: dapagliflozin propanediol Take 5 mg by mouth daily.   fexofenadine 180 MG tablet Commonly known as: ALLEGRA Take 180 mg by mouth daily.   Flonase 50 MCG/ACT nasal spray Generic drug: fluticasone Place 2 sprays into both nostrils as needed  for allergies or rhinitis.   glucose blood test strip Use as instructed to check blood sugar 1 time daily as needed.   OneTouch Verio test strip Generic drug: glucose blood CHECK SUGAR 2 TIMES A DAY.   metFORMIN 1000 MG tablet Commonly known as: GLUCOPHAGE Take 1,000 mg by mouth 2 (two) times daily with a meal.   nitroGLYCERIN 0.4 MG SL tablet Commonly known as: NITROSTAT Place 1 tablet (0.4 mg total) under the tongue every 5 (five) minutes as needed for chest pain.   rosuvastatin 5 MG tablet Commonly known as: CRESTOR TAKE 1 TABLET EVERY OTHER DAY ORALLY   Trulicity 7.78 EU/2.3NT Sopn Generic drug: Dulaglutide INJECT 0.75MG  INTO THE SKIN ONCE A WEEK   valsartan 320 MG tablet Commonly known as: DIOVAN Take 320 mg by mouth daily.   VITAMIN D PO Take by mouth. 1000 units once daily       Allergies:  Allergies  Allergen Reactions  . Augmentin [Amoxicillin-Pot Clavulanate]     Gi sx    Past Medical History:  Diagnosis Date  . Allergic rhinitis   . Allergy   . CAD (coronary artery disease) 4/12   WITH LAD DES   . Cataract    forming- small   . Diabetes ([place]) 2007  . Elevated cholesterol    pt denies this 11-11-15- states his cholesterol is fine   . GERD (gastroesophageal reflux disease)   . Hyperlipidemia    pt states uses Crestor due to Stent - not HLD   . Hypertension   . MI (mitral incompetence)   . Vitamin D deficiency     Past Surgical History:  Procedure Laterality Date  . COLONOSCOPY  03-27-2004   2017- #2  . CORONARY ANGIOPLASTY WITH STENT PLACEMENT  11-2010  . POLYPECTOMY    . VASECTOMY      Family History  Problem Relation Age of Onset  . Hepatitis Brother   . Diabetes Paternal Grandfather   . Heart failure Paternal Grandfather        CHF  . Colon cancer Neg Hx   . Colon polyps Neg Hx   . Esophageal cancer Neg Hx   . Rectal cancer Neg Hx   . Stomach cancer Neg Hx     Social History:  reports that he has never  smoked. He has never  used smokeless tobacco. He reports that he does not drink alcohol and does not use drugs.   Review of Systems   Lipid history:  Baseline cholesterol has been normal, on Crestor for history of CAD, currently only on 5 mg every other day as prescribed by PCP Followed by PCP, last LDL was 25 done in 3/20 and is due for another follow-up  Levels as follows:    Lab Results  Component Value Date   CHOL 75 08/25/2018   HDL 36.80 (L) 08/25/2018   LDLCALC 22 08/25/2018   TRIG 79.0 08/25/2018   CHOLHDL 2 08/25/2018           Hypertension: Not clear if he is taking valsartan or irbesartan  He has had long-standing hypertension and is followed by PCP/cardiology  Most recent eye exam was 01/2019, sees Dr. Herbert Deaner  NEUROPATHY: He has mild numbness/decrease in monofilament sensation in his toes for some time  Most recent foot exam: 6/21  Asking about the right second toe being a little prominent and mild swelling of the first toe   LABS:  No visits with results within 1 Week(s) from this visit.  Latest known visit with results is:  Lab on 01/04/2020  Component Date Value Ref Range Status  . Sodium 01/04/2020 138  135 - 145 mEq/L Final  . Potassium 01/04/2020 4.3  3.5 - 5.1 mEq/L Final  . Chloride 01/04/2020 103  96 - 112 mEq/L Final  . CO2 01/04/2020 30  19 - 32 mEq/L Final  . Glucose, Bld 01/04/2020 124* 70 - 99 mg/dL Final  . BUN 01/04/2020 18  6 - 23 mg/dL Final  . Creatinine, Ser 01/04/2020 1.31  0.40 - 1.50 mg/dL Final  . GFR 01/04/2020 55.09* >60.00 mL/min Final  . Calcium 01/04/2020 9.5  8.4 - 10.5 mg/dL Final  . Hgb A1c MFr Bld 01/04/2020 6.9* 4.6 - 6.5 % Final   Glycemic Control Guidelines for People with Diabetes:Non Diabetic:  <6%Goal of Therapy: <7%Additional Action Suggested:  >8%     Physical Examination:  BP 122/80 (BP Location: Left Arm, Patient Position: Sitting, Cuff Size: Normal)   Pulse 79   Ht 6\' 5"  (1.956 m)   Wt 269 lb 9.6 oz (122.3 kg)   SpO2 98%    BMI 31.97 kg/m   Diabetic Foot Exam - Simple   Simple Foot Form Diabetic Foot exam was performed with the following findings: Yes   Visual Inspection No deformities, no ulcerations, no other skin breakdown bilaterally: Yes See comments: Yes Sensation Testing See comments: Yes Pulse Check Posterior Tibialis and Dorsalis pulse intact bilaterally: Yes Comments Heberden's nodes on the right second distal toe.  Small healed abrasion on the dorsal area without warmth, minimal redness. Decreased monofilament sensation in the toes distally     ASSESSMENT:  Diabetes type 2, on Trulicity 5.63 and Metformin  See history of present illness for detailed discussion of current diabetes management, blood sugar patterns and problems identified  His A1c is slightly better at 6.9  Blood sugars are as before higher in the morning compared to after supper However they are more stable and recently improved Weight gain has leveled off   HYPERTENSION: Blood pressure still well controlled and with stopping HCTZ and using [place] his blood pressure is stable He is not clear whether he is taking valsartan or Avapro  Creatinine is minimally higher  Deformity of the right second toe is likely from osteoarthritis, he has not seen  a podiatrist before  PLAN:    No change in diabetes medicine regimen To call if blood sugars are starting to get higher and consider increasing Wilder Glade He will try to have a lower fat snack at bedtime instead of cookies  He will call to confirm his medications for blood pressure  Needs podiatrist referral and likely needs diabetic shoes  There are no Patient Instructions on file for this visit.    Elayne Snare 01/14/2020, 10:57 AM   Note: This office note was prepared with Dragon voice recognition system technology. Any transcriptional errors that result from this process are unintentional.

## 2020-01-31 ENCOUNTER — Ambulatory Visit: Payer: Commercial Managed Care - PPO | Admitting: Podiatry

## 2020-01-31 ENCOUNTER — Ambulatory Visit (INDEPENDENT_AMBULATORY_CARE_PROVIDER_SITE_OTHER): Payer: Commercial Managed Care - PPO

## 2020-01-31 ENCOUNTER — Encounter: Payer: Self-pay | Admitting: Podiatry

## 2020-01-31 ENCOUNTER — Other Ambulatory Visit: Payer: Self-pay

## 2020-01-31 VITALS — Temp 97.8°F

## 2020-01-31 DIAGNOSIS — M2042 Other hammer toe(s) (acquired), left foot: Secondary | ICD-10-CM | POA: Diagnosis not present

## 2020-01-31 DIAGNOSIS — M205X1 Other deformities of toe(s) (acquired), right foot: Secondary | ICD-10-CM | POA: Diagnosis not present

## 2020-01-31 DIAGNOSIS — M2041 Other hammer toe(s) (acquired), right foot: Secondary | ICD-10-CM | POA: Diagnosis not present

## 2020-01-31 NOTE — Patient Instructions (Signed)
Hammer Toe  Hammer toe is a change in the shape (a deformity) of your toe. The deformity causes the middle joint of your toe to stay bent. This causes pain, especially when you are wearing shoes. Hammer toe starts gradually. At first, the toe can be straightened. Gradually over time, the deformity becomes stiff and permanent. Early treatments to keep the toe straight may relieve pain. As the deformity becomes stiff and permanent, surgery may be needed to straighten the toe. What are the causes? Hammer toe is caused by abnormal bending of the toe joint that is closest to your foot. It happens gradually over time. This pulls on the muscles and connections (tendons) of the toe joint, making them weak and stiff. It is often related to wearing shoes that are too short or narrow and do not let your toes straighten. What increases the risk? You may be at greater risk for hammer toe if you:  Are male.  Are older.  Wear shoes that are too small.  Wear high-heeled shoes that pinch your toes.  Are a ballet dancer.  Have a second toe that is longer than your big toe (first toe).  Injure your foot or toe.  Have arthritis.  Have a family history of hammer toe.  Have a nerve or muscle disorder. What are the signs or symptoms? The main symptoms of this condition are pain and deformity of the toe. The pain is worse when wearing shoes, walking, or running. Other symptoms may include:  Corns or calluses over the bent part of the toe or between the toes.  Redness and a burning feeling on the toe.  An open sore that forms on the top of the toe.  Not being able to straighten the toe. How is this diagnosed? This condition is diagnosed based on your symptoms and a physical exam. During the exam, your health care provider will try to straighten your toe to see how stiff the deformity is. You may also have tests, such as:  A blood test to check for rheumatoid arthritis.  An X-ray to show how  severe the deformity is. How is this treated? Treatment for this condition will depend on how stiff the deformity is. Surgery is often needed. However, sometimes a hammer toe can be straightened without surgery. Treatments that do not involve surgery include:  Taping the toe into a straightened position.  Using pads and cushions to protect the toe (orthotics).  Wearing shoes that provide enough room for the toes.  Doing toe-stretching exercises at home.  Taking an NSAID to reduce pain and swelling. If these treatments do not help or the toe cannot be straightened, surgery is the next option. The most common surgeries used to straighten a hammer toe include:  Arthroplasty. In this procedure, part of the joint is removed, and that allows the toe to straighten.  Fusion. In this procedure, cartilage between the two bones of the joint is taken out and the bones are fused together into one longer bone.  Implantation. In this procedure, part of the bone is removed and replaced with an implant to let the toe move again.  Flexor tendon transfer. In this procedure, the tendons that curl the toes down (flexor tendons) are repositioned. Follow these instructions at home:  Take over-the-counter and prescription medicines only as told by your health care provider.  Do toe straightening and stretching exercises as told by your health care provider.  Keep all follow-up visits as told by your health care   provider. This is important. How is this prevented?  Wear shoes that give your toes enough room and do not cause pain.  Do not wear high-heeled shoes. Contact a health care provider if:  Your pain gets worse.  Your toe becomes red or swollen.  You develop an open sore on your toe. This information is not intended to replace advice given to you by your health care provider. Make sure you discuss any questions you have with your health care provider. Document Revised: 07/01/2017 Document  Reviewed: 11/12/2015 Elsevier Patient Education  2020 [employer] Inc[employer]  

## 2020-01-31 NOTE — Progress Notes (Signed)
   Subjective:    Patient ID: Henry Rivera, male    DOB: 05-Jul-1956, 64 y.o.   MRN: [id number]  HPI    Review of Systems  All other systems reviewed and are negative.      Objective:   Physical Exam        Assessment & Plan:

## 2020-02-01 NOTE — Progress Notes (Signed)
Subjective:   Patient ID: Henry Rivera, male   DOB: 64 y.o.   MRN: [id number]   HPI Patient presents stating he has developed a lot of irritation on top of his second toe of his right foot and its been bothering him for at least a month and the toe seem to move spontaneously.  States that it is very tender and makes it hard for him to wear shoe gear comfortably.  Patient also has had some swelling on the top of the feet had a circulation test which was normal and patient does have diabetes under reasonably good control does not smoke and likes to be active   Review of Systems  All other systems reviewed and are negative.       Objective:  Physical Exam Vitals and nursing note reviewed.  Constitutional:      Appearance: He is well-developed.  Pulmonary:     Effort: Pulmonary effort is normal.  Musculoskeletal:        General: Normal range of motion.  Skin:    General: Skin is warm.  Neurological:     Mental Status: He is alert.     Neurovascular status was found to be intact muscle strength was found to be adequate range of motion within normal limits subtalar midtarsal joint.  I did note that there is significant dorsal elevation of the second digit right which has occurred recently and there is a red braised area on top of the toe at the proximal to phalangeal joint with redness around this area but no drainage no proximal edema there edema drainage noted.  Also noted there to be significant loss of motion of the big toe joint right with what appears to be dorsal spurring but not painful.  Good digital perfusion noted well oriented      Assessment:  Probability for a flexor plate disruption second digit causing elevation of the toe with rigid contracture of the digit now with dorsal abrasion of tissue and pain along with hallux limitus nonsymptomatic     Plan:  H&P x-ray reviewed education rendered.  Due to the fact this is becoming a brace and painful I do think digital  fusions could be necessary and I educated him on this.  I do recommend leaving the big toe joint alone as it is nonpainful and patient is retiring in August and plans are that he wants to get it done after that.  I do think that he can be careful with this and wear wider shoes and hopefully not get an infection prior to [place] being able to correct this and I did dispense padding to try to take pressure off the toe and advised him on medications daily inspections and to call us immediately if any further redness drainage or pathology were to occur  X-rays indicate there is significant elevation of the second digit right foot with significant hallux limitus destruction of the joint first MPJ right nonsymptomatic

## 2020-02-07 ENCOUNTER — Ambulatory Visit: Payer: Commercial Managed Care - PPO | Admitting: Podiatry

## 2020-02-15 ENCOUNTER — Other Ambulatory Visit: Payer: Self-pay | Admitting: Endocrinology

## 2020-02-17 ENCOUNTER — Other Ambulatory Visit: Payer: Self-pay | Admitting: Endocrinology

## 2020-04-10 ENCOUNTER — Other Ambulatory Visit (INDEPENDENT_AMBULATORY_CARE_PROVIDER_SITE_OTHER): Payer: Commercial Managed Care - PPO

## 2020-04-10 ENCOUNTER — Other Ambulatory Visit: Payer: Self-pay

## 2020-04-10 DIAGNOSIS — E1165 Type 2 diabetes mellitus with hyperglycemia: Secondary | ICD-10-CM

## 2020-04-10 LAB — BASIC METABOLIC PANEL
BUN: 17 mg/dL (ref 6–23)
CO2: 28 mEq/L (ref 19–32)
Calcium: 9.6 mg/dL (ref 8.4–10.5)
Chloride: 102 mEq/L (ref 96–112)
Creatinine, Ser: 1.3 mg/dL (ref 0.40–1.50)
GFR: 55.53 mL/min — ABNORMAL LOW (ref >60.00)
Glucose, Bld: 147 mg/dL — ABNORMAL HIGH (ref 70–99)
Potassium: 4.2 mEq/L (ref 3.5–5.1)
Sodium: 137 mEq/L (ref 135–145)

## 2020-04-10 LAB — HEMOGLOBIN A1C: Hgb A1c MFr Bld: 7 % — ABNORMAL HIGH (ref 4.6–6.5)

## 2020-04-11 ENCOUNTER — Other Ambulatory Visit: Payer: Commercial Managed Care - PPO

## 2020-04-17 ENCOUNTER — Other Ambulatory Visit: Payer: Self-pay

## 2020-04-17 ENCOUNTER — Ambulatory Visit: Payer: Commercial Managed Care - PPO | Admitting: Endocrinology

## 2020-04-17 ENCOUNTER — Encounter: Payer: Self-pay | Admitting: Endocrinology

## 2020-04-17 VITALS — BP 130/82 | HR 86 | Ht 77.0 in | Wt 266.8 lb

## 2020-04-17 DIAGNOSIS — I10 Essential (primary) hypertension: Secondary | ICD-10-CM | POA: Diagnosis not present

## 2020-04-17 DIAGNOSIS — E1165 Type 2 diabetes mellitus with hyperglycemia: Secondary | ICD-10-CM

## 2020-04-17 NOTE — Patient Instructions (Addendum)
Take 2 pills of 5mg  Farxiga  in am Need to send 10mg  Rx  Check blood sugars on waking up 3 days a week  Also check blood sugars about 2 hours after meals and do this after different meals by rotation  Recommended blood sugar levels on waking up are 90-130 and about 2 hours after meal is 130-160  Please bring your blood sugar monitor to each visit, thank you

## 2020-04-17 NOTE — Progress Notes (Signed)
Patient ID: Henry Rivera, male   DOB: Jan 11, 1956, 64 y.o.   MRN: [id number]            Reason for Appointment: Follow-up for Type 2 Diabetes   History of Present Illness:          Date of diagnosis of type 2 diabetes mellitus: 2007      Background history:   His A1c was apparently 12.7 at the time of diagnosis when he had increased thirst and urination He was started on Janumet and apparently continued unchanged until last year, this was stopped because of high out-of-pocket expense He was subsequently on metformin only and then Jardiance 10 mg started His A1c in 2017 was 6.7 twice, previous records not available.  Recent history:       Non-insulin hypoglycemic drugs the patient is taking are: [place] 5mg ,  Trulicity 0.30 mg weekly, metformin 1 g twice a day   His A1c is 7 compared to 6.9, slightly higher  Current management, blood sugar patterns and problems identified:   He has not been able to do as much walking because of foot problems and also an episode of bronchitis  Normally his walks up to 5 miles a day and now only a couple of miles at times  Recently fasting readings appear to be relatively higher  Generally does not have high readings after dinner but may not check them consistently  He does find that certain carbohydrates like potatoes will make his blood sugar go up  Weight is down 3 pounds  No side effects with Wilder Glade and renal function is about the same       Side effects from medications have been: Nausea from high-dose Trulicity  Compliance with the medical regimen: Usually good  Glucose monitoring:  done 1-2 times a day         Glucometer: One Touch ultra mini .      Blood Glucose data   PRE-MEAL Fasting Lunch Dinner Bedtime Overall  Glucose range:  123-158      Mean/median:  138    147   POST-MEAL PC Breakfast PC Lunch PC Dinner  Glucose range:   137  153-185  Mean/median:    165   Previous data:  PRE-MEAL Fasting Lunch Dinner  Bedtime Overall  Glucose range: 104-154      Mean/median:  135    136   POST-MEAL PC Breakfast PC Lunch PC Dinner  Glucose range:    89-169  Mean/median:    138     Self-care: The diet that the patient has been following is: tries to limit high-fat foods .                  Dietician visit, most recent: none, previously attended classes at cardiac rehabilitation               Weight history: Maximum weight had been 318  Wt Readings from Last 3 Encounters:  04/17/20 266 lb 12.8 oz (121 kg)  01/14/20 269 lb 9.6 oz (122.3 kg)  10/01/19 269 lb (122 kg)    Glycemic control:    Lab Results  Component Value Date   HGBA1C 7.0 (H) 04/10/2020   HGBA1C 6.9 (H) 01/04/2020   HGBA1C 7.1 (H) 09/25/2019   Lab Results  Component Value Date   MICROALBUR <0.7 12/26/2018   [place] 22 08/25/2018   CREATININE 1.30 04/10/2020   Lab Results  Component Value Date   MICRALBCREAT 0.5 12/26/2018  Other active problems: See review of systems   Allergies as of 04/17/2020      Reactions   Augmentin [amoxicillin-pot Clavulanate]    Gi sx      Medication List       Accurate as of April 17, 2020  8:48 AM. If you have any questions, ask your nurse or doctor.        aspirin 81 MG tablet Take 81 mg by mouth daily.   CINNAMON PO Take 1,000 capsules by mouth daily.   esomeprazole 40 MG capsule Commonly known as: NEXIUM Take 40 mg by mouth daily at 12 noon.   Farxiga 5 MG Tabs tablet Generic drug: dapagliflozin propanediol TAKE 5 MG BY MOUTH DAILY.   fexofenadine 180 MG tablet Commonly known as: ALLEGRA Take 180 mg by mouth daily.   Flonase 50 MCG/ACT nasal spray Generic drug: fluticasone Place 2 sprays into both nostrils as needed for allergies or rhinitis.   glucose blood test strip Use as instructed to check blood sugar 1 time daily as needed.   OneTouch Verio test strip Generic drug: glucose blood CHECK SUGAR 2 TIMES A DAY.   metFORMIN 1000 MG  tablet Commonly known as: GLUCOPHAGE Take 1,000 mg by mouth 2 (two) times daily with a meal.   methocarbamol 500 MG tablet Commonly known as: ROBAXIN Take 500 mg by mouth 3 (three) times daily.   nitroGLYCERIN 0.4 MG SL tablet Commonly known as: NITROSTAT Place 1 tablet (0.4 mg total) under the tongue every 5 (five) minutes as needed for chest pain.   rosuvastatin 5 MG tablet Commonly known as: CRESTOR TAKE 1 TABLET EVERY OTHER DAY ORALLY   Trulicity 6.07 PX/1.0GY Sopn Generic drug: Dulaglutide INJECT 0.75 MG INTO THE SKIN ONCE A WEEK.   valsartan 320 MG tablet Commonly known as: DIOVAN Take 320 mg by mouth daily.   VITAMIN D PO Take by mouth. 1000 units once daily       Allergies:  Allergies  Allergen Reactions  . Augmentin [Amoxicillin-Pot Clavulanate]     Gi sx    Past Medical History:  Diagnosis Date  . Allergic rhinitis   . Allergy   . CAD (coronary artery disease) 4/12   WITH LAD DES   . Cataract    forming- small   . Diabetes ([place]) 2007  . Elevated cholesterol    pt denies this 11-11-15- states his cholesterol is fine   . GERD (gastroesophageal reflux disease)   . Hyperlipidemia    pt states uses Crestor due to Stent - not HLD   . Hypertension   . MI (mitral incompetence)   . Vitamin D deficiency     Past Surgical History:  Procedure Laterality Date  . COLONOSCOPY  03-27-2004   2017- #2  . CORONARY ANGIOPLASTY WITH STENT PLACEMENT  11-2010  . POLYPECTOMY    . VASECTOMY      Family History  Problem Relation Age of Onset  . Hepatitis Brother   . Diabetes Paternal Grandfather   . Heart failure Paternal Grandfather        CHF  . Colon cancer Neg Hx   . Colon polyps Neg Hx   . Esophageal cancer Neg Hx   . Rectal cancer Neg Hx   . Stomach cancer Neg Hx     Social History:  reports that he has never smoked. He has never used smokeless tobacco. He reports that he does not drink alcohol and does not use drugs.   Review of Systems  Lipid  history:  Baseline cholesterol has been normal, on Crestor for history of CAD, currently only on 5 mg every other day as prescribed by PCP Followed by PCP, last LDL was 25 done in 3/20 and is due for another follow-up  Levels as follows:    Lab Results  Component Value Date   CHOL 75 08/25/2018   HDL 36.80 (L) 08/25/2018   LDLCALC 22 08/25/2018   TRIG 79.0 08/25/2018   CHOLHDL 2 08/25/2018           Hypertension: Not clear if he is taking valsartan or irbesartan  He has had long-standing hypertension and is followed by PCP/cardiology  Most recent eye exam was 01/2019, sees Dr. Herbert Deaner  NEUROPATHY: He has mild numbness/decrease in monofilament sensation in his toes for some time  Most recent foot exam: 6/21   LABS:  No visits with results within 1 Week(s) from this visit.  Latest known visit with results is:  Lab on 04/10/2020  Component Date Value Ref Range Status  . Sodium 04/10/2020 137  135 - 145 mEq/L Final  . Potassium 04/10/2020 4.2  3.5 - 5.1 mEq/L Final  . Chloride 04/10/2020 102  96 - 112 mEq/L Final  . CO2 04/10/2020 28  19 - 32 mEq/L Final  . Glucose, Bld 04/10/2020 147* 70 - 99 mg/dL Final  . BUN 04/10/2020 17  6 - 23 mg/dL Final  . Creatinine, Ser 04/10/2020 1.30  0.40 - 1.50 mg/dL Final  . GFR 04/10/2020 55.53* >60.00 mL/min Final  . Calcium 04/10/2020 9.6  8.4 - 10.5 mg/dL Final  . Hgb A1c MFr Bld 04/10/2020 7.0* 4.6 - 6.5 % Final   Glycemic Control Guidelines for People with Diabetes:Non Diabetic:  <6%Goal of Therapy: <7%Additional Action Suggested:  >8%     Physical Examination:  BP 130/82 (BP Location: Left Arm, Patient Position: Sitting, Cuff Size: Normal)   Pulse 86   Ht 6\' 5"  (1.956 m)   Wt 266 lb 12.8 oz (121 kg)   SpO2 98%   BMI 31.64 kg/m    ASSESSMENT:  Diabetes type 2, BMI over 30  Current regimen Trulicity 0.10, Farxiga 5 mg and Metformin  See history of present illness for detailed discussion of current diabetes management,  blood sugar patterns and problems identified  His A1c is 7%  Although overall blood sugars are not significantly high they are averaging 138 in the morning but not consistently higher after meals when checked Recently has not had as much exercise and also did have a course of steroids in July He is aware of what factors in the diet make his blood sugar go up   HYPERTENSION: Blood pressure well controlled    Creatinine is stable with current regimen of Farxiga and also ARB drug  PLAN:    Farxiga 10 mg daily This will likely improve blood sugar control consistently and also provide more appropriate cardiovascular risk reduction, some more weight loss and this was discussed In the meantime he will need to monitor blood pressure regularly He can have labs checked for renal function in the next 2 or 3 weeks when he is seeing his other physicians He will try to increase his walking when he can Continue to check some readings after meals Follow-up in 3 months unless having higher sugars  There are no Patient Instructions on file for this visit.    Elayne Snare 04/17/2020, 8:48 AM   Note: This office note was prepared with Dragon voice recognition system  technology. Any transcriptional errors that result from this process are unintentional.

## 2020-04-23 ENCOUNTER — Ambulatory Visit: Payer: Commercial Managed Care - PPO | Admitting: Podiatry

## 2020-04-28 ENCOUNTER — Ambulatory Visit (INDEPENDENT_AMBULATORY_CARE_PROVIDER_SITE_OTHER): Payer: Commercial Managed Care - PPO | Admitting: Podiatry

## 2020-04-28 ENCOUNTER — Encounter: Payer: Self-pay | Admitting: Podiatry

## 2020-04-28 ENCOUNTER — Other Ambulatory Visit: Payer: Self-pay

## 2020-04-28 ENCOUNTER — Ambulatory Visit (INDEPENDENT_AMBULATORY_CARE_PROVIDER_SITE_OTHER): Payer: Commercial Managed Care - PPO

## 2020-04-28 DIAGNOSIS — M2041 Other hammer toe(s) (acquired), right foot: Secondary | ICD-10-CM

## 2020-04-28 DIAGNOSIS — M205X1 Other deformities of toe(s) (acquired), right foot: Secondary | ICD-10-CM | POA: Diagnosis not present

## 2020-04-28 NOTE — Progress Notes (Signed)
Subjective:   Patient ID: Henry Rivera, male   DOB: 64 y.o.   MRN: [id number]   HPI Patient presents stating that he needs to get this toe faxed that it has lifted further and it got irritated and is worried about infection.  Also states he is noted mild discomfort in his big toe joint and wants checked    ROS      Objective:  Physical Exam  Neurovascular status intact with rigid contracture digit to right with dorsal irritation secondary to pressure from shoe gear with significant loss of motion big toe joint right but only mild pain     Assessment:  Severe flexor plate disruption with elevated second digit right along with severe hallux rigidus deformity right     Plan:  H&P reviewed x-rays again and at this point I recommended digital fusion of digit to right along with lowering the toe at the MPJ.  I spent a great deal time going over the consent form and explaining everything is outlined and the fact there is no guarantee the toe will not lift some but it should be in a better position and that hopefully we can prevent future infection.  Patient at this point is satisfied signed consent form is given all preoperative instructions scheduled for outpatient surgery.  Knows he may need surgery on his big toe joint bone and a hold off at this time  X-rays indicate severe elevation digit to right which is moved moderately further from the last time we saw 3 months ago

## 2020-04-28 NOTE — Progress Notes (Signed)
Cardiology Office Note:    Date:  05/02/2020   ID:  Henry Rivera, DOB [birth year], MRN [id number]  PCP:  Deland Pretty, MD  Cardiologist:  Sinclair Grooms, MD   Referring MD: Deland Pretty, MD   Chief Complaint  Patient presents with  . Coronary Artery Disease  . Hypertension  . Hyperlipidemia    History of Present Illness:    Henry Rivera is a 64 y.o. male with a hx of CAD with LAD DES, DM II with hemoglobin A1c typically less than 6.5, hyperlipidemia with LDL 23 03/2017, and essential hypertension.  He is now semiretired from discharge.  He described his recent activities and sounds like he is working harder now than before.  He is not exercising because of hammertoe on his left foot.  This will be operated on later this.  He denies angina, dyspnea, claudication, orthopnea, PND, palpitations, or medication side effects.  Past Medical History:  Diagnosis Date  . Allergic rhinitis   . Allergy   . CAD (coronary artery disease) 4/12   WITH LAD DES   . Cataract    forming- small   . Diabetes ([place]) 2007  . Elevated cholesterol    pt denies this 11-11-15- states his cholesterol is fine   . GERD (gastroesophageal reflux disease)   . Hyperlipidemia    pt states uses Crestor due to Stent - not HLD   . Hypertension   . MI (mitral incompetence)   . Vitamin D deficiency     Past Surgical History:  Procedure Laterality Date  . COLONOSCOPY  03-27-2004   2017- #2  . CORONARY ANGIOPLASTY WITH STENT PLACEMENT  11-2010  . POLYPECTOMY    . VASECTOMY      Current Medications: Current Meds  Medication Sig  . aspirin 81 MG tablet Take 81 mg by mouth daily.  . Cholecalciferol (VITAMIN D PO) Take by mouth. 1000 units once daily  . CINNAMON PO Take 1,000 capsules by mouth daily.  . dapagliflozin propanediol (FARXIGA) 10 MG TABS tablet Take 10 mg by mouth daily.  . Dulaglutide (TRULICITY) 7.82 NF/6.2ZH SOPN INJECT 0.75 MG INTO THE SKIN ONCE A WEEK.  Marland Kitchen esomeprazole  (NEXIUM) 40 MG capsule Take 40 mg by mouth daily at 12 noon.  . fexofenadine (ALLEGRA) 180 MG tablet Take 180 mg by mouth daily.  . fluticasone (FLONASE) 50 MCG/ACT nasal spray Place 2 sprays into both nostrils as needed for allergies or rhinitis.  Marland Kitchen glucose blood test strip Use as instructed to check blood sugar 1 time daily as needed.  . metFORMIN (GLUCOPHAGE) 1000 MG tablet Take 1,000 mg by mouth 2 (two) times daily with a meal.  . nitroGLYCERIN (NITROSTAT) 0.4 MG SL tablet Place 1 tablet (0.4 mg total) under the tongue every 5 (five) minutes as needed for chest pain.  Glory Rosebush VERIO test strip CHECK SUGAR 2 TIMES A DAY.  . rosuvastatin (CRESTOR) 5 MG tablet TAKE 1 TABLET EVERY OTHER DAY ORALLY  . valsartan (DIOVAN) 320 MG tablet Take 320 mg by mouth daily.     Allergies:   Augmentin [amoxicillin-pot clavulanate]   Social History   Socioeconomic History  . Marital status: Married    Spouse name: Not on file  . Number of children: Not on file  . Years of education: Not on file  . Highest education level: Not on file  Occupational History  . Not on file  Tobacco Use  . Smoking status: Never Smoker  . Smokeless  tobacco: Never Used  Vaping Use  . Vaping Use: Never used  Substance and Sexual Activity  . Alcohol use: No    Alcohol/week: 0.0 standard drinks  . Drug use: No  . Sexual activity: Not on file  Other Topics Concern  . Not on file  Social History Narrative  . Not on file   Social Determinants of Health   Financial Resource Strain:   . Difficulty of Paying Living Expenses: Not on file  Food Insecurity:   . Worried About Charity fundraiser in the Last Year: Not on file  . Ran Out of Food in the Last Year: Not on file  Transportation Needs:   . Lack of Transportation (Medical): Not on file  . Lack of Transportation (Non-Medical): Not on file  Physical Activity:   . Days of Exercise per Week: Not on file  . Minutes of Exercise per Session: Not on file  Stress:    . Feeling of Stress : Not on file  Social Connections:   . Frequency of Communication with Friends and Family: Not on file  . Frequency of Social Gatherings with Friends and Family: Not on file  . Attends Religious Services: Not on file  . Active Member of Clubs or Organizations: Not on file  . Attends Archivist Meetings: Not on file  . Marital Status: Not on file     Family History: The patient's family history includes Diabetes in his paternal grandfather; Heart failure in his paternal grandfather; Hepatitis in his brother. There is no history of Colon cancer, Colon polyps, Esophageal cancer, Rectal cancer, or Stomach cancer.  ROS:   Please see the history of present illness.    Hammertoe left foot.  Physical inactivity.  Recent course of steroids for inflammation in his toe.  All other systems reviewed and are negative.  EKGs/Labs/Other Studies Reviewed:    The following studies were reviewed today: No new or recent cardiac imaging  EKG:  EKG normal sinus rhythm with left axis deviation and poor R wave progression V1 through V4.  No change compared to prior tracings.  Recent Labs: 04/10/2020: BUN 17; Creatinine, Ser 1.30; Potassium 4.2; Sodium 137  Recent Lipid Panel    Component Value Date/Time   CHOL 75 08/25/2018 0757   TRIG 79.0 08/25/2018 0757   HDL 36.80 (L) 08/25/2018 0757   CHOLHDL 2 08/25/2018 0757   VLDL 15.8 08/25/2018 0757   LDLCALC 22 08/25/2018 0757    Physical Exam:    VS:  BP 130/72   Pulse 84   Ht 6\' 5"  (1.956 m)   Wt 265 lb (120.2 kg)   SpO2 97%   BMI 31.42 kg/m     Wt Readings from Last 3 Encounters:  05/02/20 265 lb (120.2 kg)  04/17/20 266 lb 12.8 oz (121 kg)  01/14/20 269 lb 9.6 oz (122.3 kg)     GEN: Tall, mildly obese, abdominal.. No acute distress HEENT: Normal NECK: No JVD. LYMPHATICS: No lymphadenopathy CARDIAC:  RRR without murmur, gallop, or edema. VASCULAR:  Normal Pulses. No bruits. RESPIRATORY:  Clear to  auscultation without rales, wheezing or rhonchi  ABDOMEN: Soft, non-tender, non-distended, No pulsatile mass, MUSCULOSKELETAL: No deformity  SKIN: Warm and dry NEUROLOGIC:  Alert and oriented x 3 PSYCHIATRIC:  Normal affect   ASSESSMENT:    1. Coronary artery disease involving native coronary artery of native heart without angina pectoris   2. Essential hypertension   3. Other hyperlipidemia   4. Educated about  COVID-19 virus infection    PLAN:    In order of problems listed above:  1. Secondary prevention reviewed. 2. Excellent blood pressure control on current regimen which includes Diovan 320 mg/day which should be continued. 3. Most recent LDL was 32 in March 2020 Crestor 5 mg every other day.  I would point out that statins provide pleotropic protection against cardiac events.  Consider increasing back to daily. 4. He has been vaccinated and looking forward to the booster.  He did not have COVID-19 disease.  Practicing social mitigation.  Overall education and awareness concerning primary/secondary risk prevention was discussed in detail: LDL less than 70, hemoglobin A1c less than 7, blood pressure target less than 130/80 mmHg, >150 minutes of moderate aerobic activity per week, avoidance of smoking, weight control (via diet and exercise), and continued surveillance/management of/for obstructive sleep apnea.    Medication Adjustments/Labs and Tests Ordered: Current medicines are reviewed at length with the patient today.  Concerns regarding medicines are outlined above.  Orders Placed This Encounter  Procedures  . EKG 12-Lead   No orders of the defined types were placed in this encounter.   Patient Instructions  Medication Instructions:  Your physician recommends that you continue on your current medications as directed. Please refer to the Current Medication list given to you today.  *If you need a refill on your cardiac medications before your next appointment, please  call your pharmacy*   Lab Work: None If you have labs (blood work) drawn today and your tests are completely normal, you will receive your results only by: Marland Kitchen MyChart Message (if you have MyChart) OR . A paper copy in the mail If you have any lab test that is abnormal or we need to change your treatment, we will call you to review the results.   Testing/Procedures: None   Follow-Up: At [facility], you and your health needs are our priority.  As part of our continuing mission to provide you with exceptional heart care, we have created designated Provider Care Teams.  These Care Teams include your primary Cardiologist (physician) and Advanced Practice Providers (APPs -  Physician Assistants and Nurse Practitioners) who all work together to provide you with the care you need, when you need it.  We recommend signing up for the patient portal called "MyChart".  Sign up information is provided on this After Visit Summary.  MyChart is used to connect with patients for Virtual Visits (Telemedicine).  Patients are able to view lab/test results, encounter notes, upcoming appointments, etc.  Non-urgent messages can be sent to your provider as well.   To learn more about what you can do with MyChart, go to [email or web address].    Your next appointment:   12 month(s)  The format for your next appointment:   In Person  Provider:   You may see Sinclair Grooms, MD or one of the following Advanced Practice Providers on your designated Care Team:    Truitt Merle, NP  Cecilie Kicks, NP  Kathyrn Drown, NP    Other Instructions      Signed, Sinclair Grooms, MD  05/02/2020 8:57 AM    [place]

## 2020-04-29 ENCOUNTER — Telehealth: Payer: Self-pay

## 2020-04-29 NOTE — Telephone Encounter (Signed)
DOS 05/13/2020  HAMMERTOE REPAIR 2ND RT - 67227  [facility] EFFECTIVE DATE - 09/03/2019  PLAN DEDUCTIBLE - $950.00 W/ $950.00 REMAINING OUT OF POCKET - $3800.00 W/ $3800.00 REMAINING COPAY $0.00 COINSURANCE - 80%  SPOKE TO ELLA AT [facility], SHE STATED NO PRECERT REQUIRED FOR COT 73750. CALL REF # [id number]

## 2020-05-02 ENCOUNTER — Ambulatory Visit (INDEPENDENT_AMBULATORY_CARE_PROVIDER_SITE_OTHER): Payer: Commercial Managed Care - PPO | Admitting: Interventional Cardiology

## 2020-05-02 ENCOUNTER — Other Ambulatory Visit: Payer: Self-pay

## 2020-05-02 ENCOUNTER — Encounter: Payer: Self-pay | Admitting: Interventional Cardiology

## 2020-05-02 VITALS — BP 130/72 | HR 84 | Ht 77.0 in | Wt 265.0 lb

## 2020-05-02 DIAGNOSIS — I10 Essential (primary) hypertension: Secondary | ICD-10-CM | POA: Diagnosis not present

## 2020-05-02 DIAGNOSIS — E7849 Other hyperlipidemia: Secondary | ICD-10-CM | POA: Diagnosis not present

## 2020-05-02 DIAGNOSIS — I2510 Atherosclerotic heart disease of native coronary artery without angina pectoris: Secondary | ICD-10-CM | POA: Diagnosis not present

## 2020-05-02 DIAGNOSIS — Z7189 Other specified counseling: Secondary | ICD-10-CM

## 2020-05-02 NOTE — Patient Instructions (Signed)

## 2020-05-05 ENCOUNTER — Telehealth: Payer: Self-pay | Admitting: Endocrinology

## 2020-05-05 NOTE — Telephone Encounter (Signed)
Medication Refill Request   Did you call your pharmacy and request this refill first?yes   If patient has not contacted pharmacy first, instruct them to do so for future refills.   Remind them that contacting the pharmacy for their refill is the quickest method to get the refill.   Refill policy also stated that it will take anywhere between 24-72 hours to receive the refill.    Name of medication? Farxiga 10 MG (new dosage)  Is this a 90 day supply? Unknown  Name and location of [facility]? [facility] [address]

## 2020-05-06 ENCOUNTER — Other Ambulatory Visit: Payer: Self-pay | Admitting: *Deleted

## 2020-05-06 MED ORDER — DAPAGLIFLOZIN PROPANEDIOL 10 MG PO TABS
10.0000 mg | ORAL_TABLET | Freq: Every day | ORAL | 3 refills | Status: DC
Start: 2020-05-06 — End: 2020-08-25

## 2020-05-06 NOTE — Telephone Encounter (Signed)
Rx Wilder Glade is not prescribe by Dr. Dwyane Dee. Please advise

## 2020-05-06 NOTE — Telephone Encounter (Signed)
Refilled Rx faxiga 10 mg sent to [facility].

## 2020-05-06 NOTE — Telephone Encounter (Signed)
If you look at my last note Farxiga 10 mg daily was prescribed here, please refill

## 2020-05-12 ENCOUNTER — Telehealth: Payer: Self-pay | Admitting: Podiatry

## 2020-05-12 MED ORDER — HYDROCODONE-ACETAMINOPHEN 10-325 MG PO TABS
1.0000 | ORAL_TABLET | Freq: Three times a day (TID) | ORAL | 0 refills | Status: AC | PRN
Start: 2020-05-12 — End: 2020-05-17

## 2020-05-12 NOTE — Telephone Encounter (Signed)
Patient called stating that he had been following the directions for surgery tomorrow but he is still taking 81mg  ASA. I told him it should be OK to proceed with surgery as scheduled tomorrow.

## 2020-05-12 NOTE — Addendum Note (Signed)
Addended by: Wallene Huh on: 05/12/2020 05:09 PM   Modules accepted: Orders

## 2020-05-13 ENCOUNTER — Encounter: Payer: Self-pay | Admitting: Podiatry

## 2020-05-13 DIAGNOSIS — M2041 Other hammer toe(s) (acquired), right foot: Secondary | ICD-10-CM | POA: Diagnosis not present

## 2020-05-14 ENCOUNTER — Other Ambulatory Visit: Payer: Self-pay | Admitting: Endocrinology

## 2020-05-21 ENCOUNTER — Ambulatory Visit (INDEPENDENT_AMBULATORY_CARE_PROVIDER_SITE_OTHER): Payer: Commercial Managed Care - PPO | Admitting: Podiatry

## 2020-05-21 ENCOUNTER — Ambulatory Visit (INDEPENDENT_AMBULATORY_CARE_PROVIDER_SITE_OTHER): Payer: Commercial Managed Care - PPO

## 2020-05-21 ENCOUNTER — Other Ambulatory Visit: Payer: Self-pay

## 2020-05-21 ENCOUNTER — Encounter: Payer: Self-pay | Admitting: Podiatry

## 2020-05-21 DIAGNOSIS — M2041 Other hammer toe(s) (acquired), right foot: Secondary | ICD-10-CM | POA: Diagnosis not present

## 2020-05-21 NOTE — Progress Notes (Signed)
Subjective:   Patient ID: Henry Rivera, male   DOB: 64 y.o.   MRN: [id number]   HPI Patient states his toe is feeling good and states he is having minimal discomfort   ROS      Objective:  Physical Exam  Neurovascular status intact negative Bevelyn Buckles' sign noted second digit right healing well wound edges well coapted pin intact second toe with slight elevation of the digit noted     Assessment:  Doing well post digital fusion digit to right with pin intact     Plan:  H&P x-ray reviewed advised to continue to plantarflex the toe reapplied sterile dressing gave instructions for continued immobilization elevation compression and reappoint to recheck 2 weeks suture removal 4 weeks pin removal  X-rays indicate stitches are intact wound edges well coapted and indicated pin is in place with good alignment noted

## 2020-06-02 ENCOUNTER — Other Ambulatory Visit: Payer: Self-pay | Admitting: Endocrinology

## 2020-06-05 ENCOUNTER — Encounter: Payer: Self-pay | Admitting: Podiatry

## 2020-06-05 ENCOUNTER — Other Ambulatory Visit: Payer: Self-pay

## 2020-06-05 ENCOUNTER — Ambulatory Visit: Payer: Commercial Managed Care - PPO

## 2020-06-05 ENCOUNTER — Ambulatory Visit (INDEPENDENT_AMBULATORY_CARE_PROVIDER_SITE_OTHER): Payer: Commercial Managed Care - PPO

## 2020-06-05 ENCOUNTER — Ambulatory Visit (INDEPENDENT_AMBULATORY_CARE_PROVIDER_SITE_OTHER): Payer: Commercial Managed Care - PPO | Admitting: Podiatry

## 2020-06-05 ENCOUNTER — Ambulatory Visit: Payer: Commercial Managed Care - PPO | Admitting: Podiatry

## 2020-06-05 DIAGNOSIS — M2041 Other hammer toe(s) (acquired), right foot: Secondary | ICD-10-CM

## 2020-06-05 NOTE — Progress Notes (Signed)
Subjective:   Patient ID: Henry Rivera, male   DOB: 64 y.o.   MRN: [id number]   HPI Patient presents stating his toes healing well with swelling and he wanted to make sure the pin has not moved   ROS      Objective:  Physical Exam  Neurovascular status intact 2nd digit good alignment with slight distal migration of the pin with wound edges well coapted and stitches intact     Assessment:  Doing well overall digital fusion digit to right with possible distal migration of 10     Plan:  H&P reviewed condition and x-ray and today stitches removed wound edges coapted well and dispensed a brace to lower the toe and to provide for edema reduction across the foot and ankle.  Patient will be seen back 2 weeks pin removal earlier if needed  X-rays indicate slight distal migration of the pin but not significant and should be okay at its current position

## 2020-06-07 ENCOUNTER — Other Ambulatory Visit: Payer: Self-pay | Admitting: Endocrinology

## 2020-06-19 ENCOUNTER — Encounter: Payer: Self-pay | Admitting: Podiatry

## 2020-06-19 ENCOUNTER — Ambulatory Visit (INDEPENDENT_AMBULATORY_CARE_PROVIDER_SITE_OTHER): Payer: Commercial Managed Care - PPO

## 2020-06-19 ENCOUNTER — Other Ambulatory Visit: Payer: Self-pay

## 2020-06-19 ENCOUNTER — Ambulatory Visit (INDEPENDENT_AMBULATORY_CARE_PROVIDER_SITE_OTHER): Payer: Commercial Managed Care - PPO | Admitting: Podiatry

## 2020-06-19 DIAGNOSIS — M205X1 Other deformities of toe(s) (acquired), right foot: Secondary | ICD-10-CM | POA: Diagnosis not present

## 2020-06-19 DIAGNOSIS — M2041 Other hammer toe(s) (acquired), right foot: Secondary | ICD-10-CM

## 2020-06-19 NOTE — Progress Notes (Signed)
Subjective:   Patient ID: Henry Rivera, male   DOB: 64 y.o.   MRN: [id number]   HPI Patient presents stating doing well ready to get this pin out   ROS      Objective:  Physical Exam  Neurovascular status intact second digit Road in good alignment mildly elevated with pin intact wound edges well coapted mild swelling     Assessment:  Overall doing well post digital fusion digit to right foot with pin intact      Plan:  H&P x-ray reviewed pins removed sterile dressing applied instructed on continued plantarflexion with gradual return to soft shoe and patient will be seen back as needed  X-rays indicate good positional component pin intact reviewed with patient

## 2020-07-15 ENCOUNTER — Other Ambulatory Visit: Payer: Commercial Managed Care - PPO

## 2020-07-16 ENCOUNTER — Telehealth: Payer: Self-pay | Admitting: Endocrinology

## 2020-07-16 NOTE — Telephone Encounter (Signed)
Patient called to advise that his One Touch Verio Flex meter is not working.  Replaced batteries and readings are varied and in-accurate.  Please send a new [facility] Touch Verio Flex meter to the [facility] [address]  Ph# [phone]

## 2020-07-17 ENCOUNTER — Ambulatory Visit: Payer: Commercial Managed Care - PPO | Admitting: Endocrinology

## 2020-07-17 ENCOUNTER — Other Ambulatory Visit: Payer: Self-pay | Admitting: *Deleted

## 2020-07-17 MED ORDER — ONETOUCH VERIO FLEX SYSTEM W/DEVICE KIT: PACK | 1 refills | Status: AC

## 2020-07-17 NOTE — Telephone Encounter (Signed)
Rx sent 

## 2020-08-06 ENCOUNTER — Other Ambulatory Visit: Payer: Self-pay

## 2020-08-06 ENCOUNTER — Other Ambulatory Visit (INDEPENDENT_AMBULATORY_CARE_PROVIDER_SITE_OTHER): Payer: Commercial Managed Care - PPO

## 2020-08-06 DIAGNOSIS — E1165 Type 2 diabetes mellitus with hyperglycemia: Secondary | ICD-10-CM

## 2020-08-06 LAB — BASIC METABOLIC PANEL
BUN: 22 mg/dL (ref 6–23)
CO2: 28 mEq/L (ref 19–32)
Calcium: 9.6 mg/dL (ref 8.4–10.5)
Chloride: 104 mEq/L (ref 96–112)
Creatinine, Ser: 1.43 mg/dL (ref 0.40–1.50)
GFR: 51.76 mL/min — ABNORMAL LOW (ref >60.00)
Glucose, Bld: 141 mg/dL — ABNORMAL HIGH (ref 70–99)
Potassium: 4.9 mEq/L (ref 3.5–5.1)
Sodium: 138 mEq/L (ref 135–145)

## 2020-08-06 LAB — HEMOGLOBIN A1C: Hgb A1c MFr Bld: 7.4 % — ABNORMAL HIGH (ref 4.6–6.5)

## 2020-08-06 LAB — MICROALBUMIN / CREATININE URINE RATIO
Creatinine,U: 103.5 mg/dL
Microalb Creat Ratio: 0.7 mg/g (ref 0.0–30.0)
Microalb, Ur: <0.7 mg/dL (ref 0.0–1.9)

## 2020-08-12 ENCOUNTER — Encounter: Payer: Self-pay | Admitting: Endocrinology

## 2020-08-12 ENCOUNTER — Ambulatory Visit: Payer: Commercial Managed Care - PPO | Admitting: Endocrinology

## 2020-08-12 ENCOUNTER — Other Ambulatory Visit: Payer: Self-pay

## 2020-08-12 VITALS — BP 130/76 | HR 76 | Ht 77.0 in | Wt 261.8 lb

## 2020-08-12 DIAGNOSIS — I10 Essential (primary) hypertension: Secondary | ICD-10-CM

## 2020-08-12 DIAGNOSIS — E1165 Type 2 diabetes mellitus with hyperglycemia: Secondary | ICD-10-CM

## 2020-08-12 NOTE — Progress Notes (Signed)
Patient ID: Henry Rivera, male   DOB: 1955/08/25, 65 y.o.   MRN: [id number]            Reason for Appointment: Follow-up for Type 2 Diabetes   History of Present Illness:          Date of diagnosis of type 2 diabetes mellitus: 2007      Background history:   His A1c was apparently 12.7 at the time of diagnosis when he had increased thirst and urination He was started on Janumet and apparently continued unchanged until last year, this was stopped because of high out-of-pocket expense He was subsequently on metformin only and then Jardiance 10 mg started His A1c in 2017 was 6.7 twice, previous records not available.  Recent history:       Non-insulin hypoglycemic drugs the patient is taking are: Farxiga 10 mg,  Trulicity 4.65 mg weekly, metformin 1 g twice a day   His A1c is relatively higher at 7.4  Current management, blood sugar patterns and problems identified:   He has recently been able to do his walking again after her foot surgery  Is walking at least 4 miles in the morning and up to another 3 months later in the day also  [place] was increased to 10 mg on the last visit because of rising A1c  However he thinks because of intercurrent illnesses, lack of exercise and also a steroid injection for bronchitis a couple of months ago his blood sugars are not as good  His weight has come down 5 pounds  Recently most of his blood sugars are near normal with highest reading 186 after dinner  However fasting readings are about 120       Side effects from medications have been: Nausea from high-dose Trulicity  Compliance with the medical regimen: Usually good  Glucose monitoring:  done 1-2 times a day         Glucometer: One Touch ultra mini .      Blood Glucose data   PRE-MEAL Fasting Lunch Dinner Bedtime Overall  Glucose range:      71-186  Mean/median:  120    122 +/-22   POST-MEAL PC Breakfast PC Lunch PC Dinner  Glucose range:     Mean/median:  128    121   Previously:  PRE-MEAL Fasting Lunch Dinner Bedtime Overall  Glucose range:  123-158      Mean/median:  138    147   POST-MEAL PC Breakfast PC Lunch PC Dinner  Glucose range:   137  153-185  Mean/median:    165     Self-care: The diet that the patient has been following is: tries to limit high-fat foods .                  Dietician visit, most recent: none, previously attended classes at cardiac rehabilitation               Weight history: Maximum weight had been 318  Wt Readings from Last 3 Encounters:  08/12/20 261 lb 12.8 oz (118.8 kg)  05/02/20 265 lb (120.2 kg)  04/17/20 266 lb 12.8 oz (121 kg)    Glycemic control:    Lab Results  Component Value Date   HGBA1C 7.4 (H) 08/06/2020   HGBA1C 7.0 (H) 04/10/2020   HGBA1C 6.9 (H) 01/04/2020   Lab Results  Component Value Date   MICROALBUR <0.7 08/06/2020   [place] 22 08/25/2018   CREATININE 1.43 08/06/2020  Lab Results  Component Value Date   MICRALBCREAT 0.7 08/06/2020     Other active problems: See review of systems   Allergies as of 08/12/2020      Reactions   Augmentin [amoxicillin-pot Clavulanate]    Gi sx      Medication List       Accurate as of August 12, 2020 11:50 AM. If you have any questions, ask your nurse or doctor.        aspirin 81 MG tablet Take 81 mg by mouth daily.   CINNAMON PO Take 1,000 capsules by mouth daily.   dapagliflozin propanediol 10 MG Tabs tablet Commonly known as: Farxiga Take 1 tablet (10 mg total) by mouth daily.   Farxiga 5 MG Tabs tablet Generic drug: dapagliflozin propanediol TAKE 5 MG BY MOUTH DAILY.   esomeprazole 40 MG capsule Commonly known as: NEXIUM Take 40 mg by mouth daily at 12 noon.   fexofenadine 180 MG tablet Commonly known as: ALLEGRA Take 180 mg by mouth daily.   fluticasone 50 MCG/ACT nasal spray Commonly known as: FLONASE Place 2 sprays into both nostrils as needed for allergies or rhinitis.   glucose blood test  strip Use as instructed to check blood sugar 1 time daily as needed.   OneTouch Verio test strip Generic drug: glucose blood CHECK SUGAR 2 TIMES A DAY.   metFORMIN 1000 MG tablet Commonly known as: GLUCOPHAGE Take 1,000 mg by mouth 2 (two) times daily with a meal.   nitroGLYCERIN 0.4 MG SL tablet Commonly known as: NITROSTAT Place 1 tablet (0.4 mg total) under the tongue every 5 (five) minutes as needed for chest pain.   OneTouch Verio Flex System w/Device Kit Use to check blood sugar 2 times per day   rosuvastatin 5 MG tablet Commonly known as: CRESTOR TAKE 1 TABLET EVERY OTHER DAY ORALLY   Trulicity 0.10 XN/2.3FT Sopn Generic drug: Dulaglutide INJECT 0.75 MG INTO THE SKIN ONCE A WEEK.   valsartan 320 MG tablet Commonly known as: DIOVAN Take 320 mg by mouth daily.   VITAMIN D PO Take by mouth. 1000 units once daily       Allergies:  Allergies  Allergen Reactions  . Augmentin [Amoxicillin-Pot Clavulanate]     Gi sx    Past Medical History:  Diagnosis Date  . Allergic rhinitis   . Allergy   . CAD (coronary artery disease) 4/12   WITH LAD DES   . Cataract    forming- small   . Diabetes ([place]) 2007  . Elevated cholesterol    pt denies this 11-11-15- states his cholesterol is fine   . GERD (gastroesophageal reflux disease)   . Hyperlipidemia    pt states uses Crestor due to Stent - not HLD   . Hypertension   . MI (mitral incompetence)   . Vitamin D deficiency     Past Surgical History:  Procedure Laterality Date  . COLONOSCOPY  03-27-2004   2017- #2  . CORONARY ANGIOPLASTY WITH STENT PLACEMENT  11-2010  . POLYPECTOMY    . VASECTOMY      Family History  Problem Relation Age of Onset  . Hepatitis Brother   . Diabetes Paternal Grandfather   . Heart failure Paternal Grandfather        CHF  . Colon cancer Neg Hx   . Colon polyps Neg Hx   . Esophageal cancer Neg Hx   . Rectal cancer Neg Hx   . Stomach cancer Neg Hx  Social History:  reports  that he has never smoked. He has never used smokeless tobacco. He reports that he does not drink alcohol and does not use drugs.   Review of Systems   Lipid history:  Baseline cholesterol has been normal, on Crestor for history of CAD, currently only on 5 mg every other day as prescribed by PCP Followed by PCP, last LDL was 25 done in 3/20 and is due for another follow-up  Levels as follows:    Lab Results  Component Value Date   CHOL 75 08/25/2018   HDL 36.80 (L) 08/25/2018   LDLCALC 22 08/25/2018   TRIG 79.0 08/25/2018   CHOLHDL 2 08/25/2018           Hypertension: For this he is taking valsartan 320 mg  He has had long-standing hypertension and is followed by PCP/cardiology  BP Readings from Last 3 Encounters:  08/12/20 130/76  05/02/20 130/72  04/17/20 130/82     Most recent eye exam was 01/2019, sees Dr. Herbert Deaner  NEUROPATHY: He has mild numbness/decrease in monofilament sensation in his toes for some time  Most recent foot exam: 6/21   LABS:  Lab on 08/06/2020  Component Date Value Ref Range Status  . Microalb, Ur 08/06/2020 <0.7  0.0 - 1.9 mg/dL Final  . Creatinine,U 08/06/2020 103.5  mg/dL Final  . Microalb Creat Ratio 08/06/2020 0.7  0.0 - 30.0 mg/g Final  . Sodium 08/06/2020 138  135 - 145 mEq/L Final  . Potassium 08/06/2020 4.9  3.5 - 5.1 mEq/L Final  . Chloride 08/06/2020 104  96 - 112 mEq/L Final  . CO2 08/06/2020 28  19 - 32 mEq/L Final  . Glucose, Bld 08/06/2020 141* 70 - 99 mg/dL Final  . BUN 08/06/2020 22  6 - 23 mg/dL Final  . Creatinine, Ser 08/06/2020 1.43  0.40 - 1.50 mg/dL Final  . GFR 08/06/2020 51.76* >60.00 mL/min Final   Calculated using the CKD-EPI Creatinine Equation (2021)  . Calcium 08/06/2020 9.6  8.4 - 10.5 mg/dL Final  . Hgb A1c MFr Bld 08/06/2020 7.4* 4.6 - 6.5 % Final   Glycemic Control Guidelines for People with Diabetes:Non Diabetic:  <6%Goal of Therapy: <7%Additional Action Suggested:  >8%     Physical Examination:  BP  130/76   Pulse 76   Ht '6\' 5"'  (1.956 m)   Wt 261 lb 12.8 oz (118.8 kg)   SpO2 99%   BMI 31.04 kg/m    ASSESSMENT:  Diabetes type 2, BMI over 30  Current regimen Trulicity 9.24, Farxiga 10 mg and Metformin  See history of present illness for detailed discussion of current diabetes management, blood sugar patterns and problems identified  His A1c is 7.4 %  Although recently blood sugars are averaging only about 120-130 either morning or evening and highest reading only 186 his A1c is likely reflecting higher readings but intermittent illnesses over the last couple of months More recently has done well with weight loss and walking regularly Usually trying to do well with his diet Also monitoring blood sugars regularly No side effects with [place]  HYPERTENSION: Blood pressure well controlled  .  Not monitored much recently on his own  Mild renal dysfunction: Creatinine is slightly higher with increasing [place] were also recently had an episode of gastroenteritis and may be still mildly dehydrated Microalbumin normal  Lipids: To be followed by PCP  PLAN:    No change in medications at this time including Trulicity and [place] Encouraged him to  have plenty of fluid intake with his taking Farxiga 10 mg Also reminded him that if he has any acute illness or GI virus he will need to hold his [place] to prevent dehydration  Check blood pressure regularly at home Follow-up with PCP regarding mild renal dysfunction Labs will be done by PCP in April  There are no Patient Instructions on file for this visit.    Elayne Snare 08/12/2020, 11:50 AM   Note: This office note was prepared with Dragon voice recognition system technology. Any transcriptional errors that result from this process are unintentional.

## 2020-08-18 ENCOUNTER — Other Ambulatory Visit: Payer: Self-pay | Admitting: Endocrinology

## 2020-08-25 ENCOUNTER — Other Ambulatory Visit: Payer: Self-pay | Admitting: Endocrinology

## 2020-09-14 ENCOUNTER — Other Ambulatory Visit: Payer: Self-pay | Admitting: Endocrinology

## 2020-12-02 ENCOUNTER — Other Ambulatory Visit: Payer: Self-pay | Admitting: Endocrinology

## 2020-12-15 ENCOUNTER — Other Ambulatory Visit: Payer: Self-pay | Admitting: Endocrinology

## 2020-12-15 DIAGNOSIS — E1165 Type 2 diabetes mellitus with hyperglycemia: Secondary | ICD-10-CM

## 2020-12-16 ENCOUNTER — Other Ambulatory Visit (INDEPENDENT_AMBULATORY_CARE_PROVIDER_SITE_OTHER): Payer: Commercial Managed Care - PPO

## 2020-12-16 ENCOUNTER — Other Ambulatory Visit: Payer: Self-pay

## 2020-12-16 DIAGNOSIS — E1165 Type 2 diabetes mellitus with hyperglycemia: Secondary | ICD-10-CM | POA: Diagnosis not present

## 2020-12-16 LAB — COMPREHENSIVE METABOLIC PANEL
ALT: 33 U/L (ref 0–53)
AST: 21 U/L (ref 0–37)
Albumin: 4.5 g/dL (ref 3.5–5.2)
Alkaline Phosphatase: 49 U/L (ref 39–117)
BUN: 20 mg/dL (ref 6–23)
CO2: 30 mEq/L (ref 19–32)
Calcium: 9.9 mg/dL (ref 8.4–10.5)
Chloride: 103 mEq/L (ref 96–112)
Creatinine, Ser: 1.26 mg/dL (ref 0.40–1.50)
GFR: 60.09 mL/min (ref >60.00)
Glucose, Bld: 120 mg/dL — ABNORMAL HIGH (ref 70–99)
Potassium: 5.2 mEq/L — ABNORMAL HIGH (ref 3.5–5.1)
Sodium: 139 mEq/L (ref 135–145)
Total Bilirubin: 0.7 mg/dL (ref 0.2–1.2)
Total Protein: 7.2 g/dL (ref 6.0–8.3)

## 2020-12-16 LAB — HEMOGLOBIN A1C: Hgb A1c MFr Bld: 6.9 % — ABNORMAL HIGH (ref 4.6–6.5)

## 2020-12-18 ENCOUNTER — Ambulatory Visit: Payer: Commercial Managed Care - PPO | Admitting: Endocrinology

## 2020-12-18 ENCOUNTER — Other Ambulatory Visit: Payer: Self-pay | Admitting: Endocrinology

## 2020-12-22 NOTE — Progress Notes (Signed)
Patient ID: Henry Rivera, male   DOB: [birth year], 65 y.o.   MRN: [id number]            Reason for Appointment: Follow-up for Type 2 Diabetes   History of Present Illness:          Date of diagnosis of type 2 diabetes mellitus: 2007      Background history:   His A1c was apparently 12.7 at the time of diagnosis when he had increased thirst and urination He was started on Janumet and apparently continued unchanged until last year, this was stopped because of high out-of-pocket expense He was subsequently on metformin only and then Jardiance 10 mg started His A1c in 2017 was 6.7 twice, previous records not available.  Recent history:       Non-insulin hypoglycemic drugs the patient is taking are: Farxiga 10 mg,  Trulicity 9.93 mg weekly, metformin 1 g twice a day   His A1c is relatively better at 6.9, was at 7.4  Current management, blood sugar patterns and problems identified:   His blood sugars are usually in the target range after supper and mildly increased in the mornings as before  He has been gradually losing weight  Currently walking up to 5 miles a day, somewhat limited by chest wall pain  Also he thinks that he is trying to usually watch his portions and any high fat foods and desserts, occasionally may have some [place] fries  Also has increased his water intake  No side effects with [place] and renal function is improving  As before fasting readings are about 120 average       Side effects from medications have been: Nausea from high-dose Trulicity  Compliance with the medical regimen: Usually good  Glucose monitoring:  done 1-2 times a day         Glucometer: One Touch ultra mini .      Blood Glucose data   PRE-MEAL Fasting Lunch Dinner Bedtime Overall  Glucose range: 107-138      Mean/median:  121    124   POST-MEAL PC Breakfast PC Lunch PC Dinner  Glucose range:    83-193  Mean/median:   133   Previously:  PRE-MEAL Fasting Lunch Dinner  Bedtime Overall  Glucose range:      71-186  Mean/median:  120    122 +/-22   POST-MEAL PC Breakfast PC Lunch PC Dinner  Glucose range:     Mean/median:  128   121     Self-care: The diet that the patient has been following is: tries to limit high-fat foods .                  Dietician visit, most recent: none, previously attended classes at cardiac rehabilitation               Weight history: Maximum weight had been 318  Wt Readings from Last 3 Encounters:  12/23/20 258 lb 8 oz (117.3 kg)  08/12/20 261 lb 12.8 oz (118.8 kg)  05/02/20 265 lb (120.2 kg)    Glycemic control:    Lab Results  Component Value Date   HGBA1C 6.9 (H) 12/16/2020   HGBA1C 7.4 (H) 08/06/2020   HGBA1C 7.0 (H) 04/10/2020   Lab Results  Component Value Date   MICROALBUR <0.7 08/06/2020   [place] 22 08/25/2018   CREATININE 1.26 12/16/2020   Lab Results  Component Value Date   MICRALBCREAT 0.7 08/06/2020     Other active  problems: See review of systems   Allergies as of 12/23/2020      Reactions   Augmentin [amoxicillin-pot Clavulanate]    Gi sx      Medication List       Accurate as of Dec 23, 2020 10:50 AM. If you have any questions, ask your nurse or doctor.        aspirin 81 MG tablet Take 81 mg by mouth daily.   CINNAMON PO Take 1,000 capsules by mouth daily.   esomeprazole 40 MG capsule Commonly known as: NEXIUM Take 40 mg by mouth daily at 12 noon.   Farxiga 10 MG Tabs tablet Generic drug: dapagliflozin propanediol TAKE 1 TABLET BY MOUTH EVERY DAY   fexofenadine 180 MG tablet Commonly known as: ALLEGRA Take 180 mg by mouth daily.   fluticasone 50 MCG/ACT nasal spray Commonly known as: FLONASE Place 2 sprays into both nostrils as needed for allergies or rhinitis.   glucose blood test strip Use as instructed to check blood sugar 1 time daily as needed.   OneTouch Verio test strip Generic drug: glucose blood CHECK SUGAR 2 TIMES A DAY.   metFORMIN 1000 MG  tablet Commonly known as: GLUCOPHAGE Take 1,000 mg by mouth 2 (two) times daily with a meal.   nitroGLYCERIN 0.4 MG SL tablet Commonly known as: NITROSTAT Place 1 tablet (0.4 mg total) under the tongue every 5 (five) minutes as needed for chest pain.   OneTouch Verio Flex System w/Device Kit Use to check blood sugar 2 times per day   rosuvastatin 5 MG tablet Commonly known as: CRESTOR TAKE 1 TABLET EVERY OTHER DAY ORALLY   Trulicity 0.34 JZ/7.9XT Sopn Generic drug: Dulaglutide INJECT 0.75 MG INTO THE SKIN ONCE A WEEK.   valsartan 320 MG tablet Commonly known as: DIOVAN Take 320 mg by mouth daily.   VITAMIN D PO Take by mouth. 1000 units once daily       Allergies:  Allergies  Allergen Reactions  . Augmentin [Amoxicillin-Pot Clavulanate]     Gi sx    Past Medical History:  Diagnosis Date  . Allergic rhinitis   . Allergy   . CAD (coronary artery disease) 4/12   WITH LAD DES   . Cataract    forming- small   . Diabetes ([place]) 2007  . Elevated cholesterol    pt denies this 11-11-15- states his cholesterol is fine   . GERD (gastroesophageal reflux disease)   . Hyperlipidemia    pt states uses Crestor due to Stent - not HLD   . Hypertension   . MI (mitral incompetence)   . Vitamin D deficiency     Past Surgical History:  Procedure Laterality Date  . COLONOSCOPY  03-27-2004   2017- #2  . CORONARY ANGIOPLASTY WITH STENT PLACEMENT  11-2010  . POLYPECTOMY    . VASECTOMY      Family History  Problem Relation Age of Onset  . Hepatitis Brother   . Diabetes Paternal Grandfather   . Heart failure Paternal Grandfather        CHF  . Colon cancer Neg Hx   . Colon polyps Neg Hx   . Esophageal cancer Neg Hx   . Rectal cancer Neg Hx   . Stomach cancer Neg Hx     Social History:  reports that he has never smoked. He has never used smokeless tobacco. He reports that he does not drink alcohol and does not use drugs.   Review of Systems   Lipid  history:  Baseline  cholesterol has been normal, on Crestor for history of CAD, only on 5 mg every other day as prescribed by PCP  Followed by PCP, last LDL was 25 done in 3/20 and is due for another follow-up  Levels as follows:    Lab Results  Component Value Date   CHOL 75 08/25/2018   HDL 36.80 (L) 08/25/2018   LDLCALC 22 08/25/2018   TRIG 79.0 08/25/2018   CHOLHDL 2 08/25/2018           Hypertension: For this he is taking valsartan 320 mg  He has had long-standing hypertension and is followed by PCP/cardiology  BP Readings from Last 3 Encounters:  12/23/20 128/82  08/12/20 130/76  05/02/20 130/72   Renal function: Improved with reducing NSAIDs but potassium is still slightly high, previously 5.7 Lab Results  Component Value Date   K 5.2 (H) 12/16/2020     Lab Results  Component Value Date   CREATININE 1.26 12/16/2020   CREATININE 1.43 08/06/2020   CREATININE 1.30 04/10/2020     Most recent eye exam was probably in 2021, sees Dr. Herbert Deaner  NEUROPATHY: He has mild numbness/decrease in monofilament sensation in his toes for some time  Most recent foot exam: 6/21   LABS:  No visits with results within 1 Week(s) from this visit.  Latest known visit with results is:  Lab on 12/16/2020  Component Date Value Ref Range Status  . Sodium 12/16/2020 139  135 - 145 mEq/L Final  . Potassium 12/16/2020 5.2* 3.5 - 5.1 mEq/L Final  . Chloride 12/16/2020 103  96 - 112 mEq/L Final  . CO2 12/16/2020 30  19 - 32 mEq/L Final  . Glucose, Bld 12/16/2020 120* 70 - 99 mg/dL Final  . BUN 12/16/2020 20  6 - 23 mg/dL Final  . Creatinine, Ser 12/16/2020 1.26  0.40 - 1.50 mg/dL Final  . Total Bilirubin 12/16/2020 0.7  0.2 - 1.2 mg/dL Final  . Alkaline Phosphatase 12/16/2020 49  39 - 117 U/L Final  . AST 12/16/2020 21  0 - 37 U/L Final  . ALT 12/16/2020 33  0 - 53 U/L Final  . Total Protein 12/16/2020 7.2  6.0 - 8.3 g/dL Final  . Albumin 12/16/2020 4.5  3.5 - 5.2 g/dL Final  . GFR 12/16/2020 60.09   >60.00 mL/min Final   Calculated using the CKD-EPI Creatinine Equation (2021)  . Calcium 12/16/2020 9.9  8.4 - 10.5 mg/dL Final  . Hgb A1c MFr Bld 12/16/2020 6.9* 4.6 - 6.5 % Final   Glycemic Control Guidelines for People with Diabetes:Non Diabetic:  <6%Goal of Therapy: <7%Additional Action Suggested:  >8%     Physical Examination:  BP 128/82 (BP Location: Left Arm, Patient Position: Sitting, Cuff Size: Normal)   Pulse 78   Ht '6\' 5"'  (1.956 m)   Wt 258 lb 8 oz (117.3 kg)   SpO2 97%   BMI 30.65 kg/m    ASSESSMENT:  Diabetes type 2, BMI 30  Current regimen isTrulicity 9.79, Farxiga 10 mg and Metformin  See history of present illness for detailed discussion of current diabetes management, blood sugar patterns and problems identified  His A1c is improved at 6.9 compared to 7.4 %  Blood sugars at home are excellent and as before mildly increased fasting compared to postprandial Has been able to gradually lose weight and is usually consistent with his meal planning Also may be having better efficacy of Farxiga with slightly better renal function Has been  quite consistent with monitoring his blood sugars even after meals  HYPERTENSION: Blood pressure well controlled   Mild renal dysfunction: Creatinine is improved likely from cutting out Advil and Aleve Encouraged him to stay hydrated especially in summer  Hyperkalemia: Still borderline high and may be related to his using valsartan as well as mild hyporeninemic hypoaldosteronism   Lipids: LDL well below 70 as before  PLAN:    Discussed blood sugar targets after meals and fasting Since he had difficulty tolerating higher doses of Trulicity will stay on the same dose for now  Check blood pressure periodically at home  Schedule annual eye exam  Discussed high potassium foods and need to avoid these May need to consider reducing doses valsartan and will defer to PCP  Patient Instructions  Avoid hi potassium  foods     Elayne Snare 12/23/2020, 10:50 AM   Note: This office note was prepared with Dragon voice recognition system technology. Any transcriptional errors that result from this process are unintentional.

## 2020-12-23 ENCOUNTER — Ambulatory Visit: Payer: Commercial Managed Care - PPO | Admitting: Endocrinology

## 2020-12-23 ENCOUNTER — Encounter: Payer: Self-pay | Admitting: Endocrinology

## 2020-12-23 ENCOUNTER — Other Ambulatory Visit: Payer: Self-pay

## 2020-12-23 VITALS — BP 128/82 | HR 78 | Ht 77.0 in | Wt 258.5 lb

## 2020-12-23 DIAGNOSIS — E1165 Type 2 diabetes mellitus with hyperglycemia: Secondary | ICD-10-CM

## 2020-12-23 DIAGNOSIS — E875 Hyperkalemia: Secondary | ICD-10-CM

## 2020-12-23 DIAGNOSIS — I10 Essential (primary) hypertension: Secondary | ICD-10-CM

## 2020-12-23 NOTE — Patient Instructions (Signed)
Avoid hi potassium foods

## 2021-01-01 DIAGNOSIS — S29012A Strain of muscle and tendon of back wall of thorax, initial encounter: Secondary | ICD-10-CM | POA: Diagnosis not present

## 2021-02-28 ENCOUNTER — Other Ambulatory Visit: Payer: Self-pay | Admitting: Endocrinology

## 2021-04-05 DIAGNOSIS — M542 Cervicalgia: Secondary | ICD-10-CM | POA: Diagnosis not present

## 2021-04-08 ENCOUNTER — Encounter: Payer: Self-pay | Admitting: Podiatry

## 2021-04-08 ENCOUNTER — Ambulatory Visit (INDEPENDENT_AMBULATORY_CARE_PROVIDER_SITE_OTHER): Payer: Commercial Managed Care - PPO

## 2021-04-08 ENCOUNTER — Ambulatory Visit (INDEPENDENT_AMBULATORY_CARE_PROVIDER_SITE_OTHER): Payer: Commercial Managed Care - PPO | Admitting: Podiatry

## 2021-04-08 ENCOUNTER — Other Ambulatory Visit: Payer: Self-pay

## 2021-04-08 DIAGNOSIS — M2041 Other hammer toe(s) (acquired), right foot: Secondary | ICD-10-CM | POA: Diagnosis not present

## 2021-04-08 DIAGNOSIS — M205X1 Other deformities of toe(s) (acquired), right foot: Secondary | ICD-10-CM | POA: Diagnosis not present

## 2021-04-08 NOTE — Progress Notes (Signed)
Subjective:   Patient ID: Henry Rivera, male   DOB: 65 y.o.   MRN: [id number]   HPI Patient presents stating that he was concerned about the position of his second toe.  States it is not hurting at all he just wanted it checked   ROS      Objective:  Physical Exam  Neurovascular status intact with patient second digit right mildly elevated but flat and no longer red or irritated     Assessment:  Overall doing well with digital surgery second right good alignment noted no indications of bone pathology but there was some pressure from the hallux and third toe that is slightly lifted it which we expected prior to surgery     Plan:  H&P reviewed condition and discussed that I do not think it will go any further than this I think will be fine as far as his overall alignment.  Patient is discharged will be seen back as needed  X-rays indicate that there is satisfactory valgus of the digit mildly elevated but not pathological and should not get worse over time.  Will be seen back if any symptoms were to start to occur

## 2021-04-17 ENCOUNTER — Other Ambulatory Visit: Payer: Self-pay | Admitting: Endocrinology

## 2021-05-08 DIAGNOSIS — N1831 Chronic kidney disease, stage 3a: Secondary | ICD-10-CM | POA: Diagnosis not present

## 2021-05-08 DIAGNOSIS — Z955 Presence of coronary angioplasty implant and graft: Secondary | ICD-10-CM | POA: Diagnosis not present

## 2021-05-08 DIAGNOSIS — R3912 Poor urinary stream: Secondary | ICD-10-CM | POA: Diagnosis not present

## 2021-05-08 DIAGNOSIS — Z7982 Long term (current) use of aspirin: Secondary | ICD-10-CM | POA: Diagnosis not present

## 2021-05-08 DIAGNOSIS — E1122 Type 2 diabetes mellitus with diabetic chronic kidney disease: Secondary | ICD-10-CM | POA: Diagnosis not present

## 2021-05-08 DIAGNOSIS — K219 Gastro-esophageal reflux disease without esophagitis: Secondary | ICD-10-CM | POA: Diagnosis not present

## 2021-05-08 DIAGNOSIS — I2510 Atherosclerotic heart disease of native coronary artery without angina pectoris: Secondary | ICD-10-CM | POA: Diagnosis not present

## 2021-05-08 DIAGNOSIS — I129 Hypertensive chronic kidney disease with stage 1 through stage 4 chronic kidney disease, or unspecified chronic kidney disease: Secondary | ICD-10-CM | POA: Diagnosis not present

## 2021-05-08 DIAGNOSIS — E559 Vitamin D deficiency, unspecified: Secondary | ICD-10-CM | POA: Diagnosis not present

## 2021-05-24 ENCOUNTER — Other Ambulatory Visit: Payer: Self-pay | Admitting: Endocrinology

## 2021-05-26 ENCOUNTER — Other Ambulatory Visit: Payer: Self-pay

## 2021-05-26 ENCOUNTER — Other Ambulatory Visit (INDEPENDENT_AMBULATORY_CARE_PROVIDER_SITE_OTHER): Payer: Commercial Managed Care - PPO

## 2021-05-26 DIAGNOSIS — E1165 Type 2 diabetes mellitus with hyperglycemia: Secondary | ICD-10-CM

## 2021-05-26 LAB — HEMOGLOBIN A1C: Hgb A1c MFr Bld: 6.6 % — ABNORMAL HIGH (ref 4.6–6.5)

## 2021-05-26 LAB — BASIC METABOLIC PANEL
BUN: 18 mg/dL (ref 6–23)
CO2: 27 mEq/L (ref 19–32)
Calcium: 9.7 mg/dL (ref 8.4–10.5)
Chloride: 103 mEq/L (ref 96–112)
Creatinine, Ser: 1.2 mg/dL (ref 0.40–1.50)
GFR: 63.52 mL/min (ref >60.00)
Glucose, Bld: 108 mg/dL — ABNORMAL HIGH (ref 70–99)
Potassium: 5.1 mEq/L (ref 3.5–5.1)
Sodium: 138 mEq/L (ref 135–145)

## 2021-05-27 NOTE — Progress Notes (Signed)
Patient ID: Henry Rivera, male   DOB: 03/20/1956, 65 y.o.   MRN: [id number]            Reason for Appointment: Follow-up visit   History of Present Illness:          Date of diagnosis of type 2 diabetes mellitus: 2007      Background history:   His A1c was apparently 12.7 at the time of diagnosis when he had increased thirst and urination He was started on Janumet and apparently continued unchanged until last year, this was stopped because of high out-of-pocket expense He was subsequently on metformin only and then Jardiance 10 mg started His A1c in 2017 was 6.7 twice, previous records not available.  Recent history:       Non-insulin hypoglycemic drugs the patient is taking are: Farxiga 10 mg,  Trulicity 6.60 mg weekly, metformin 1 g twice a day   His A1c is relatively better at 6.6, was at 7.4 in 1/22  Current management, blood sugar patterns and problems identified:  He has had gradually improving control this year with likely controlled lifestyle and increasing exercise He is trying to do some exercise at the gym at his work in addition to walking up to 6 miles a day His walking is only limited some by a hammertoe Blood sugars are very consistent at home and averaging slightly better at night also compared to the last visit No nausea with Trulicity He has been gradually losing weight Generally able to watch portions and carbohydrates in his diet No side effects with [place] and renal function is stable and As before fasting readings are averaging about 120 He is trying to check readings fasting and 2 hours after dinner       Side effects from medications have been: Nausea from high-dose Trulicity  Compliance with the medical regimen: Usually good  Glucose monitoring:  done 1-2 times a day         Glucometer: One Touch ultra mini .      Blood Glucose data for the last 30 days shows overall range 87-175  FASTING average 142 The after dinner average  126  Previously:  PRE-MEAL Fasting Lunch Dinner Bedtime Overall  Glucose range: 107-138      Mean/median:  121    124   POST-MEAL PC Breakfast PC Lunch PC Dinner  Glucose range:    83-193  Mean/median:   133   Previously:  PRE-MEAL Fasting Lunch Dinner Bedtime Overall  Glucose range:      71-186  Mean/median:  120    122 +/-22   POST-MEAL PC Breakfast PC Lunch PC Dinner  Glucose range:     Mean/median:  128   121     Self-care: The diet that the patient has been following is: tries to limit high-fat foods .                  Dietician visit, most recent: none, previously attended classes at cardiac rehabilitation               Weight history: Maximum weight had been 318  Wt Readings from Last 3 Encounters:  05/28/21 251 lb 6.4 oz (114 kg)  12/23/20 258 lb 8 oz (117.3 kg)  08/12/20 261 lb 12.8 oz (118.8 kg)    Glycemic control:    Lab Results  Component Value Date   HGBA1C 6.6 (H) 05/26/2021   HGBA1C 6.9 (H) 12/16/2020   HGBA1C 7.4 (H) 08/06/2020  Lab Results  Component Value Date   MICROALBUR <0.7 08/06/2020   LDLCALC 22 08/25/2018   CREATININE 1.20 05/26/2021   Lab Results  Component Value Date   MICRALBCREAT 0.7 08/06/2020     Other active problems: See review of systems   Allergies as of 05/28/2021       Reactions   Augmentin [amoxicillin-pot Clavulanate]    Gi sx   Fluogen [influenza Virus Vaccine]    Other reaction(s): Unknown        Medication List        Accurate as of May 28, 2021  9:11 AM. If you have any questions, ask your nurse or doctor.          aspirin 81 MG tablet Take 81 mg by mouth daily.   CINNAMON PO Take 1,000 capsules by mouth daily.   esomeprazole 40 MG capsule Commonly known as: NEXIUM Take 40 mg by mouth daily at 12 noon.   Farxiga 10 MG Tabs tablet Generic drug: dapagliflozin propanediol TAKE 1 TABLET BY MOUTH EVERY DAY   fexofenadine 180 MG tablet Commonly known as: ALLEGRA Take 180 mg by  mouth daily.   fluticasone 50 MCG/ACT nasal spray Commonly known as: FLONASE Place 2 sprays into both nostrils as needed for allergies or rhinitis.   glucose blood test strip Use as instructed to check blood sugar 1 time daily as needed.   OneTouch Verio test strip Generic drug: glucose blood CHECK SUGAR 2 TIMES A DAY.   metFORMIN 1000 MG tablet Commonly known as: GLUCOPHAGE Take 1,000 mg by mouth 2 (two) times daily with a meal.   nitroGLYCERIN 0.4 MG SL tablet Commonly known as: NITROSTAT Place 1 tablet (0.4 mg total) under the tongue every 5 (five) minutes as needed for chest pain.   OneTouch Verio Flex System w/Device Kit Use to check blood sugar 2 times per day   rosuvastatin 5 MG tablet Commonly known as: CRESTOR TAKE 1 TABLET EVERY OTHER DAY ORALLY   Trulicity 7.91 TA/5.6PV Sopn Generic drug: Dulaglutide INJECT 0.75 MG INTO THE SKIN ONCE A WEEK.   valsartan 320 MG tablet Commonly known as: DIOVAN Take 320 mg by mouth daily.   VITAMIN D PO Take by mouth. 1000 units once daily        Allergies:  Allergies  Allergen Reactions   Augmentin [Amoxicillin-Pot Clavulanate]     Gi sx   Fluogen [Influenza Virus Vaccine]     Other reaction(s): Unknown    Past Medical History:  Diagnosis Date   Allergic rhinitis    Allergy    CAD (coronary artery disease) 4/12   WITH LAD DES    Cataract    forming- small    Diabetes ([place]) 2007   Elevated cholesterol    pt denies this 11-11-15- states his cholesterol is fine    GERD (gastroesophageal reflux disease)    Hyperlipidemia    pt states uses Crestor due to Stent - not HLD    Hypertension    MI (mitral incompetence)    Vitamin D deficiency     Past Surgical History:  Procedure Laterality Date   COLONOSCOPY  03-27-2004   2017- #2   CORONARY ANGIOPLASTY WITH STENT PLACEMENT  11-2010   POLYPECTOMY     VASECTOMY      Family History  Problem Relation Age of Onset   Hepatitis Brother    Diabetes Paternal  Grandfather    Heart failure Paternal Grandfather        CHF  Colon cancer Neg Hx    Colon polyps Neg Hx    Esophageal cancer Neg Hx    Rectal cancer Neg Hx    Stomach cancer Neg Hx     Social History:  reports that he has never smoked. He has never used smokeless tobacco. He reports that he does not drink alcohol and does not use drugs.   Review of Systems   Lipid history:  Baseline cholesterol has been normal, on Crestor for history of CAD, only on 5 mg every other day as prescribed by PCP  Followed by PCP, last LDL was 25 done in 3/20 and is due for another follow-up  Levels as follows:    Lab Results  Component Value Date   CHOL 75 08/25/2018   HDL 36.80 (L) 08/25/2018   LDLCALC 22 08/25/2018   TRIG 79.0 08/25/2018   CHOLHDL 2 08/25/2018           Hypertension: Treated by PCP with valsartan 320 mg  He has had long-standing hypertension and is followed by PCP/cardiology  BP Readings from Last 3 Encounters:  05/28/21 120/76  12/23/20 128/82  08/12/20 130/76   Renal function: Improved with reducing NSAIDs  Tends to have high normal potassium, he is still taking valsartan  Lab Results  Component Value Date   K 5.1 05/26/2021     Lab Results  Component Value Date   CREATININE 1.20 05/26/2021   CREATININE 1.26 12/16/2020   CREATININE 1.43 08/06/2020    Has had foot exam with podiatrist and PCP annually  NEUROPATHY: He has mild numbness/decrease in monofilament sensation in his toes for some time    LABS:  Lab on 05/26/2021  Component Date Value Ref Range Status   Sodium 05/26/2021 138  135 - 145 mEq/L Final   Potassium 05/26/2021 5.1  3.5 - 5.1 mEq/L Final   Chloride 05/26/2021 103  96 - 112 mEq/L Final   CO2 05/26/2021 27  19 - 32 mEq/L Final   Glucose, Bld 05/26/2021 108 (A)  70 - 99 mg/dL Final   BUN 05/26/2021 18  6 - 23 mg/dL Final   Creatinine, Ser 05/26/2021 1.20  0.40 - 1.50 mg/dL Final   GFR 05/26/2021 63.52  >60.00 mL/min Final    Calculated using the CKD-EPI Creatinine Equation (2021)   Calcium 05/26/2021 9.7  8.4 - 10.5 mg/dL Final   Hgb A1c MFr Bld 05/26/2021 6.6 (A)  4.6 - 6.5 % Final   Glycemic Control Guidelines for People with Diabetes:Non Diabetic:  <6%Goal of Therapy: <7%Additional Action Suggested:  >8%     Physical Examination:  BP 120/76 (BP Location: Left Arm, Patient Position: Sitting, Cuff Size: Normal)   Pulse 73   Ht '6\' 5"'  (1.956 m)   Wt 251 lb 6.4 oz (114 kg)   SpO2 98%   BMI 29.81 kg/m    ASSESSMENT:  Diabetes type 2, non-insulin-dependent  Current regimen isTrulicity 9.02, Farxiga 10 mg and Metformin 2 g  See history of present illness for detailed discussion of current diabetes management, blood sugar patterns and problems identified  His A1c is further improved at 6.6, previously was at 6.9 compared to 7.4 %  He continues to lose weight progressively  Has added more exercise in addition to his walking and this is likely helping also Blood sugars at home are excellent including after meals His only concern today was the cost of Trulicity in the donut hole  HYPERTENSION: Blood pressure well controlled on Diovan  Mild renal  dysfunction: Creatinine is stable, previously slightly impaired with using OTC nonsteroidal drugs  Hyperkalemia: As before his borderline high and may be related to his using valsartan and will need to continue monitoring  Microalbumin normal with PCP recently   PLAN:    He can cut back on checking his blood sugar to every other day in the morning and continue monitoring periodically after different meals  Coupon for 07-TVP supply of Trulicity given He will stay on 0.75 mg weekly as well as 10 mg Farxiga  Continue follow-up with podiatrist as needed Regular foot exams    There are no Patient Instructions on file for this visit.     Elayne Snare 05/28/2021, 9:11 AM   Note: This office note was prepared with Dragon voice recognition system  technology. Any transcriptional errors that result from this process are unintentional.

## 2021-05-28 ENCOUNTER — Other Ambulatory Visit: Payer: Self-pay

## 2021-05-28 ENCOUNTER — Ambulatory Visit (INDEPENDENT_AMBULATORY_CARE_PROVIDER_SITE_OTHER): Payer: Commercial Managed Care - PPO | Admitting: Endocrinology

## 2021-05-28 ENCOUNTER — Other Ambulatory Visit: Payer: Self-pay | Admitting: Endocrinology

## 2021-05-28 ENCOUNTER — Encounter: Payer: Self-pay | Admitting: Endocrinology

## 2021-05-28 VITALS — BP 120/76 | HR 73 | Ht 77.0 in | Wt 251.4 lb

## 2021-05-28 DIAGNOSIS — E1169 Type 2 diabetes mellitus with other specified complication: Secondary | ICD-10-CM | POA: Diagnosis not present

## 2021-05-28 DIAGNOSIS — E669 Obesity, unspecified: Secondary | ICD-10-CM | POA: Diagnosis not present

## 2021-06-09 NOTE — Progress Notes (Signed)
Cardiology Office Note:    Date:  06/11/2021   ID:  Henry Rivera, DOB 10/20/1955, MRN [id number]  PCP:  Deland Pretty, MD  Cardiologist:  Sinclair Grooms, MD   Referring MD: Deland Pretty, MD   Chief Complaint  Patient presents with   Coronary Artery Disease     History of Present Illness:    Henry Rivera is a 65 y.o. male with a hx of  CAD with LAD DES, DM II with hemoglobin A1c typically less than 6.5, hyperlipidemia with LDL 23 03/2017, and essential hypertension.   He is doing well.  He denies angina.  He has not had syncope.  No lower extremity swelling.  He is compliant with his medication regimen.  Walks greater than 2 miles every day.  Past Medical History:  Diagnosis Date   Allergic rhinitis    Allergy    CAD (coronary artery disease) 4/12   WITH LAD DES    Cataract    forming- small    Diabetes ([place]) 2007   Elevated cholesterol    pt denies this 11-11-15- states his cholesterol is fine    GERD (gastroesophageal reflux disease)    Hyperlipidemia    pt states uses Crestor due to Stent - not HLD    Hypertension    MI (mitral incompetence)    Vitamin D deficiency     Past Surgical History:  Procedure Laterality Date   COLONOSCOPY  03-27-2004   2017- #2   CORONARY ANGIOPLASTY WITH STENT PLACEMENT  11-2010   POLYPECTOMY     VASECTOMY      Current Medications: Current Meds  Medication Sig   aspirin 81 MG tablet Take 81 mg by mouth daily.   Blood Glucose Monitoring Suppl (ONETOUCH VERIO FLEX SYSTEM) w/Device KIT Use to check blood sugar 2 times per day   Cholecalciferol (VITAMIN D PO) Take by mouth. 1000 units once daily   CINNAMON PO Take 1,000 capsules by mouth daily.   esomeprazole (NEXIUM) 40 MG capsule Take 40 mg by mouth daily at 12 noon.   FARXIGA 10 MG TABS tablet TAKE 1 TABLET BY MOUTH EVERY DAY   fexofenadine (ALLEGRA) 180 MG tablet Take 180 mg by mouth daily.   fluticasone (FLONASE) 50 MCG/ACT nasal spray Place 2 sprays into both  nostrils as needed for allergies or rhinitis.   glucose blood test strip Use as instructed to check blood sugar 1 time daily as needed.   metFORMIN (GLUCOPHAGE) 1000 MG tablet Take 1,000 mg by mouth 2 (two) times daily with a meal.   naproxen (NAPROSYN) 500 MG tablet Take 500 mg by mouth as needed.   nitroGLYCERIN (NITROSTAT) 0.4 MG SL tablet Place 1 tablet (0.4 mg total) under the tongue every 5 (five) minutes as needed for chest pain.   ONETOUCH VERIO test strip CHECK SUGAR 2 TIMES A DAY.   rosuvastatin (CRESTOR) 5 MG tablet TAKE 1 TABLET EVERY OTHER DAY ORALLY   TRULICITY 9.53 XY/7.2WV SOPN INJECT 0.75 MG INTO THE SKIN ONCE A WEEK.   valsartan (DIOVAN) 320 MG tablet Take 320 mg by mouth daily.     Allergies:   Augmentin [amoxicillin-pot clavulanate] and Fluogen [influenza virus vaccine]   Social History   Socioeconomic History   Marital status: Married    Spouse name: Not on file   Number of children: Not on file   Years of education: Not on file   Highest education level: Not on file  Occupational History  Not on file  Tobacco Use   Smoking status: Never   Smokeless tobacco: Never  Vaping Use   Vaping Use: Never used  Substance and Sexual Activity   Alcohol use: No    Alcohol/week: 0.0 standard drinks   Drug use: No   Sexual activity: Not on file  Other Topics Concern   Not on file  Social History Narrative   Not on file   Social Determinants of Health   Financial Resource Strain: Not on file  Food Insecurity: Not on file  Transportation Needs: Not on file  Physical Activity: Not on file  Stress: Not on file  Social Connections: Not on file     Family History: The patient's family history includes Diabetes in his paternal grandfather; Heart failure in his paternal grandfather; Hepatitis in his brother. There is no history of Colon cancer, Colon polyps, Esophageal cancer, Rectal cancer, or Stomach cancer.  ROS:   Please see the history of present illness.     More than 15-monthhistory of itching in his hands and feet at night.  Will be seeing Dr. TDenna Haggard  All other systems reviewed and are negative.  EKGs/Labs/Other Studies Reviewed:    The following studies were reviewed today: No recent cardiac evaluation. A1c 6.6 May 26, 2021 and LDL less than 50 September 2022  EKG:  EKG normal sinus rhythm, interventricular conduction delay/incomplete left bundle branch block.  Normal PR interval.  When compared to prior, QRS duration is slightly greater and QRS axis is less leftward.  Recent Labs: 12/16/2020: ALT 33 05/26/2021: BUN 18; Creatinine, Ser 1.20; Potassium 5.1; Sodium 138  Recent Lipid Panel    Component Value Date/Time   CHOL 75 08/25/2018 0757   TRIG 79.0 08/25/2018 0757   HDL 36.80 (L) 08/25/2018 0757   CHOLHDL 2 08/25/2018 0757   VLDL 15.8 08/25/2018 0757   LDLCALC 22 08/25/2018 0757    Physical Exam:    VS:  BP 132/78   Pulse 75   Ht _0  (1.956 m)   Wt 256 lb 12.8 oz (116.5 kg)   SpO2 96%   BMI 30.45 kg/m     Wt Readings from Last 3 Encounters:  06/11/21 256 lb 12.8 oz (116.5 kg)  05/28/21 251 lb 6.4 oz (114 kg)  12/23/20 258 lb 8 oz (117.3 kg)     GEN: Mildly overweight. No acute distress HEENT: Normal NECK: No JVD. LYMPHATICS: No lymphadenopathy CARDIAC: No murmur. RRR no gallop, or edema. VASCULAR:  Normal Pulses. No bruits. RESPIRATORY:  Clear to auscultation without rales, wheezing or rhonchi  ABDOMEN: Soft, non-tender, non-distended, No pulsatile mass, MUSCULOSKELETAL: No deformity  SKIN: Warm and dry NEUROLOGIC:  Alert and oriented x 3 PSYCHIATRIC:  Normal affect   ASSESSMENT:    1. Coronary artery disease involving native coronary artery of native heart without angina pectoris   2. Essential hypertension   3. Other hyperlipidemia   4. Diabetic peripheral neuropathy [place] type 2 diabetes [place]    PLAN:    In order of problems listed above:  Secondary prevention reviewed.   He is getting ample physical activity, lipids are controlled and hemoglobin A1c is below target.  Continue same. Excellent blood pressure control on current therapy that includes Diovan 320 mg daily Continue low intensity statin therapy with rosuvastatin 5 mg/day. The hand and foot itching could potentially be neuropathy from diabetes.  We will see what Dr. TDenna Haggardhas to offer.   Overall education and awareness concerning secondary  risk prevention was discussed in detail: LDL less than 70, hemoglobin A1c less than 7, blood pressure target less than 130/80 mmHg, >150 minutes of moderate aerobic activity per week, avoidance of smoking, weight control (via diet and exercise), and continued surveillance/management of/for obstructive sleep apnea.    Medication Adjustments/Labs and Tests Ordered: Current medicines are reviewed at length with the patient today.  Concerns regarding medicines are outlined above.  Orders Placed This Encounter  Procedures   EKG 12-Lead   No orders of the defined types were placed in this encounter.   Patient Instructions  Medication Instructions:  Your physician recommends that you continue on your current medications as directed. Please refer to the Current Medication list given to you today.  *If you need a refill on your cardiac medications before your next appointment, please call your pharmacy*   Lab Work: None If you have labs (blood work) drawn today and your tests are completely normal, you will receive your results only by: [place] (if you have MyChart) OR A paper copy in the mail If you have any lab test that is abnormal or we need to change your treatment, we will call you to review the results.   Testing/Procedures: None   Follow-Up: At [facility], you and your health needs are our priority.  As part of our continuing mission to provide you with exceptional heart care, we have created designated Provider Care Teams.  These Care  Teams include your primary Cardiologist (physician) and Advanced Practice Providers (APPs -  Physician Assistants and Nurse Practitioners) who all work together to provide you with the care you need, when you need it.  We recommend signing up for the patient portal called "MyChart".  Sign up information is provided on this After Visit Summary.  MyChart is used to connect with patients for Virtual Visits (Telemedicine).  Patients are able to view lab/test results, encounter notes, upcoming appointments, etc.  Non-urgent messages can be sent to your provider as well.   To learn more about what you can do with MyChart, go to [email or web address].    Your next appointment:   1 year(s)  The format for your next appointment:   In Person  Provider:   Sinclair Grooms, MD     Other Instructions     Signed, Sinclair Grooms, MD  06/11/2021 9:40 AM    [place]

## 2021-06-11 ENCOUNTER — Ambulatory Visit (INDEPENDENT_AMBULATORY_CARE_PROVIDER_SITE_OTHER): Payer: Commercial Managed Care - PPO | Admitting: Interventional Cardiology

## 2021-06-11 ENCOUNTER — Other Ambulatory Visit: Payer: Self-pay

## 2021-06-11 ENCOUNTER — Encounter: Payer: Self-pay | Admitting: Interventional Cardiology

## 2021-06-11 VITALS — BP 132/78 | HR 75 | Ht 77.0 in | Wt 256.8 lb

## 2021-06-11 DIAGNOSIS — I10 Essential (primary) hypertension: Secondary | ICD-10-CM

## 2021-06-11 DIAGNOSIS — E1142 Type 2 diabetes mellitus with diabetic polyneuropathy: Secondary | ICD-10-CM | POA: Diagnosis not present

## 2021-06-11 DIAGNOSIS — E7849 Other hyperlipidemia: Secondary | ICD-10-CM | POA: Diagnosis not present

## 2021-06-11 DIAGNOSIS — I2510 Atherosclerotic heart disease of native coronary artery without angina pectoris: Secondary | ICD-10-CM | POA: Diagnosis not present

## 2021-06-11 NOTE — Patient Instructions (Signed)

## 2021-07-14 ENCOUNTER — Encounter: Payer: Self-pay | Admitting: Dermatology

## 2021-07-14 ENCOUNTER — Ambulatory Visit (INDEPENDENT_AMBULATORY_CARE_PROVIDER_SITE_OTHER): Payer: Commercial Managed Care - PPO | Admitting: Dermatology

## 2021-07-14 ENCOUNTER — Other Ambulatory Visit: Payer: Self-pay

## 2021-07-14 DIAGNOSIS — C44612 Basal cell carcinoma of skin of right upper limb, including shoulder: Secondary | ICD-10-CM

## 2021-07-14 DIAGNOSIS — L570 Actinic keratosis: Secondary | ICD-10-CM | POA: Diagnosis not present

## 2021-07-14 DIAGNOSIS — C4491 Basal cell carcinoma of skin, unspecified: Secondary | ICD-10-CM

## 2021-07-14 DIAGNOSIS — L720 Epidermal cyst: Secondary | ICD-10-CM | POA: Diagnosis not present

## 2021-07-14 DIAGNOSIS — C44619 Basal cell carcinoma of skin of left upper limb, including shoulder: Secondary | ICD-10-CM | POA: Diagnosis not present

## 2021-07-14 DIAGNOSIS — D485 Neoplasm of uncertain behavior of skin: Secondary | ICD-10-CM

## 2021-07-14 DIAGNOSIS — D0461 Carcinoma in situ of skin of right upper limb, including shoulder: Secondary | ICD-10-CM | POA: Diagnosis not present

## 2021-07-14 DIAGNOSIS — C4492 Squamous cell carcinoma of skin, unspecified: Secondary | ICD-10-CM

## 2021-07-14 HISTORY — DX: Squamous cell carcinoma of skin, unspecified: C44.92

## 2021-07-14 HISTORY — DX: Basal cell carcinoma of skin, unspecified: C44.91

## 2021-07-14 NOTE — Patient Instructions (Signed)

## 2021-07-15 ENCOUNTER — Encounter: Payer: Self-pay | Admitting: Dermatology

## 2021-07-20 ENCOUNTER — Other Ambulatory Visit: Payer: Self-pay

## 2021-07-20 ENCOUNTER — Ambulatory Visit (INDEPENDENT_AMBULATORY_CARE_PROVIDER_SITE_OTHER): Payer: Medicare Other | Admitting: Dermatology

## 2021-07-20 ENCOUNTER — Encounter: Payer: Self-pay | Admitting: Dermatology

## 2021-07-20 DIAGNOSIS — L0889 Other specified local infections of the skin and subcutaneous tissue: Secondary | ICD-10-CM

## 2021-07-20 DIAGNOSIS — S61401D Unspecified open wound of right hand, subsequent encounter: Secondary | ICD-10-CM

## 2021-07-20 DIAGNOSIS — L089 Local infection of the skin and subcutaneous tissue, unspecified: Secondary | ICD-10-CM

## 2021-07-20 MED ORDER — SULFAMETHOXAZOLE-TRIMETHOPRIM 800-160 MG PO TABS: 1.0000 | ORAL_TABLET | Freq: Two times a day (BID) | ORAL | 0 refills | Status: AC

## 2021-07-21 ENCOUNTER — Telehealth: Payer: Self-pay

## 2021-07-21 NOTE — Telephone Encounter (Signed)
-----   Message from Lavonna Monarch, MD sent at 07/21/2021  4:43 AM EST ----- Schedule surgery with Dr. Darene Lamer

## 2021-07-21 NOTE — Telephone Encounter (Signed)
Patient already has surgery visit in February

## 2021-07-21 NOTE — Telephone Encounter (Signed)
Patient is returning phone call.  °

## 2021-07-21 NOTE — Telephone Encounter (Signed)
Patient already has a surgery schedule made for February.

## 2021-07-28 DIAGNOSIS — M4724 Other spondylosis with radiculopathy, thoracic region: Secondary | ICD-10-CM | POA: Diagnosis not present

## 2021-08-06 NOTE — Progress Notes (Signed)
Follow-Up Visit   Subjective  Henry Rivera is a 66 y.o. male who presents for the following: Skin Problem (Pt states he has some Aks on B/L hands and some red spots on B/L forearms. Red spot on Right forearm causes discomfort. /Pt has 2 Cysts on the back  x1 yr he would like tx ).  General skin examination, several new and growing crusts on arms, also would like to discuss removal of cyst back and neck. Location:  Duration:  Quality:  Associated Signs/Symptoms: Modifying Factors:  Severity:  Timing: Context:   Objective  Well appearing patient in no apparent distress; mood and affect are within normal limits. Left Hand - Posterior (3), Right Hand - Posterior (6), Scalp (6) 3 To 4 mm hornlike pink crusts  Right Upper Arm - Anterior 1.2 cm pink waxy crust       Left Forearm - Anterior 9 Millimeter pink waxy crust       Mid Back, Neck - Posterior noninflamed 2 cm deep dermal nodules compatible with epidermoid cysts  Right Hand - Posterior 2 cm waxy pink crust with 2 millimeter eccentric horn         All skin waist up examined.   Assessment & Plan    AK (actinic keratosis) (15) Left Hand - Posterior (3); Right Hand - Posterior (6); Scalp (6)  Destruction of lesion - Left Hand - Posterior, Right Hand - Posterior, Scalp Complexity: simple   Destruction method: cryotherapy   Informed consent: discussed and consent obtained   Timeout:  patient name, date of birth, surgical site, and procedure verified Lesion destroyed using liquid nitrogen: Yes   Cryotherapy cycles:  3 Outcome: patient tolerated procedure well with no complications   Post-procedure details: wound care instructions given    Neoplasm of uncertain behavior of skin (2) Right Upper Arm - Anterior  Skin / nail biopsy Type of biopsy: tangential   Informed consent: discussed and consent obtained   Timeout: patient name, date of birth, surgical site, and procedure verified    Anesthesia: the lesion was anesthetized in a standard fashion   Anesthetic:  1% lidocaine w/ epinephrine 1-100,000 local infiltration Instrument used: flexible razor blade   Hemostasis achieved with: ferric subsulfate   Outcome: patient tolerated procedure well   Post-procedure details: wound care instructions given    Specimen 1 - Surgical pathology Differential Diagnosis: scc vs bcc  Check Margins: No  Left Forearm - Anterior  Skin / nail biopsy Type of biopsy: tangential   Informed consent: discussed and consent obtained   Timeout: patient name, date of birth, surgical site, and procedure verified   Anesthesia: the lesion was anesthetized in a standard fashion   Anesthetic:  1% lidocaine w/ epinephrine 1-100,000 local infiltration Instrument used: flexible razor blade   Hemostasis achieved with: ferric subsulfate   Outcome: patient tolerated procedure well   Post-procedure details: wound care instructions given    Specimen 3 - Surgical pathology Differential Diagnosis: scc vs bcc  Check Margins: No  Epidermal cyst (2) Neck - Posterior; Mid Back  Can schedule surgery if patient wants removed.  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  Skin / nail biopsy Type of biopsy: tangential   Informed consent: discussed and consent obtained   Timeout: patient name, date of birth, surgical site, and procedure verified   Anesthesia: the lesion was anesthetized in a standard  fashion   Anesthetic:  1% lidocaine w/ epinephrine 1-100,000 local infiltration Instrument used: flexible razor blade   Hemostasis achieved with: ferric subsulfate   Outcome: patient tolerated procedure well   Post-procedure details: wound care instructions given    Destruction of lesion Complexity: simple   Destruction method: electrodesiccation and curettage   Informed consent: discussed  and consent obtained   Timeout:  patient name, date of birth, surgical site, and procedure verified Anesthesia: the lesion was anesthetized in a standard fashion   Anesthetic:  1% lidocaine w/ epinephrine 1-100,000 local infiltration Curettage performed in three different directions: Yes   Curettage cycles:  1 Lesion length (cm):  1.2 Lesion width (cm):  1.2 Margin per side (cm):  0 Final wound size (cm):  1.2 Hemostasis achieved with:  aluminum chloride Outcome: patient tolerated procedure well with no complications   Post-procedure details: wound care instructions given    Specimen 2 - Surgical pathology Differential Diagnosis: scc vs bcc Txpbx Cautery only Check Margins: No  Shave biopsy the base was treated by curettage plus cautery final wound 1.2 cm      I, Lavonna Monarch, MD, have reviewed all documentation for this visit.  The documentation on 08/06/21 for the exam, diagnosis, procedures, and orders are all accurate and complete.

## 2021-08-08 DIAGNOSIS — M546 Pain in thoracic spine: Secondary | ICD-10-CM | POA: Diagnosis not present

## 2021-08-12 ENCOUNTER — Encounter: Payer: Self-pay | Admitting: Dermatology

## 2021-08-12 NOTE — Progress Notes (Signed)
° °  Follow-Up Visit   Subjective  Henry Rivera is a 66 y.o. male who presents for the following: Wound Check (Patient here today for wound check on right dorsal hand that was treated on last week. Patient states that the wound is painful and red. ).  Check for wound infection treatment site right hand Location:  Duration:  Quality:  Associated Signs/Symptoms: Modifying Factors:  Severity:  Timing: Context:   Objective  Well appearing patient in no apparent distress; mood and affect are within normal limits. Right Hand - Posterior Site of biopsy/treatment right dorsal and shows redness, swelling, some tenderness with 3 mm pink halo    A focused examination was performed including right hand and. Relevant physical exam findings are noted in the Assessment and Plan.   Assessment & Plan    Wound infection Right Hand - Posterior  Culture obtained, will begin therapy generic Bactrim DS (no known allergies).  Follow-up by MyChart or phone next week.  sulfamethoxazole-trimethoprim (BACTRIM DS) 800-160 MG tablet - Right Hand - Posterior Take 1 tablet by mouth 2 (two) times daily.      I, Lavonna Monarch, MD, have reviewed all documentation for this visit.  The documentation on 08/12/21 for the exam, diagnosis, procedures, and orders are all accurate and complete.

## 2021-08-14 ENCOUNTER — Other Ambulatory Visit: Payer: Self-pay | Admitting: Endocrinology

## 2021-08-14 DIAGNOSIS — M549 Dorsalgia, unspecified: Secondary | ICD-10-CM | POA: Diagnosis not present

## 2021-09-10 ENCOUNTER — Other Ambulatory Visit: Payer: Self-pay

## 2021-09-10 ENCOUNTER — Encounter: Payer: Self-pay | Admitting: Dermatology

## 2021-09-10 ENCOUNTER — Ambulatory Visit (INDEPENDENT_AMBULATORY_CARE_PROVIDER_SITE_OTHER): Payer: Commercial Managed Care - PPO | Admitting: Dermatology

## 2021-09-10 DIAGNOSIS — C44619 Basal cell carcinoma of skin of left upper limb, including shoulder: Secondary | ICD-10-CM

## 2021-09-10 DIAGNOSIS — D0461 Carcinoma in situ of skin of right upper limb, including shoulder: Secondary | ICD-10-CM | POA: Diagnosis not present

## 2021-09-10 DIAGNOSIS — C44612 Basal cell carcinoma of skin of right upper limb, including shoulder: Secondary | ICD-10-CM

## 2021-09-10 NOTE — Patient Instructions (Signed)

## 2021-09-24 ENCOUNTER — Other Ambulatory Visit: Payer: Self-pay

## 2021-09-24 ENCOUNTER — Other Ambulatory Visit (INDEPENDENT_AMBULATORY_CARE_PROVIDER_SITE_OTHER): Payer: Commercial Managed Care - PPO

## 2021-09-24 DIAGNOSIS — E669 Obesity, unspecified: Secondary | ICD-10-CM | POA: Diagnosis not present

## 2021-09-24 DIAGNOSIS — E1169 Type 2 diabetes mellitus with other specified complication: Secondary | ICD-10-CM | POA: Diagnosis not present

## 2021-09-24 LAB — BASIC METABOLIC PANEL
BUN: 23 mg/dL (ref 6–23)
CO2: 28 mEq/L (ref 19–32)
Calcium: 9.6 mg/dL (ref 8.4–10.5)
Chloride: 104 mEq/L (ref 96–112)
Creatinine, Ser: 1.38 mg/dL (ref 0.40–1.50)
GFR: 53.59 mL/min — ABNORMAL LOW (ref >60.00)
Glucose, Bld: 127 mg/dL — ABNORMAL HIGH (ref 70–99)
Potassium: 4.2 mEq/L (ref 3.5–5.1)
Sodium: 138 mEq/L (ref 135–145)

## 2021-09-24 LAB — HEMOGLOBIN A1C: Hgb A1c MFr Bld: 6.9 % — ABNORMAL HIGH (ref 4.6–6.5)

## 2021-09-27 ENCOUNTER — Other Ambulatory Visit: Payer: Self-pay | Admitting: Endocrinology

## 2021-09-28 ENCOUNTER — Encounter: Payer: Self-pay | Admitting: Dermatology

## 2021-09-28 NOTE — Progress Notes (Signed)
Follow-Up Visit   Subjective  Henry Rivera is a 66 y.o. male who presents for the following: Procedure (1. Skin , right upper arm- anterior/BASAL CELL CARCINOMA, NODULAR AND INFILTRATIVE PATTERNS, BASE INVOLVED/3. Skin , left forearm- anterior/BASAL CELL CARCINOMA, NODULAR PATTERN, BASE INVOLVED).  Here for treatment of biopsy-proven basal cell carcinomas Location:  Duration:  Quality:  Associated Signs/Symptoms: Modifying Factors:  Severity:  Timing: Context:   Objective  Well appearing patient in no apparent distress; mood and affect are within normal limits. Right Upper Arm - Anterior Lesion identified by Dr.Briahnna Harries and nurse in room.    Left Forearm - Anterior Lesion identified by Dr.Mashanda Ishibashi and nurse in room.    Right Dorsal Hand Lesion identified by Dr.Inna Tisdell and nurse in room.      All skin waist up examined.   Assessment & Plan    Basal cell carcinoma of skin of right upper limb, including shoulder Right Upper Arm - Anterior  Destruction of lesion Complexity: simple   Destruction method: electrodesiccation and curettage   Informed consent: discussed and consent obtained   Timeout:  patient name, date of birth, surgical site, and procedure verified Anesthesia: the lesion was anesthetized in a standard fashion   Anesthetic:  1% lidocaine w/ epinephrine 1-100,000 local infiltration Curettage performed in three different directions: Yes   Curettage cycles:  3 Lesion length (cm):  2 Lesion width (cm):  2 Margin per side (cm):  0 Final wound size (cm):  2 Hemostasis achieved with:  ferric subsulfate Outcome: patient tolerated procedure well with no complications   Post-procedure details: sterile dressing applied and wound care instructions given   Dressing type: bandage and petrolatum   Additional details:  Wound inoculated with 5% Fluorouracil.    Basal cell carcinoma (BCC) of left forearm Left Forearm - Anterior  Destruction of  lesion Complexity: simple   Destruction method: electrodesiccation and curettage   Informed consent: discussed and consent obtained   Timeout:  patient name, date of birth, surgical site, and procedure verified Anesthesia: the lesion was anesthetized in a standard fashion   Anesthetic:  1% lidocaine w/ epinephrine 1-100,000 local infiltration Curettage performed in three different directions: Yes   Curettage cycles:  3 Lesion length (cm):  1.5 Lesion width (cm):  1.5 Margin per side (cm):  0 Final wound size (cm):  1.5 Hemostasis achieved with:  ferric subsulfate Outcome: patient tolerated procedure well with no complications   Post-procedure details: sterile dressing applied and wound care instructions given   Dressing type: bandage and petrolatum   Additional details:  Wound inoculated with 5% Fluorouracil.    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  Destruction of lesion Complexity: simple   Destruction method: electrodesiccation and curettage   Informed consent: discussed and consent obtained   Timeout:  patient name, date of birth, surgical site, and procedure verified Anesthesia: the lesion was anesthetized in a standard fashion   Anesthetic:  1% lidocaine w/ epinephrine 1-100,000 local infiltration Curettage performed in three different directions: Yes   Curettage cycles:  2 Lesion length (cm):  2 Lesion width (cm):  2 Margin per side (cm):  0 Final wound size (cm):  2 Hemostasis achieved with:  ferric subsulfate Outcome: patient tolerated procedure well with no complications   Post-procedure details: sterile dressing applied and wound care instructions given   Dressing type: bandage and petrolatum   Additional details:  Wound inoculated with 5% Fluorouracil.  I, Lavonna Monarch, MD, have reviewed all documentation for this visit.  The documentation on 09/28/21 for the exam, diagnosis, procedures, and orders are all  accurate and complete.

## 2021-09-29 ENCOUNTER — Other Ambulatory Visit: Payer: Medicare Other

## 2021-09-30 DIAGNOSIS — J019 Acute sinusitis, unspecified: Secondary | ICD-10-CM | POA: Diagnosis not present

## 2021-09-30 DIAGNOSIS — J209 Acute bronchitis, unspecified: Secondary | ICD-10-CM | POA: Diagnosis not present

## 2021-09-30 DIAGNOSIS — R059 Cough, unspecified: Secondary | ICD-10-CM | POA: Diagnosis not present

## 2021-10-01 ENCOUNTER — Ambulatory Visit: Payer: Medicare Other | Admitting: Endocrinology

## 2021-10-09 ENCOUNTER — Ambulatory Visit (INDEPENDENT_AMBULATORY_CARE_PROVIDER_SITE_OTHER): Payer: Commercial Managed Care - PPO | Admitting: Endocrinology

## 2021-10-09 ENCOUNTER — Encounter: Payer: Self-pay | Admitting: Endocrinology

## 2021-10-09 ENCOUNTER — Other Ambulatory Visit: Payer: Self-pay

## 2021-10-09 VITALS — BP 122/72 | HR 78 | Ht 77.0 in | Wt 262.0 lb

## 2021-10-09 DIAGNOSIS — N289 Disorder of kidney and ureter, unspecified: Secondary | ICD-10-CM

## 2021-10-09 DIAGNOSIS — I10 Essential (primary) hypertension: Secondary | ICD-10-CM | POA: Diagnosis not present

## 2021-10-09 DIAGNOSIS — E1165 Type 2 diabetes mellitus with hyperglycemia: Secondary | ICD-10-CM

## 2021-10-09 NOTE — Progress Notes (Signed)
Patient ID: Henry Rivera, male   DOB: 05-30-1956, 66 y.o.   MRN: [id number]            Reason for Appointment: Follow-up visit   History of Present Illness:          Date of diagnosis of type 2 diabetes mellitus: 2007      Background history:   His A1c was apparently 12.7 at the time of diagnosis when he had increased thirst and urination He was started on Janumet and apparently continued unchanged until last year, this was stopped because of high out-of-pocket expense He was subsequently on metformin only and then Jardiance 10 mg started His A1c in 2017 was 6.7 twice, previous records not available.  Recent history:       Non-insulin hypoglycemic drugs the patient is taking are: Farxiga 10 mg,  Trulicity 0.08 mg weekly, metformin 1 g twice a day   His A1c is 6.9  Current management, blood sugar patterns and problems identified:  He has had slightly higher blood sugars and some weight gain  However recent blood sugars have been reasonably good with highest reading only 162  He has been able to walk fairly consistently although still has had intermittent issues with other medical problems such as viral respiratory infections  More recently has been able to walk 5 miles a day Fasting readings are mildly increased overall but relatively better than before He is generally able to watch his diet except sometimes when eating out He is usually able to to check readings fasting and 2 hours after dinner but checking mostly in the mornings       Side effects from medications have been: Nausea from high-dose Trulicity  Compliance with the medical regimen: Usually good  Glucose monitoring:  done 1-2 times a day         Glucometer: One Touch   .       PRE-MEAL Fasting Lunch Dinner Bedtime Overall  Glucose range: 110-140    80-162  Mean/median: 128    128    POST-MEAL PC Breakfast PC Lunch PC Dinner  Glucose range:   86-1 62  Mean/median:   128    Previous Glucose data  for the last 30 days shows overall range 87-175  FASTING average 142 The after dinner average 126   Self-care: The diet that the patient has been following is: tries to limit high-fat foods .                  Dietician visit, most recent: none, previously attended classes at cardiac rehabilitation               Weight history: Maximum weight had been 318  Wt Readings from Last 3 Encounters:  10/09/21 262 lb (118.8 kg)  06/11/21 256 lb 12.8 oz (116.5 kg)  05/28/21 251 lb 6.4 oz (114 kg)    Glycemic control:    Lab Results  Component Value Date   HGBA1C 6.9 (H) 09/24/2021   HGBA1C 6.6 (H) 05/26/2021   HGBA1C 6.9 (H) 12/16/2020   Lab Results  Component Value Date   MICROALBUR <0.7 08/06/2020   [place] 22 08/25/2018   CREATININE 1.38 09/24/2021   Lab Results  Component Value Date   MICRALBCREAT 0.7 08/06/2020     Other active problems: See review of systems   Allergies as of 10/09/2021       Reactions   Augmentin [amoxicillin-pot Clavulanate]    Gi sx  Fluogen [influenza Virus Vaccine]    Other reaction(s): Unknown        Medication List        Accurate as of October 09, 2021 10:11 AM. If you have any questions, ask your nurse or doctor.          aspirin 81 MG tablet Take 81 mg by mouth daily.   CINNAMON PO Take 1,000 capsules by mouth daily.   esomeprazole 40 MG capsule Commonly known as: NEXIUM Take 40 mg by mouth daily at 12 noon.   Farxiga 10 MG Tabs tablet Generic drug: dapagliflozin propanediol TAKE 1 TABLET BY MOUTH EVERY DAY   fexofenadine 180 MG tablet Commonly known as: ALLEGRA Take 180 mg by mouth daily.   fluticasone 50 MCG/ACT nasal spray Commonly known as: FLONASE Place 2 sprays into both nostrils as needed for allergies or rhinitis.   glucose blood test strip Use as instructed to check blood sugar 1 time daily as needed.   OneTouch Verio test strip Generic drug: glucose blood CHECK SUGAR 2 TIMES A DAY.   metFORMIN  1000 MG tablet Commonly known as: GLUCOPHAGE Take 1,000 mg by mouth 2 (two) times daily with a meal.   naproxen 500 MG tablet Commonly known as: NAPROSYN Take 500 mg by mouth as needed.   nitroGLYCERIN 0.4 MG SL tablet Commonly known as: NITROSTAT Place 1 tablet (0.4 mg total) under the tongue every 5 (five) minutes as needed for chest pain.   OneTouch Verio Flex System w/Device Kit Use to check blood sugar 2 times per day   rosuvastatin 5 MG tablet Commonly known as: CRESTOR   Trulicity 2.63 ZC/5.8IF Sopn Generic drug: Dulaglutide INJECT 0.75 MG INTO THE SKIN ONCE A WEEK.   valsartan 320 MG tablet Commonly known as: DIOVAN Take 320 mg by mouth daily.   VITAMIN D PO Take by mouth. 1000 units once daily        Allergies:  Allergies  Allergen Reactions   Augmentin [Amoxicillin-Pot Clavulanate]     Gi sx   Fluogen [Influenza Virus Vaccine]     Other reaction(s): Unknown    Past Medical History:  Diagnosis Date   Allergic rhinitis    Allergy    Basal cell carcinoma 03/19/2015   L BACK, SUPERFICIAL, CX3, CAUTERY, 5FU   Basal cell carcinoma (BCC) 07/14/2021   R Upper Arm - CX3, 5FU   Basal cell carcinoma (BCC) 07/14/2021   L Forearm - CX3, 5FU   CAD (coronary artery disease) 11/2010   WITH LAD DES    Cataract    forming- small    Diabetes ([place]) 2007   Elevated cholesterol    pt denies this 11-11-15- states his cholesterol is fine    GERD (gastroesophageal reflux disease)    Hyperlipidemia    pt states uses Crestor due to Stent - not HLD    Hypertension    MI (mitral incompetence)    SCCA (squamous cell carcinoma) of skin 07/14/2021   Right Hand - posterior (in situ)   Squamous cell carcinoma of skin 10/27/2009   SCC A/KA, L ELBOW, EXC   Vitamin D deficiency     Past Surgical History:  Procedure Laterality Date   COLONOSCOPY  03-27-2004   2017- #2   CORONARY ANGIOPLASTY WITH STENT PLACEMENT  11-2010   POLYPECTOMY     VASECTOMY      Family History   Problem Relation Age of Onset   Hepatitis Brother    Diabetes Paternal [employer]  Heart failure Paternal Grandfather        CHF   Colon cancer Neg Hx    Colon polyps Neg Hx    Esophageal cancer Neg Hx    Rectal cancer Neg Hx    Stomach cancer Neg Hx     Social History:  reports that he has never smoked. He has never used smokeless tobacco. He reports that he does not drink alcohol and does not use drugs.   Review of Systems   Lipid history:  Baseline cholesterol has been normal, on Crestor for history of CAD, only on 5 mg every other day as prescribed by PCP  Followed by PCP, last LDL was 34 done in 3/22 and total cholesterol 89 in 9/22     Lab Results  Component Value Date   CHOL 75 08/25/2018   HDL 36.80 (L) 08/25/2018   LDLCALC 22 08/25/2018   TRIG 79.0 08/25/2018   CHOLHDL 2 08/25/2018           Hypertension: Treated by PCP with valsartan 320 mg  He has had long-standing hypertension and is followed by PCP/cardiology  BP Readings from Last 3 Encounters:  10/09/21 122/72  06/11/21 132/78  05/28/21 120/76   Renal function: This has been variable  Tends to have high normal potassium, he is taking valsartan  Lab Results  Component Value Date   K 4.2 09/24/2021     Lab Results  Component Value Date   CREATININE 1.38 09/24/2021   CREATININE 1.20 05/26/2021   CREATININE 1.26 12/16/2020    Has had foot exam with podiatrist and PCP annually  NEUROPATHY: He has mild numbness/decrease in monofilament sensation in his toes for some time    LABS:  No visits with results within 1 Week(s) from this visit.  Latest known visit with results is:  Lab on 09/24/2021  Component Date Value Ref Range Status   Sodium 09/24/2021 138  135 - 145 mEq/L Final   Potassium 09/24/2021 4.2  3.5 - 5.1 mEq/L Final   Chloride 09/24/2021 104  96 - 112 mEq/L Final   CO2 09/24/2021 28  19 - 32 mEq/L Final   Glucose, Bld 09/24/2021 127 (H)  70 - 99 mg/dL Final   BUN  09/24/2021 23  6 - 23 mg/dL Final   Creatinine, Ser 09/24/2021 1.38  0.40 - 1.50 mg/dL Final   GFR 09/24/2021 53.59 (L)  >60.00 mL/min Final   Calculated using the CKD-EPI Creatinine Equation (2021)   Calcium 09/24/2021 9.6  8.4 - 10.5 mg/dL Final   Hgb A1c MFr Bld 09/24/2021 6.9 (H)  4.6 - 6.5 % Final   Glycemic Control Guidelines for People with Diabetes:Non Diabetic:  <6%Goal of Therapy: <7%Additional Action Suggested:  >8%     Physical Examination:  BP 122/72    Pulse 78    Ht 6' 5" (1.956 m)    Wt 262 lb (118.8 kg)    SpO2 99%    BMI 31.07 kg/m    ASSESSMENT:  Diabetes type 2, non-insulin-dependent  Current regimen isTrulicity 5.85, Farxiga 10 mg and Metformin 2 g  See history of present illness for detailed discussion of current diabetes management, blood sugar patterns and problems identified  His A1c is slightly higher than before at 6.9  His A1c may be slightly higher with inconsistent exercise or diet over the last 3 months Also recently has had some difficulty losing weight Blood sugars at home are generally under good level except relatively higher fasting glucose but  usually well controlled after dinner  Again discussed the cost of Trulicity in the donut hole  HYPERTENSION: Blood pressure well controlled on Diovan  Mild renal dysfunction: Creatinine is slightly high possibly from recent use of Advil  Hyperkalemia: Resolved   PLAN:    No change in medications Discussed checking more readings after dinner He will call if he starts having any higher readings and may consider switching to [employer] if blood sugars are not controlled Reminded him to keep checking blood pressure regularly at home also    Patient Instructions  Check blood sugars on waking up 3 days a week  Also check blood sugars about 2 hours after meals and do this after different meals by rotation  Recommended blood sugar levels on waking up are 90-130 and about 2 hours after meal is  130-160  Please bring your blood sugar monitor to each visit, thank you      Elayne Snare 10/09/2021, 10:11 AM   Note: This office note was prepared with Dragon voice recognition system technology. Any transcriptional errors that result from this process are unintentional.

## 2021-10-09 NOTE — Patient Instructions (Signed)
Check blood sugars on waking up 3 days a week  Also check blood sugars about 2 hours after meals and do this after different meals by rotation  Recommended blood sugar levels on waking up are 90-130 and about 2 hours after meal is 130-160  Please bring your blood sugar monitor to each visit, thank you   

## 2021-10-12 DIAGNOSIS — R0602 Shortness of breath: Secondary | ICD-10-CM | POA: Diagnosis not present

## 2021-10-12 DIAGNOSIS — J209 Acute bronchitis, unspecified: Secondary | ICD-10-CM | POA: Diagnosis not present

## 2021-11-13 DIAGNOSIS — E785 Hyperlipidemia, unspecified: Secondary | ICD-10-CM | POA: Diagnosis not present

## 2021-11-13 DIAGNOSIS — Z7982 Long term (current) use of aspirin: Secondary | ICD-10-CM | POA: Diagnosis not present

## 2021-11-13 DIAGNOSIS — I129 Hypertensive chronic kidney disease with stage 1 through stage 4 chronic kidney disease, or unspecified chronic kidney disease: Secondary | ICD-10-CM | POA: Diagnosis not present

## 2021-11-13 DIAGNOSIS — K219 Gastro-esophageal reflux disease without esophagitis: Secondary | ICD-10-CM | POA: Diagnosis not present

## 2021-11-13 DIAGNOSIS — N1831 Chronic kidney disease, stage 3a: Secondary | ICD-10-CM | POA: Diagnosis not present

## 2021-11-13 DIAGNOSIS — E559 Vitamin D deficiency, unspecified: Secondary | ICD-10-CM | POA: Diagnosis not present

## 2021-11-13 DIAGNOSIS — Z0000 Encounter for general adult medical examination without abnormal findings: Secondary | ICD-10-CM | POA: Diagnosis not present

## 2021-11-13 DIAGNOSIS — Z955 Presence of coronary angioplasty implant and graft: Secondary | ICD-10-CM | POA: Diagnosis not present

## 2021-11-13 DIAGNOSIS — E1122 Type 2 diabetes mellitus with diabetic chronic kidney disease: Secondary | ICD-10-CM | POA: Diagnosis not present

## 2021-11-25 ENCOUNTER — Ambulatory Visit: Payer: Commercial Managed Care - PPO | Admitting: Dermatology

## 2021-11-30 ENCOUNTER — Ambulatory Visit: Payer: Medicare Other | Admitting: Dermatology

## 2021-11-30 ENCOUNTER — Encounter: Payer: Self-pay | Admitting: Dermatology

## 2021-11-30 DIAGNOSIS — L570 Actinic keratosis: Secondary | ICD-10-CM

## 2021-11-30 DIAGNOSIS — Z85828 Personal history of other malignant neoplasm of skin: Secondary | ICD-10-CM

## 2021-11-30 MED ORDER — IMIQUIMOD 5 % EX CREA: TOPICAL_CREAM | CUTANEOUS | 0 refills | Status: DC

## 2021-11-30 NOTE — Progress Notes (Signed)
? ?  Follow-Up Visit ?  ?Subjective  ?Henry Rivera is a 66 y.o. male who presents for the following: Follow-up (F/u- right upper arm-antterior, left forearm anterior & right dorsal hand- bcc-treated last office visit- no concerns healing good). ? ? recheck sites of skin cancer plus other crusts ?Location:  ?Duration:  ?Quality:  ?Associated Signs/Symptoms: ?Modifying Factors:  ?Severity:  ?Timing: ?Context:  ? ?Objective  ?Well appearing patient in no apparent distress; mood and affect are within normal limits. ?Left Forearm - Posterior, Right Forearm - Posterior ?Half dozen nonhypertrophic actinic keratoses scattered on arms ? ?Left Elbow - Posterior ?No sign residual skin cancer on arms.  Still some scale on dorsal right hand. ? ? ? ?A focused examination was performed including head, neck, arms, hands. Relevant physical exam findings are noted in the Assessment and Plan. ? ? ?Assessment & Plan  ? ? ?AK (actinic keratosis) (2) ?Left Forearm - Posterior; Right Forearm - Posterior ? ?Imiquimod 3 times weekly with a goal of 6 to 8 weeks. ? ?Related Medications ?imiquimod (ALDARA) 5 % cream ?Apply to arms nightly Mon. Wed. Friday ? ?Personal history of skin cancer ?Left Elbow - Posterior ? ?Will use Aldara on right hand, Monday plus Wednesday plus Friday for 6 to 8 weeks or until he sees a brisk local reaction.  He knows he can contact me if there are any issues. ? ? ? ? ? ?I, Lavonna Monarch, MD, have reviewed all documentation for this visit.  The documentation on 11/30/21 for the exam, diagnosis, procedures, and orders are all accurate and complete. ?

## 2021-12-20 DIAGNOSIS — M542 Cervicalgia: Secondary | ICD-10-CM | POA: Diagnosis not present

## 2021-12-25 ENCOUNTER — Other Ambulatory Visit: Payer: Self-pay | Admitting: Endocrinology

## 2021-12-29 ENCOUNTER — Telehealth: Payer: Self-pay

## 2021-12-29 NOTE — Telephone Encounter (Signed)
Patient came in states that his trulicity medication has increase to a couple hundred of dollars. Patient is on his last injection wants to know if there is another drug that he can be put on.

## 2022-01-06 ENCOUNTER — Other Ambulatory Visit: Payer: Self-pay | Admitting: Endocrinology

## 2022-01-18 DIAGNOSIS — H179 Unspecified corneal scar and opacity: Secondary | ICD-10-CM | POA: Diagnosis not present

## 2022-01-18 DIAGNOSIS — H524 Presbyopia: Secondary | ICD-10-CM | POA: Diagnosis not present

## 2022-01-18 DIAGNOSIS — E119 Type 2 diabetes mellitus without complications: Secondary | ICD-10-CM | POA: Diagnosis not present

## 2022-01-18 DIAGNOSIS — H25813 Combined forms of age-related cataract, bilateral: Secondary | ICD-10-CM | POA: Diagnosis not present

## 2022-01-18 DIAGNOSIS — H02831 Dermatochalasis of right upper eyelid: Secondary | ICD-10-CM | POA: Diagnosis not present

## 2022-01-22 NOTE — Telephone Encounter (Signed)
I called Nov[employer] Nordisk to check on the application and it was denied due to patient income. Patient would like to know if you can recommend a different drug that is not so expensive.

## 2022-02-05 ENCOUNTER — Other Ambulatory Visit: Payer: Self-pay | Admitting: Endocrinology

## 2022-02-09 ENCOUNTER — Other Ambulatory Visit (INDEPENDENT_AMBULATORY_CARE_PROVIDER_SITE_OTHER): Payer: Medicare Other

## 2022-02-09 DIAGNOSIS — E1165 Type 2 diabetes mellitus with hyperglycemia: Secondary | ICD-10-CM

## 2022-02-09 LAB — BASIC METABOLIC PANEL
BUN: 23 mg/dL (ref 6–23)
CO2: 22 mEq/L (ref 19–32)
Calcium: 9.5 mg/dL (ref 8.4–10.5)
Chloride: 103 mEq/L (ref 96–112)
Creatinine, Ser: 1.45 mg/dL (ref 0.40–1.50)
GFR: 50.37 mL/min — ABNORMAL LOW (ref >60.00)
Glucose, Bld: 159 mg/dL — ABNORMAL HIGH (ref 70–99)
Potassium: 3.9 mEq/L (ref 3.5–5.1)
Sodium: 135 mEq/L (ref 135–145)

## 2022-02-09 LAB — MICROALBUMIN / CREATININE URINE RATIO
Creatinine,U: 142.1 mg/dL
Microalb Creat Ratio: 1 mg/g (ref 0.0–30.0)
Microalb, Ur: 1.4 mg/dL (ref 0.0–1.9)

## 2022-02-09 LAB — HEMOGLOBIN A1C: Hgb A1c MFr Bld: 7 % — ABNORMAL HIGH (ref 4.6–6.5)

## 2022-02-12 ENCOUNTER — Encounter: Payer: Self-pay | Admitting: Endocrinology

## 2022-02-12 ENCOUNTER — Ambulatory Visit: Payer: Medicare Other | Admitting: Endocrinology

## 2022-02-12 VITALS — BP 114/68 | HR 77 | Ht 76.5 in | Wt 261.2 lb

## 2022-02-12 DIAGNOSIS — E1165 Type 2 diabetes mellitus with hyperglycemia: Secondary | ICD-10-CM | POA: Diagnosis not present

## 2022-02-12 DIAGNOSIS — N289 Disorder of kidney and ureter, unspecified: Secondary | ICD-10-CM

## 2022-02-12 DIAGNOSIS — I10 Essential (primary) hypertension: Secondary | ICD-10-CM

## 2022-02-12 MED ORDER — GLIMEPIRIDE 1 MG PO TABS: 1.0000 mg | ORAL_TABLET | Freq: Every day | ORAL | 3 refills | Status: DC

## 2022-02-12 NOTE — Patient Instructions (Addendum)
Amaryl 1/2 at dinner for 1 week then 1 daily if needed  Take only 1/2 VALSARTAN daily and call if high

## 2022-02-12 NOTE — Progress Notes (Unsigned)
Patient ID: Henry Rivera, male   DOB: Apr 12, 1956, 66 y.o.   MRN: [id number]            Reason for Appointment: Follow-up visit   History of Present Illness:          Date of diagnosis of type 2 diabetes mellitus: 2007      Background history:   His A1c was apparently 12.7 at the time of diagnosis when he had increased thirst and urination He was started on Janumet and apparently continued unchanged until last year, this was stopped because of high out-of-pocket expense He was subsequently on metformin only and then Jardiance 10 mg started His A1c in 2017 was 6.7 twice, previous records not available.  Recent history:       Non-insulin hypoglycemic drugs the patient is taking are: Farxiga 10 mg,  Trulicity 3.30 mg weekly, metformin 1 g twice a day   His A1c is 7 compared to 6.9  Current management, blood sugar patterns and problems identified:  He has not been able to afford his Trulicity because of cost and has not taken it since last month  With this he has slightly higher blood sugars especially fasting  However has not had any weight gain or increased appetite with starting Trulicity His recent blood sugars have been averaging about 140 compared to 128 on the last visit and highest reading 194  good with highest reading only 162  He has been able to walk fairly consistently despite some foot issues, generally at least 10,000 steps a day No side effects with metformin He is generally able to watch his diet most of the time He is still able to to check readings fasting and 2 hours after dinner regularly       Side effects from medications have been: Nausea from high-dose Trulicity  Compliance with the medical regimen: Usually good  Glucose monitoring:  done 1-2 times a day         Glucometer: One Touch   .      FASTING range from 110-159 with AVERAGE 138 Late evening average 137, range 92-194  Overall AVERAGE 136  Previously: PRE-MEAL Fasting Lunch Dinner  Bedtime Overall  Glucose range: 110-140    80-162  Mean/median: 128    128    POST-MEAL PC Breakfast PC Lunch PC Dinner  Glucose range:   86-1 62  Mean/median:   128    Self-care: The diet that the patient has been following is: tries to limit high-fat foods .                  Dietician visit, most recent: none, previously attended classes at cardiac rehabilitation               Weight history: Maximum weight had been 318  Wt Readings from Last 3 Encounters:  02/12/22 261 lb 3.2 oz (118.5 kg)  10/09/21 262 lb (118.8 kg)  06/11/21 256 lb 12.8 oz (116.5 kg)    Glycemic control:    Lab Results  Component Value Date   HGBA1C 7.0 (H) 02/09/2022   HGBA1C 6.9 (H) 09/24/2021   HGBA1C 6.6 (H) 05/26/2021   Lab Results  Component Value Date   MICROALBUR 1.4 02/09/2022   [place] 22 08/25/2018   CREATININE 1.45 02/09/2022   Lab Results  Component Value Date   MICRALBCREAT 1.0 02/09/2022     Other active problems: See review of systems   Allergies as of 02/12/2022  Reactions   Augmentin [amoxicillin-pot Clavulanate]    Gi sx   Fluogen [influenza Virus Vaccine]    Other reaction(s): Unknown        Medication List        Accurate as of February 12, 2022 11:59 PM. If you have any questions, ask your nurse or doctor.          aspirin 81 MG tablet Take 81 mg by mouth daily.   CINNAMON PO Take 1,000 capsules by mouth daily.   esomeprazole 40 MG capsule Commonly known as: NEXIUM Take 40 mg by mouth daily at 12 noon.   Farxiga 10 MG Tabs tablet Generic drug: dapagliflozin propanediol TAKE 1 TABLET BY MOUTH EVERY DAY   fexofenadine 180 MG tablet Commonly known as: ALLEGRA Take 180 mg by mouth daily.   fluticasone 50 MCG/ACT nasal spray Commonly known as: FLONASE Place 2 sprays into both nostrils as needed for allergies or rhinitis.   glimepiride 1 MG tablet Commonly known as: AMARYL Take 1 tablet (1 mg total) by mouth daily with breakfast. Started  by: Elayne Snare, MD   glucose blood test strip Use as instructed to check blood sugar 1 time daily as needed.   OneTouch Verio test strip Generic drug: glucose blood CHECK SUGAR 2 TIMES A DAY.   imiquimod 5 % cream Commonly known as: Aldara Apply to arms nightly Mon. Wed. Friday   metFORMIN 1000 MG tablet Commonly known as: GLUCOPHAGE Take 1,000 mg by mouth 2 (two) times daily with a meal.   naproxen 500 MG tablet Commonly known as: NAPROSYN Take 500 mg by mouth as needed.   nitroGLYCERIN 0.4 MG SL tablet Commonly known as: NITROSTAT Place 1 tablet (0.4 mg total) under the tongue every 5 (five) minutes as needed for chest pain.   OneTouch Verio Flex System w/Device Kit Use to check blood sugar 2 times per day   rosuvastatin 5 MG tablet Commonly known as: CRESTOR   tamsulosin 0.4 MG Caps capsule Commonly known as: FLOMAX 1 capsule   Trulicity 9.67 EL/3.8BO Sopn Generic drug: Dulaglutide INJECT 0.75 MG INTO THE SKIN ONCE A WEEK.   valsartan 320 MG tablet Commonly known as: DIOVAN Take 320 mg by mouth daily.   VITAMIN D PO Take by mouth. 1000 units once daily        Allergies:  Allergies  Allergen Reactions   Augmentin [Amoxicillin-Pot Clavulanate]     Gi sx   Fluogen [Influenza Virus Vaccine]     Other reaction(s): Unknown    Past Medical History:  Diagnosis Date   Allergic rhinitis    Allergy    Basal cell carcinoma 03/19/2015   L BACK, SUPERFICIAL, CX3, CAUTERY, 5FU   Basal cell carcinoma (BCC) 07/14/2021   R Upper Arm - CX3, 5FU   Basal cell carcinoma (BCC) 07/14/2021   L Forearm - CX3, 5FU   CAD (coronary artery disease) 11/2010   WITH LAD DES    Cataract    forming- small    Diabetes ([place]) 2007   Elevated cholesterol    pt denies this 11-11-15- states his cholesterol is fine    GERD (gastroesophageal reflux disease)    Hyperlipidemia    pt states uses Crestor due to Stent - not HLD    Hypertension    MI (mitral incompetence)    SCCA  (squamous cell carcinoma) of skin 07/14/2021   Right Hand - posterior (in situ)   Squamous cell carcinoma of skin 10/27/2009   SCC A/KA,  L ELBOW, EXC   Vitamin D deficiency     Past Surgical History:  Procedure Laterality Date   COLONOSCOPY  03-27-2004   2017- #2   CORONARY ANGIOPLASTY WITH STENT PLACEMENT  11-2010   POLYPECTOMY     VASECTOMY      Family History  Problem Relation Age of Onset   Hepatitis Brother    Diabetes Paternal Grandfather    Heart failure Paternal Grandfather        CHF   Colon cancer Neg Hx    Colon polyps Neg Hx    Esophageal cancer Neg Hx    Rectal cancer Neg Hx    Stomach cancer Neg Hx     Social History:  reports that he has never smoked. He has never used smokeless tobacco. He reports that he does not drink alcohol and does not use drugs.   Review of Systems   Lipid history:  Baseline cholesterol has been normal, on Crestor for history of CAD, only on 5 mg every other day as prescribed by PCP  Followed by PCP, last LDL was 34 done in 3/22 and total cholesterol 89 in 9/22     Lab Results  Component Value Date   CHOL 75 08/25/2018   HDL 36.80 (L) 08/25/2018   LDLCALC 22 08/25/2018   TRIG 79.0 08/25/2018   CHOLHDL 2 08/25/2018           Hypertension: Treated by PCP with valsartan 320 mg  He has had long-standing hypertension and is followed by PCP/cardiology  BP Readings from Last 3 Encounters:  02/12/22 114/68  10/09/21 122/72  06/11/21 132/78     Tends to have high normal potassium, he is taking valsartan 320 mg  Lab Results  Component Value Date   K 3.9 02/09/2022   Renal function: This has been variable, currently not taking any ibuprofen His creatinine is appearing progressively higher Has not had any microalbuminuria  Lab Results  Component Value Date   CREATININE 1.45 02/09/2022   CREATININE 1.38 09/24/2021   CREATININE 1.20 05/26/2021    Has had foot exam with podiatrist and PCP annually  NEUROPATHY: He  has mild numbness/decrease in monofilament sensation in his toes for some time    LABS:  Lab on 02/09/2022  Component Date Value Ref Range Status   Microalb, Ur 02/09/2022 1.4  0.0 - 1.9 mg/dL Final   Creatinine,U 02/09/2022 142.1  mg/dL Final   Microalb Creat Ratio 02/09/2022 1.0  0.0 - 30.0 mg/g Final   Sodium 02/09/2022 135  135 - 145 mEq/L Final   Potassium 02/09/2022 3.9  3.5 - 5.1 mEq/L Final   Chloride 02/09/2022 103  96 - 112 mEq/L Final   CO2 02/09/2022 22  19 - 32 mEq/L Final   Glucose, Bld 02/09/2022 159 (H)  70 - 99 mg/dL Final   BUN 02/09/2022 23  6 - 23 mg/dL Final   Creatinine, Ser 02/09/2022 1.45  0.40 - 1.50 mg/dL Final   GFR 02/09/2022 50.37 (L)  >60.00 mL/min Final   Calculated using the CKD-EPI Creatinine Equation (2021)   Calcium 02/09/2022 9.5  8.4 - 10.5 mg/dL Final   Hgb A1c MFr Bld 02/09/2022 7.0 (H)  4.6 - 6.5 % Final   Glycemic Control Guidelines for People with Diabetes:Non Diabetic:  <6%Goal of Therapy: <7%Additional Action Suggested:  >8%     Physical Examination:  BP 114/68   Pulse 77   Ht 6' 4.5" (1.943 m)   Wt 261 lb 3.2 oz (  118.5 kg)   SpO2 96%   BMI 31.38 kg/m   Diabetic Foot Exam - Simple   Simple Foot Form Diabetic Foot exam was performed with the following findings: Yes   Visual Inspection See comments: Yes Sensation Testing See comments: Yes Pulse Check Posterior Tibialis and Dorsalis pulse intact bilaterally: Yes Comments Hammertoe deformity present Slight decrease in monofilament sensation in the toes especially plantar surfaces     ASSESSMENT:  Diabetes type 2, non-insulin-dependent  Current regimen is Farxiga 10 mg and Metformin 2 g  See history of present illness for detailed discussion of current diabetes management, blood sugar patterns and problems identified  His A1c is slightly higher than before at 6.9  His A1c may be slightly higher with inconsistent exercise or diet over the last 3 months Also recently  has had some difficulty losing weight Blood sugars at home are generally under good level except relatively higher fasting glucose but usually well controlled after dinner  Again discussed the cost of Trulicity in the donut hole  HYPERTENSION: Blood pressure relatively low along with mild increase in creatinine on Diovan 320 mg  Mild renal dysfunction: Creatinine is slightly high possibly from recent use of Advil  Hyperkalemia: Resolved   PLAN:    He will hold off on resuming Trulicity till he can afford it, not clear if he can get on the patient assistance program Trial of AMARYL 0.5 mg daily at dinnertime After 1 week if morning sugars are still not below 130 he will go to the 1 mg dose  VALSARTAN to be reduced to half a tablet daily  Reminded him to keep checking blood pressure regularly at home and will follow-up with his PCP for repeat renal functions    Patient Instructions  Amaryl 1/2 at dinner for 1 week then 1 daily if needed  Take only 1/2 VALSARTAN daily and call if high      Elayne Snare 02/13/2022, 5:19 PM   Note: This office note was prepared with Dragon voice recognition system technology. Any transcriptional errors that result from this process are unintentional.

## 2022-03-18 DIAGNOSIS — L720 Epidermal cyst: Secondary | ICD-10-CM | POA: Diagnosis not present

## 2022-03-31 DIAGNOSIS — L720 Epidermal cyst: Secondary | ICD-10-CM | POA: Diagnosis not present

## 2022-04-13 DIAGNOSIS — L720 Epidermal cyst: Secondary | ICD-10-CM | POA: Diagnosis not present

## 2022-04-26 ENCOUNTER — Other Ambulatory Visit: Payer: Self-pay | Admitting: Endocrinology

## 2022-05-03 DIAGNOSIS — L720 Epidermal cyst: Secondary | ICD-10-CM | POA: Diagnosis not present

## 2022-05-05 DIAGNOSIS — E785 Hyperlipidemia, unspecified: Secondary | ICD-10-CM | POA: Diagnosis not present

## 2022-05-05 DIAGNOSIS — E1122 Type 2 diabetes mellitus with diabetic chronic kidney disease: Secondary | ICD-10-CM | POA: Diagnosis not present

## 2022-05-05 DIAGNOSIS — N1831 Chronic kidney disease, stage 3a: Secondary | ICD-10-CM | POA: Diagnosis not present

## 2022-05-05 DIAGNOSIS — E559 Vitamin D deficiency, unspecified: Secondary | ICD-10-CM | POA: Diagnosis not present

## 2022-05-12 DIAGNOSIS — B029 Zoster without complications: Secondary | ICD-10-CM | POA: Diagnosis not present

## 2022-05-12 DIAGNOSIS — N1831 Chronic kidney disease, stage 3a: Secondary | ICD-10-CM | POA: Diagnosis not present

## 2022-05-12 DIAGNOSIS — Z955 Presence of coronary angioplasty implant and graft: Secondary | ICD-10-CM | POA: Diagnosis not present

## 2022-05-12 DIAGNOSIS — K219 Gastro-esophageal reflux disease without esophagitis: Secondary | ICD-10-CM | POA: Diagnosis not present

## 2022-05-12 DIAGNOSIS — E785 Hyperlipidemia, unspecified: Secondary | ICD-10-CM | POA: Diagnosis not present

## 2022-05-12 DIAGNOSIS — I129 Hypertensive chronic kidney disease with stage 1 through stage 4 chronic kidney disease, or unspecified chronic kidney disease: Secondary | ICD-10-CM | POA: Diagnosis not present

## 2022-05-12 DIAGNOSIS — E559 Vitamin D deficiency, unspecified: Secondary | ICD-10-CM | POA: Diagnosis not present

## 2022-05-12 DIAGNOSIS — Z7982 Long term (current) use of aspirin: Secondary | ICD-10-CM | POA: Diagnosis not present

## 2022-05-12 DIAGNOSIS — E1122 Type 2 diabetes mellitus with diabetic chronic kidney disease: Secondary | ICD-10-CM | POA: Diagnosis not present

## 2022-06-02 ENCOUNTER — Other Ambulatory Visit: Payer: Self-pay

## 2022-06-02 ENCOUNTER — Emergency Department (HOSPITAL_BASED_OUTPATIENT_CLINIC_OR_DEPARTMENT_OTHER): Payer: Medicare Other

## 2022-06-02 ENCOUNTER — Encounter (HOSPITAL_BASED_OUTPATIENT_CLINIC_OR_DEPARTMENT_OTHER): Payer: Self-pay

## 2022-06-02 ENCOUNTER — Emergency Department (HOSPITAL_BASED_OUTPATIENT_CLINIC_OR_DEPARTMENT_OTHER)
Admission: EM | Admit: 2022-06-02 | Discharge: 2022-06-02 | Disposition: A | Discharge status: Home / Self Care | Payer: Medicare Other | Attending: Emergency Medicine | Admitting: Emergency Medicine

## 2022-06-02 DIAGNOSIS — W01198A Fall on same level from slipping, tripping and stumbling with subsequent striking against other object, initial encounter: Secondary | ICD-10-CM | POA: Diagnosis not present

## 2022-06-02 DIAGNOSIS — Z7982 Long term (current) use of aspirin: Secondary | ICD-10-CM | POA: Insufficient documentation

## 2022-06-02 DIAGNOSIS — E119 Type 2 diabetes mellitus without complications: Secondary | ICD-10-CM | POA: Diagnosis not present

## 2022-06-02 DIAGNOSIS — R55 Syncope and collapse: Secondary | ICD-10-CM | POA: Diagnosis not present

## 2022-06-02 DIAGNOSIS — I2510 Atherosclerotic heart disease of native coronary artery without angina pectoris: Secondary | ICD-10-CM | POA: Diagnosis not present

## 2022-06-02 DIAGNOSIS — Z7984 Long term (current) use of oral hypoglycemic drugs: Secondary | ICD-10-CM | POA: Diagnosis not present

## 2022-06-02 LAB — CBC WITH DIFFERENTIAL/PLATELET
Abs Immature Granulocytes: 0.03 10*3/uL (ref 0.00–0.07)
Basophils Absolute: 0.1 10*3/uL (ref 0.0–0.1)
Basophils Relative: 1 %
Eosinophils Absolute: 0.2 10*3/uL (ref 0.0–0.5)
Eosinophils Relative: 2 %
HCT: 46.5 % (ref 39.0–52.0)
Hemoglobin: 15.1 g/dL (ref 13.0–17.0)
Immature Granulocytes: 0 %
Lymphocytes Relative: 16 %
Lymphs Abs: 1.2 10*3/uL (ref 0.7–4.0)
MCH: 27.8 pg (ref 26.0–34.0)
MCHC: 32.5 g/dL (ref 30.0–36.0)
MCV: 85.6 fL (ref 80.0–100.0)
Monocytes Absolute: 0.6 10*3/uL (ref 0.1–1.0)
Monocytes Relative: 8 %
Neutro Abs: 5.5 10*3/uL (ref 1.7–7.7)
Neutrophils Relative %: 73 %
Platelets: 162 10*3/uL (ref 150–400)
RBC: 5.43 MIL/uL (ref 4.22–5.81)
RDW: 14.7 % (ref 11.5–15.5)
WBC: 7.6 10*3/uL (ref 4.0–10.5)
nRBC: 0 % (ref 0.0–0.2)

## 2022-06-02 LAB — BASIC METABOLIC PANEL
Anion gap: 11 (ref 5–15)
BUN: 22 mg/dL (ref 8–23)
CO2: 24 mmol/L (ref 22–32)
Calcium: 9.5 mg/dL (ref 8.9–10.3)
Chloride: 102 mmol/L (ref 98–111)
Creatinine, Ser: 1.31 mg/dL — ABNORMAL HIGH (ref 0.61–1.24)
GFR, Estimated: >60 mL/min (ref >60)
Glucose, Bld: 215 mg/dL — ABNORMAL HIGH (ref 70–99)
Potassium: 4.3 mmol/L (ref 3.5–5.1)
Sodium: 137 mmol/L (ref 135–145)

## 2022-06-02 LAB — TROPONIN I (HIGH SENSITIVITY): Troponin I (High Sensitivity): 4 ng/L (ref <18)

## 2022-06-02 MED ORDER — SODIUM CHLORIDE 0.9 % IV BOLUS
1000.0000 mL | Freq: Once | INTRAVENOUS | Status: AC
  Administered 2022-06-02: 1000 mL via INTRAVENOUS

## 2022-06-02 NOTE — Discharge Instructions (Signed)
All the blood work today looks reassuring.  Your EKG does show some extra beats occasionally but otherwise looks okay.  The scans of your head were normal without evidence of injury.  Make sure you are staying hydrated.  If you start developing symptoms of passing out with any type of exercise or you start having chest pain, shortness of breath or feeling like your heart is beating irregularly you should return to the emergency room or follow-up with your doctor urgently.  Otherwise keep your regularly scheduled appointment this month with your cardiologist.

## 2022-06-02 NOTE — ED Triage Notes (Signed)
POV from home with wife, pt sts that he woke up approx 0415 to go to the bathroom, pt sts that he just remembers waking up to wife calling his name and was on the floor, bruising noted to bilateral shoulders. Denies blood thinners, only baby aspirin, hx of diabetes, checked BGL after was 176 at home. Pt alert and oriented x 4, ambulatory to room

## 2022-06-02 NOTE — ED Provider Notes (Signed)
 [facility] DEPT  Provider Note  CSN: [id number] Arrival date & time: 06/02/22 0610  History Chief Complaint  Patient presents with   Loss of Consciousness    Henry Rivera is a 66 y.o. male with history of CAD reports he woke up around 0415hrs to urinate and while he was standing at the commode began to feel lightheaded. Next thing he remembers is his wife standing over him. She reports he passed out, fell backwards and hit the wall hard enough to knock a hole in the sheet rock. He was briefly disoriented but able to sit up then stand up with some assistance. She checked his sugar and his vitals and both were OK. He has been in his usual state of health recently. No fever, cough, CP, SOB, N/V/D. No recent travel or surgery. He is still feeling a little weak but was able to get up and to the ED by POV without difficulty. No prior history of syncope.    Home Medications Prior to Admission medications   Medication Sig Start Date End Date Taking? Authorizing Provider  aspirin 81 MG tablet Take 81 mg by mouth daily.    [provider]  Blood Glucose Monitoring Suppl (ONETOUCH VERIO FLEX SYSTEM) w/Device KIT Use to check blood sugar 2 times per day 07/17/20   Kumar, Ajay, MD  Cholecalciferol (VITAMIN D PO) Take by mouth. 1000 units once daily    [provider]  CINNAMON PO Take 1,000 capsules by mouth daily.    [provider]  esomeprazole (NEXIUM) 40 MG capsule Take 40 mg by mouth daily at 12 noon.    [provider]  FARXIGA 10 MG TABS tablet TAKE 1 TABLET BY MOUTH EVERY DAY 04/26/22   Kumar, Ajay, MD  fexofenadine (ALLEGRA) 180 MG tablet Take 180 mg by mouth daily.    [provider]  fluticasone (FLONASE) 50 MCG/ACT nasal spray Place 2 sprays into both nostrils as needed for allergies or rhinitis.    [provider]  glimepiride (AMARYL) 1 MG tablet TAKE 1 TABLET BY MOUTH DAILY WITH BREAKFAST. 04/26/22    Kumar, Ajay, MD  glucose blood test strip Use as instructed to check blood sugar 1 time daily as needed. 09/28/18   Kumar, Ajay, MD  imiquimod (ALDARA) 5 % cream Apply to arms nightly Mon. Wed. Friday 11/30/21   Tafeen, Stuart, MD  metFORMIN (GLUCOPHAGE) 1000 MG tablet Take 1,000 mg by mouth 2 (two) times daily with a meal.    [provider]  naproxen (NAPROSYN) 500 MG tablet Take 500 mg by mouth as needed. 01/01/21   [provider]  nitroGLYCERIN (NITROSTAT) 0.4 MG SL tablet Place 1 tablet (0.4 mg total) under the tongue every 5 (five) minutes as needed for chest pain. 04/23/19   Gerhardt, Lori C, NP  ONETOUCH VERIO test strip CHECK SUGAR 2 TIMES A DAY. 02/05/22   Kumar, Ajay, MD  rosuvastatin (CRESTOR) 5 MG tablet  03/05/15   [provider]  tamsulosin (FLOMAX) 0.4 MG CAPS capsule 1 capsule 11/13/21   [provider]  TRULICITY 0.75 MG/0.5ML SOPN INJECT 0.75 MG INTO THE SKIN ONCE A WEEK. Patient not taking: Reported on 02/12/2022 12/25/21   Kumar, Ajay, MD  valsartan (DIOVAN) 320 MG tablet Take 320 mg by mouth daily. 11/19/19   [provider]     Allergies    Augmentin [amoxicillin-pot clavulanate] and Fluogen [influenza virus vaccine]   Review of Systems   Review of   Systems Please see HPI for pertinent positives and negatives  Physical Exam BP 120/85   Pulse 72   Temp 97.8 F (36.6 C) (Oral)   Resp 19   Ht 6' 4" (1.93 m)   Wt 118.5 kg   SpO2 99%   BMI 31.80 kg/m   Physical Exam Vitals and nursing note reviewed.  Constitutional:      Appearance: Normal appearance.  HENT:     Head: Normocephalic and atraumatic.     Nose: Nose normal.     Mouth/Throat:     Mouth: Mucous membranes are moist.  Eyes:     Extraocular Movements: Extraocular movements intact.     Conjunctiva/sclera: Conjunctivae normal.  Cardiovascular:     Rate and Rhythm: Normal rate. Rhythm irregular.  Pulmonary:     Effort: Pulmonary effort is normal.     Breath  sounds: Normal breath sounds.  Abdominal:     General: Abdomen is flat.     Palpations: Abdomen is soft.     Tenderness: There is no abdominal tenderness.  Musculoskeletal:        General: No swelling. Normal range of motion.     Cervical back: Neck supple.     Comments: Contusion bilateral posterior shoulders  Skin:    General: Skin is warm and dry.  Neurological:     General: No focal deficit present.     Mental Status: He is alert.  Psychiatric:        Mood and Affect: Mood normal.     ED Results / Procedures / Treatments   EKG Rate 69-irregular Artifact obscures P waves Nonspecific IVCD Normal ST/T waves   Procedures Procedures  Medications Ordered in the ED Medications  sodium chloride 0.9 % bolus 1,000 mL (1,000 mLs Intravenous New Bag/Given 06/02/22 0651)    Initial Impression and Plan  Patient here with syncopal episode while urinating this morning. Will check labs, send for CT head and CXR. Currently appears in sinus arrhythmia on monitor  ED Course   Clinical Course as of 06/02/22 0718  Wed Jun 02, 2022  0716 Care of the patient will be signed out to Dr. Maryan Rued at the change of shift.  [CS]    Clinical Course User Index [CS] Truddie Hidden, MD     MDM Rules/Calculators/A&P Medical Decision Making Problems Addressed: Syncope, unspecified syncope type: acute illness or injury  Amount and/or Complexity of Data Reviewed Labs: ordered. Radiology: ordered. ECG/medicine tests: ordered and independent interpretation performed. Decision-making details documented in ED Course.    Final Clinical Impression(s) / ED Diagnoses Final diagnoses:  Syncope, unspecified syncope type    Rx / DC Orders ED Discharge Orders     None        Truddie Hidden, MD 06/02/22 [phone]

## 2022-06-02 NOTE — ED Provider Notes (Signed)
Assumed care from Dr. Karle Starch at 7 AM.  Patient presenting after a syncopal episode while he was standing urinating this morning at 4 AM.  Patient had preceding symptoms of feeling unwell and like he needed to sit down with some nausea.  He denied any palpitations, shortness of breath before or after the event.  He does have a prior history of LAD occlusion status post stent greater than 10 years ago but has been doing well.  He does report that about a week ago he finished a 2-week course of Valtrex for possible shingles and prior to that was on several rounds of antibiotics for a cyst but denies any other medication changes.  He has been eating and drinking normally and been feeling overall well.  I independently interpreted patient's EKG and labs.  EKG did show occasional PACs but no evidence of prolonged QT, Brugada's or WPW.  Orthostatics here were within normal limits.  CBC is within normal limits, CMP with creatinine of 1.31 which is not significantly different than baseline but may be slightly elevated as he did recently finished a course of Valtrex.  Troponin was within normal limits and was taken at about 3 hours after the event.  I have independently visualized and interpreted pt's images today. Head CT negative for intracranial hemorrhage and chest x-ray within normal limits.  Findings discussed with the patient and his wife.  At this time feel may have been micturition syncope or orthostatic syncope.  Patient has been able to ambulate to the bathroom without recurrent symptoms and feels well at this time.  Feel that he does not require admission at this time and is stable for discharge but was given return precautions.  He also has good follow-up with his PCP and cardiologist this month.   Blanchie Dessert, MD 06/02/22 [phone]

## 2022-06-03 DIAGNOSIS — R55 Syncope and collapse: Secondary | ICD-10-CM | POA: Diagnosis not present

## 2022-06-03 DIAGNOSIS — Z09 Encounter for follow-up examination after completed treatment for conditions other than malignant neoplasm: Secondary | ICD-10-CM | POA: Diagnosis not present

## 2022-06-03 DIAGNOSIS — I129 Hypertensive chronic kidney disease with stage 1 through stage 4 chronic kidney disease, or unspecified chronic kidney disease: Secondary | ICD-10-CM | POA: Diagnosis not present

## 2022-06-10 ENCOUNTER — Telehealth: Payer: Self-pay

## 2022-06-10 NOTE — Telephone Encounter (Signed)
     Patient  visit on 11/1  at [place] you been able to follow up with your primary care physician? YES  The patient was or was not able to obtain any needed medicine or equipment. YES  Are there diet recommendations that you are having difficulty following? NA  Patient expresses understanding of discharge instructions and education provided has no other needs at this time.  [place], [facility], Care Management  [phone] [address], [place], [place] [address] Phone: [phone] Email: [email or web address]

## 2022-06-14 NOTE — Progress Notes (Unsigned)
Cardiology Office Note:    Date:  06/17/2022   ID:  Henry Rivera, DOB Feb 18, 1956, MRN [id number]  PCP:  Henry Pretty, MD   Henry Rivera Cardiologist:  Henry Grooms, MD     Referring MD: Henry Pretty, MD   Chief Complaint: hypertension  History of Present Illness:    Henry Rivera is a very pleasant 66 y.o. male with a hx of CAD with DES to LAD 2012, type 2 diabetes, HLD, and HTN.   Prior stenting of LAD in 2012.  ETT 09/2017 revealed no ischemia, hypertensive response to exercise. He has maintained consistent follow-up.  Last cardiology clinic visit was 06/12/2019 with Dr. Tamala Rivera at which time he was doing well. No changes were made to treatment regimen and 1 year follow-up was recommended.   Today, he is here alone for follow-up. Is feeling well today. Had one occurrence of syncope on November 1. Got up to urinate, woke up on the floor. There were 3 thuds per his wife. Hit head on toilet paper dispenser, possibly on the toilet and his head broke the sheet rock. Went to [facility] ED, had imaging and then saw PCP with no concerning findings. Diagnosed with micturition syncope. Stopped valsartan. Had seen for 6 month check up just prior to the event and she noted BP was low normal, considered stopping valsartan at that time. In follow-up after syncope, he was advised to stop valsartan. No further episodes of presyncope, syncope. He has been tracking home BP readings which are well-controlled. Prior to November 1, no concerning symptoms of chest pain, shortness of breath, palpitations, orthopnea, PND, bleeding. Was exercising regularly, with the exception of just prior to syncope, he had shingles on left arm and severe nerve pain in rib area. Cholesterol has been well-controlled, total has been 70-100 on recent labs.   Past Medical History:  Diagnosis Date   Allergic rhinitis    Allergy    Basal cell carcinoma 03/19/2015   L BACK, SUPERFICIAL, CX3, CAUTERY, 5FU    Basal cell carcinoma (BCC) 07/14/2021   R Upper Arm - CX3, 5FU   Basal cell carcinoma (BCC) 07/14/2021   L Forearm - CX3, 5FU   CAD (coronary artery disease) 11/2010   WITH LAD DES    Cataract    forming- small    Diabetes ([place]) 2007   Elevated cholesterol    pt denies this 11-11-15- states his cholesterol is fine    GERD (gastroesophageal reflux disease)    Hyperlipidemia    pt states uses Crestor due to Stent - not HLD    Hypertension    MI (mitral incompetence)    SCCA (squamous cell carcinoma) of skin 07/14/2021   Right Hand - posterior (in situ)   Squamous cell carcinoma of skin 10/27/2009   SCC A/KA, L ELBOW, EXC   Vitamin D deficiency     Past Surgical History:  Procedure Laterality Date   COLONOSCOPY  03-27-2004   2017- #2   CORONARY ANGIOPLASTY WITH STENT PLACEMENT  11-2010   POLYPECTOMY     VASECTOMY      Current Medications: Current Meds  Medication Sig   aspirin 81 MG tablet Take 81 mg by mouth daily.   Blood Glucose Monitoring Suppl (ONETOUCH VERIO FLEX SYSTEM) w/Device KIT Use to check blood sugar 2 times per day   Cholecalciferol (VITAMIN D PO) Take by mouth. 1000 units once daily   CINNAMON PO Take 1,000 capsules by mouth daily.  esomeprazole (NEXIUM) 40 MG capsule Take 40 mg by mouth daily at 12 noon.   FARXIGA 10 MG TABS tablet TAKE 1 TABLET BY MOUTH EVERY DAY   fexofenadine (ALLEGRA) 180 MG tablet Take 180 mg by mouth daily.   fluticasone (FLONASE) 50 MCG/ACT nasal spray Place 2 sprays into both nostrils as needed for allergies or rhinitis.   glimepiride (AMARYL) 1 MG tablet TAKE 1 TABLET BY MOUTH DAILY WITH BREAKFAST.   glucose blood test strip Use as instructed to check blood sugar 1 time daily as needed.   metFORMIN (GLUCOPHAGE) 1000 MG tablet Take 1,000 mg by mouth 2 (two) times daily with a meal.   methocarbamol (ROBAXIN) 500 MG tablet Take 500 mg by mouth as needed for muscle spasms.   naproxen (NAPROSYN) 500 MG tablet Take 500 mg by mouth as  needed.   nitroGLYCERIN (NITROSTAT) 0.4 MG SL tablet Place 1 tablet (0.4 mg total) under the tongue every 5 (five) minutes as needed for chest pain.   ONETOUCH VERIO test strip CHECK SUGAR 2 TIMES A DAY.   rosuvastatin (CRESTOR) 5 MG tablet    tamsulosin (FLOMAX) 0.4 MG CAPS capsule 1 capsule     Allergies:   Augmentin [amoxicillin-pot clavulanate] and Fluogen [influenza virus vaccine]   Social History   Socioeconomic History   Marital status: Married    Spouse name: Not on file   Number of children: Not on file   Years of education: Not on file   Highest education level: Not on file  Occupational History   Not on file  Tobacco Use   Smoking status: Never   Smokeless tobacco: Never  Vaping Use   Vaping Use: Never used  Substance and Sexual Activity   Alcohol use: No    Alcohol/week: 0.0 standard drinks of alcohol   Drug use: No   Sexual activity: Not on file  Other Topics Concern   Not on file  Social History Narrative   Not on file   Social Determinants of Health   Financial Resource Strain: Not on file  Food Insecurity: Not on file  Transportation Needs: Not on file  Physical Activity: Not on file  Stress: Not on file  Social Connections: Not on file     Family History: The patient's family history includes Diabetes in his paternal grandfather; Heart failure in his paternal grandfather; Hepatitis in his brother. There is no history of Colon cancer, Colon polyps, Esophageal cancer, Rectal cancer, or Stomach cancer.  ROS:   Please see the history of present illness.   All other systems reviewed and are negative.  Labs/Other Studies Reviewed:    The following studies were reviewed today:  ETT 09/2017  Blood pressure demonstrated a hypertensive response to exercise. There was no ST segment deviation noted during stress.   No ischemia. Hypertensive response to exercise. Normal exercise capacity.   Echo 03/2015  LVEF 55-60%, mild concentric hypertrophy,  possible mild hypokinesis of the apical anterior and anterior lateral myocardium Normal diastolic parameters, normal RV Mildly dilated aortic root, mildly dilated LA No significant valvular abnormalities  Recent Labs: 06/02/2022: BUN 22; Creatinine, Ser 1.31; Hemoglobin 15.1; Platelets 162; Potassium 4.3; Sodium 137  Recent Lipid Panel    Component Value Date/Time   CHOL 75 08/25/2018 0757   TRIG 79.0 08/25/2018 0757   HDL 36.80 (L) 08/25/2018 0757   CHOLHDL 2 08/25/2018 0757   VLDL 15.8 08/25/2018 0757   LDLCALC 22 08/25/2018 0757     Risk Assessment/Calculations:  Physical Exam:    VS:  BP 120/72   Pulse 68   Ht 6' 4.5" (1.943 m)   Wt 266 lb (120.7 kg)   SpO2 97%   BMI 31.96 kg/m     Wt Readings from Last 3 Encounters:  06/17/22 266 lb (120.7 kg)  06/02/22 261 lb 3.9 oz (118.5 kg)  02/12/22 261 lb 3.2 oz (118.5 kg)     GEN:  Well nourished, well developed in no acute distress HEENT: Normal NECK: No JVD; No carotid bruits CARDIAC: RRR, no murmurs, rubs, gallops RESPIRATORY:  Clear to auscultation without rales, wheezing or rhonchi  ABDOMEN: Soft, non-tender, non-distended MUSCULOSKELETAL:  No edema; No deformity. 2+ pedal pulses, equal bilaterally SKIN: Warm and dry NEUROLOGIC:  Alert and oriented x 3 PSYCHIATRIC:  Normal affect   EKG:  EKG is ordered today.  The ekg ordered today demonstrates NSR at 68 bpm, nonspecific T wave abnormality   Diagnoses:    1. Coronary artery disease involving native coronary artery of native heart without angina pectoris   2. Essential hypertension   3. Hyperlipidemia LDL goal <70    Assessment and Plan:     CAD without angina: He denies chest pain, dyspnea, or other symptoms concerning for angina.  No indication for further ischemic evaluation at this time.  Secondary prevention emphasized including 150 minutes moderate intensity exercise each week, heart healthy, mostly plant-based diet and good glucose and BP  control.  No bleeding concerns on aspirin.  Continue rosuvastatin, Farxiga, aspirin.  Hypertension: BP is well-controlled since stopping valsartan.  Advised him to continue to monitor and notify us with concerns. He also sees endocrinology and PCP on a consistent basis.  Hyperlipidemia LDL goal < 70: Most recent LDL to review was 34 on 10/30/2020.  He reports it has always been low, even prior to starting rosuvastatin.  Type 2 diabetes: A1C 7.0 02/09/22. Sees Dr. Dwyane Dee, endocrinology.  Continue healthy lifestyle.      Disposition: 1 year - will need new provider, transfer from Dr. Tamala Rivera  Medication Adjustments/Labs and Tests Ordered: Current medicines are reviewed at length with the patient today.  Concerns regarding medicines are outlined above.  Orders Placed This Encounter  Procedures   EKG 12-Lead   No orders of the defined types were placed in this encounter.   Patient Instructions  Medication Instructions:   Your physician recommends that you continue on your current medications as directed. Please refer to the Current Medication list given to you today.  *If you need a refill on your cardiac medications before your next appointment, please call your pharmacy*   Lab Work:  None ordered.  If you have labs (blood work) drawn today and your tests are completely normal, you will receive your results only by: [place] (if you have MyChart) OR A paper copy in the mail If you have any lab test that is abnormal or we need to change your treatment, we will call you to review the results.   Testing/Procedures:  None ordered.   Follow-Up: At [facility], you and your health needs are our priority.  As part of our continuing mission to provide you with exceptional heart care, we have created designated Provider Care Teams.  These Care Teams include your primary Cardiologist (physician) and Advanced Practice [facility] (APPs -  Physician Assistants and Nurse  Practitioners) who all work together to provide you with the care you need, when you need it.  We recommend signing up for the  patient portal called "MyChart".  Sign up information is provided on this After Visit Summary.  MyChart is used to connect with patients for Virtual Visits (Telemedicine).  Patients are able to view lab/test results, encounter notes, upcoming appointments, etc.  Non-urgent messages can be sent to your provider as well.   To learn more about what you can do with MyChart, go to [email or web address].    Your next appointment:   1 year(s)  The format for your next appointment:   In Person  Provider:   New Provider     Other Instructions  Your physician wants you to follow-up in: 1 year with New Cardiologist.  You will receive a reminder letter in the mail two months in advance. If you don't receive a letter, please call our office to schedule the follow-up appointment.  Adopting a Healthy Lifestyle.   Weight: Know what a healthy weight is for you (roughly BMI <25) and aim to maintain this. You can calculate your body mass index on your smart phone  Diet: Aim for 7+ servings of fruits and vegetables daily Limit animal fats in diet for cholesterol and heart health - choose grass fed whenever available Avoid highly processed foods (fast food burgers, tacos, fried chicken, pizza, hot dogs, french fries)  Saturated fat comes in the form of butter, lard, coconut oil, margarine, partially hydrogenated oils, and fat in meat. These increase your risk of cardiovascular disease.  Use healthy plant oils, such as olive, canola, soy, corn, sunflower and peanut.  Whole foods such as fruits, vegetables and whole grains have fiber  Men need > 38 grams of fiber per day Women need > 25 grams of fiber per day  Load up on vegetables and fruits - one-half of your plate: Aim for color and variety, and remember that potatoes dont count. Go for whole grains - one-quarter of your plate:  Whole wheat, barley, wheat berries, quinoa, oats, brown rice, and foods made with them. If you want pasta, go with whole wheat pasta. Protein power - one-quarter of your plate: Fish, chicken, beans, and nuts are all healthy, versatile protein sources. Limit red meat. You need carbohydrates for energy! The type of carbohydrate is more important than the amount. Choose carbohydrates such as vegetables, fruits, whole grains, beans, and nuts in the place of white rice, white pasta, potatoes (baked or fried), macaroni and cheese, cakes, cookies, and donuts.  If youre thirsty, drink water. Coffee and tea are good in moderation, but skip sugary drinks and limit milk and dairy products to one or two daily servings. Keep sugar intake at 6 teaspoons or 24 grams or LESS       Exercise: Aim for 150 min of moderate intensity exercise weekly for heart health, and weights twice weekly for bone health Stay active - any steps are better than no steps! Aim for 7-9 hours of sleep daily       Important Information About Sugar         Signed, Emmaline Life, NP  06/17/2022 12:52 PM    Henry Rivera

## 2022-06-17 ENCOUNTER — Ambulatory Visit: Payer: Medicare Other | Attending: Nurse Practitioner | Admitting: Nurse Practitioner

## 2022-06-17 ENCOUNTER — Encounter: Payer: Self-pay | Admitting: Nurse Practitioner

## 2022-06-17 VITALS — BP 120/72 | HR 68 | Ht 76.5 in | Wt 266.0 lb

## 2022-06-17 DIAGNOSIS — I10 Essential (primary) hypertension: Secondary | ICD-10-CM

## 2022-06-17 DIAGNOSIS — E785 Hyperlipidemia, unspecified: Secondary | ICD-10-CM

## 2022-06-17 DIAGNOSIS — I2510 Atherosclerotic heart disease of native coronary artery without angina pectoris: Secondary | ICD-10-CM | POA: Diagnosis not present

## 2022-06-17 NOTE — Patient Instructions (Addendum)
Medication Instructions:   Your physician recommends that you continue on your current medications as directed. Please refer to the Current Medication list given to you today.  *If you need a refill on your cardiac medications before your next appointment, please call your pharmacy*   Lab Work:  None ordered.  If you have labs (blood work) drawn today and your tests are completely normal, you will receive your results only by: [place] (if you have MyChart) OR A paper copy in the mail If you have any lab test that is abnormal or we need to change your treatment, we will call you to review the results.   Testing/Procedures:  None ordered.   Follow-Up: At [facility], you and your health needs are our priority.  As part of our continuing mission to provide you with exceptional heart care, we have created designated Provider Care Teams.  These Care Teams include your primary Cardiologist (physician) and Advanced Practice Providers (APPs -  Physician Assistants and Nurse Practitioners) who all work together to provide you with the care you need, when you need it.  We recommend signing up for the patient portal called "MyChart".  Sign up information is provided on this After Visit Summary.  MyChart is used to connect with patients for Virtual Visits (Telemedicine).  Patients are able to view lab/test results, encounter notes, upcoming appointments, etc.  Non-urgent messages can be sent to your provider as well.   To learn more about what you can do with MyChart, go to [email or web address].    Your next appointment:   1 year(s)  The format for your next appointment:   In Person  Provider:   New Provider     Other Instructions  Your physician wants you to follow-up in: 1 year with New Cardiologist.  You will receive a reminder letter in the mail two months in advance. If you don't receive a letter, please call our office to schedule the follow-up  appointment.  Adopting a Healthy Lifestyle.   Weight: Know what a healthy weight is for you (roughly BMI <25) and aim to maintain this. You can calculate your body mass index on your smart phone  Diet: Aim for 7+ servings of fruits and vegetables daily Limit animal fats in diet for cholesterol and heart health - choose grass fed whenever available Avoid highly processed foods (fast food burgers, tacos, fried chicken, pizza, hot dogs, french fries)  Saturated fat comes in the form of butter, lard, coconut oil, margarine, partially hydrogenated oils, and fat in meat. These increase your risk of cardiovascular disease.  Use healthy plant oils, such as olive, canola, soy, corn, sunflower and peanut.  Whole foods such as fruits, vegetables and whole grains have fiber  Men need > 38 grams of fiber per day Women need > 25 grams of fiber per day  Load up on vegetables and fruits - one-half of your plate: Aim for color and variety, and remember that potatoes dont count. Go for whole grains - one-quarter of your plate: Whole wheat, barley, wheat berries, quinoa, oats, brown rice, and foods made with them. If you want pasta, go with whole wheat pasta. Protein power - one-quarter of your plate: Fish, chicken, beans, and nuts are all healthy, versatile protein sources. Limit red meat. You need carbohydrates for energy! The type of carbohydrate is more important than the amount. Choose carbohydrates such as vegetables, fruits, whole grains, beans, and nuts in the place of white rice, white pasta, potatoes (baked  or fried), macaroni and cheese, cakes, cookies, and donuts.  If youre thirsty, drink water. Coffee and tea are good in moderation, but skip sugary drinks and limit milk and dairy products to one or two daily servings. Keep sugar intake at 6 teaspoons or 24 grams or LESS       Exercise: Aim for 150 min of moderate intensity exercise weekly for heart health, and weights twice weekly for bone  health Stay active - any steps are better than no steps! Aim for 7-9 hours of sleep daily       Important Information About Sugar

## 2022-07-05 ENCOUNTER — Other Ambulatory Visit (INDEPENDENT_AMBULATORY_CARE_PROVIDER_SITE_OTHER): Payer: Medicare Other

## 2022-07-05 DIAGNOSIS — E1165 Type 2 diabetes mellitus with hyperglycemia: Secondary | ICD-10-CM

## 2022-07-05 LAB — HEMOGLOBIN A1C: Hgb A1c MFr Bld: 7.8 % — ABNORMAL HIGH (ref 4.6–6.5)

## 2022-07-05 LAB — BASIC METABOLIC PANEL
BUN: 23 mg/dL (ref 6–23)
CO2: 25 mEq/L (ref 19–32)
Calcium: 9.6 mg/dL (ref 8.4–10.5)
Chloride: 105 mEq/L (ref 96–112)
Creatinine, Ser: 1.29 mg/dL (ref 0.40–1.50)
GFR: 57.79 mL/min — ABNORMAL LOW (ref >60.00)
Glucose, Bld: 156 mg/dL — ABNORMAL HIGH (ref 70–99)
Potassium: 4.5 mEq/L (ref 3.5–5.1)
Sodium: 138 mEq/L (ref 135–145)

## 2022-07-06 NOTE — Progress Notes (Unsigned)
Patient ID: Henry Rivera, male   DOB: 06/26/1956, 66 y.o.   MRN: [id number]            Reason for Appointment: Follow-up visit   History of Present Illness:          Date of diagnosis of type 2 diabetes mellitus: 2007      Background history:   His A1c was apparently 12.7 at the time of diagnosis when he had increased thirst and urination He was started on Janumet and apparently continued unchanged until last year, this was stopped because of high out-of-pocket expense He was subsequently on metformin only and then Jardiance 10 mg started His A1c in 2017 was 6.7 twice, previous records not available.  Recent history:       Non-insulin hypoglycemic drugs the patient is taking are: Farxiga 10 mg, metformin 1 g twice a day, Amaryl 1 mg at dinnertime  His A1c is 7.8 compared to 7  Current management, blood sugar patterns and problems identified:  He has not been able to afford his Trulicity because of cost  Even though he has been sugar was not high without Trulicity for a month in July it is now increased along with some weight gain Most of his high readings are after breakfast but he generally is not checking readings after breakfast or lunch This is despite starting Amaryl on the last visit Also for various reason he has not been exercising regularly, normally likes to walk several miles every morning He is taking [place] and metformin regularly No change in renal function Overall diet is fairly good but he has somewhat more difficulty watching portions without Trulicity  Blood sugar averages are as below       Side effects from medications have been: Nausea from high-dose Trulicity  Compliance with the medical regimen: Usually good  Glucose monitoring:  done 1-2 times a day         Glucometer: One Touch   .       PRE-MEAL Fasting Lunch Dinner Bedtime Overall  Glucose range: 125-175    100-175  Mean/median: 141    139   POST-MEAL PC Breakfast PC Lunch PC Dinner   Glucose range:   100-169  Mean/median:   139   PREVIOUSLY  FASTING range from 110-159 with AVERAGE 138 Late evening average 137, range 92-194  Overall AVERAGE 136   Self-care: The diet that the patient has been following is: tries to limit high-fat foods .                  Dietician visit, most recent: none, previously attended classes at cardiac rehabilitation               Weight history: Maximum weight had been 318  Wt Readings from Last 3 Encounters:  07/07/22 266 lb (120.7 kg)  06/17/22 266 lb (120.7 kg)  06/02/22 261 lb 3.9 oz (118.5 kg)    Glycemic control:    Lab Results  Component Value Date   HGBA1C 7.8 (H) 07/05/2022   HGBA1C 7.0 (H) 02/09/2022   HGBA1C 6.9 (H) 09/24/2021   Lab Results  Component Value Date   MICROALBUR 1.4 02/09/2022   LDLCALC 22 08/25/2018   CREATININE 1.29 07/05/2022   Lab Results  Component Value Date   MICRALBCREAT 1.0 02/09/2022     Other active problems: See review of systems   Allergies as of 07/07/2022       Reactions   Augmentin [amoxicillin-pot Clavulanate]  Gi sx   Fluogen [influenza Virus Vaccine]    Other reaction(s): Unknown        Medication List        Accurate as of July 07, 2022 11:59 PM. If you have any questions, ask your nurse or doctor.          aspirin 81 MG tablet Take 81 mg by mouth daily.   CINNAMON PO Take 1,000 capsules by mouth daily.   esomeprazole 40 MG capsule Commonly known as: NEXIUM Take 40 mg by mouth daily at 12 noon.   Farxiga 10 MG Tabs tablet Generic drug: dapagliflozin propanediol TAKE 1 TABLET BY MOUTH EVERY DAY   fexofenadine 180 MG tablet Commonly known as: ALLEGRA Take 180 mg by mouth daily.   fluticasone 50 MCG/ACT nasal spray Commonly known as: FLONASE Place 2 sprays into both nostrils as needed for allergies or rhinitis.   glimepiride 1 MG tablet Commonly known as: AMARYL TAKE 1 TABLET BY MOUTH DAILY WITH BREAKFAST.   glucose blood test  strip Use as instructed to check blood sugar 1 time daily as needed.   OneTouch Verio test strip Generic drug: glucose blood CHECK SUGAR 2 TIMES A DAY.   metFORMIN 1000 MG tablet Commonly known as: GLUCOPHAGE Take 1,000 mg by mouth 2 (two) times daily with a meal.   methocarbamol 500 MG tablet Commonly known as: ROBAXIN Take 500 mg by mouth as needed for muscle spasms.   naproxen 500 MG tablet Commonly known as: NAPROSYN Take 500 mg by mouth as needed.   nitroGLYCERIN 0.4 MG SL tablet Commonly known as: NITROSTAT Place 1 tablet (0.4 mg total) under the tongue every 5 (five) minutes as needed for chest pain.   OneTouch Verio Flex System w/Device Kit Use to check blood sugar 2 times per day   rosuvastatin 5 MG tablet Commonly known as: CRESTOR   tamsulosin 0.4 MG Caps capsule Commonly known as: FLOMAX 1 capsule   VITAMIN D PO Take by mouth. 1000 units once daily        Allergies:  Allergies  Allergen Reactions   Augmentin [Amoxicillin-Pot Clavulanate]     Gi sx   Fluogen [Influenza Virus Vaccine]     Other reaction(s): Unknown    Past Medical History:  Diagnosis Date   Allergic rhinitis    Allergy    Basal cell carcinoma 03/19/2015   L BACK, SUPERFICIAL, CX3, CAUTERY, 5FU   Basal cell carcinoma (BCC) 07/14/2021   R Upper Arm - CX3, 5FU   Basal cell carcinoma (BCC) 07/14/2021   L Forearm - CX3, 5FU   CAD (coronary artery disease) 11/2010   WITH LAD DES    Cataract    forming- small    Diabetes ([place]) 2007   Elevated cholesterol    pt denies this 11-11-15- states his cholesterol is fine    GERD (gastroesophageal reflux disease)    Hyperlipidemia    pt states uses Crestor due to Stent - not HLD    Hypertension    MI (mitral incompetence)    SCCA (squamous cell carcinoma) of skin 07/14/2021   Right Hand - posterior (in situ)   Squamous cell carcinoma of skin 10/27/2009   SCC A/KA, L ELBOW, EXC   Vitamin D deficiency     Past Surgical History:   Procedure Laterality Date   COLONOSCOPY  03-27-2004   2017- #2   CORONARY ANGIOPLASTY WITH STENT PLACEMENT  11-2010   POLYPECTOMY     VASECTOMY  Family History  Problem Relation Age of Onset   Hepatitis Brother    Diabetes Paternal Grandfather    Heart failure Paternal Grandfather        CHF   Colon cancer Neg Hx    Colon polyps Neg Hx    Esophageal cancer Neg Hx    Rectal cancer Neg Hx    Stomach cancer Neg Hx     Social History:  reports that he has never smoked. He has never used smokeless tobacco. He reports that he does not drink alcohol and does not use drugs.   Review of Systems   Lipid history:  Baseline cholesterol has been normal, on Crestor for history of CAD, only on 5 mg every other day as prescribed by PCP  Followed by PCP, last LDL was 34 done in 3/22 and total cholesterol 89 in 9/22     Lab Results  Component Value Date   CHOL 75 08/25/2018   HDL 36.80 (L) 08/25/2018   LDLCALC 22 08/25/2018   TRIG 79.0 08/25/2018   CHOLHDL 2 08/25/2018           Hypertension: Treated by PCP with valsartan 160 mg previously, his dose was reduced on the last visit but subsequently had syncopal episode and this has been stopped  He has had long-standing hypertension and is followed by PCP/cardiology  BP Readings from Last 3 Encounters:  07/07/22 128/74  06/17/22 120/72  06/02/22 108/64    Renal function: This has been variable, not taking any ibuprofen  Has not had any microalbuminuria  Lab Results  Component Value Date   CREATININE 1.29 07/05/2022   CREATININE 1.31 (H) 06/02/2022   CREATININE 1.45 02/09/2022    Has had foot exam with podiatrist and PCP annually  NEUROPATHY: He has mild numbness/decrease in monofilament sensation in his toes for some time    LABS:  Lab on 07/05/2022  Component Date Value Ref Range Status   Sodium 07/05/2022 138  135 - 145 mEq/L Final   Potassium 07/05/2022 4.5  3.5 - 5.1 mEq/L Final   Chloride 07/05/2022 105   96 - 112 mEq/L Final   CO2 07/05/2022 25  19 - 32 mEq/L Final   Glucose, Bld 07/05/2022 156 (H)  70 - 99 mg/dL Final   BUN 07/05/2022 23  6 - 23 mg/dL Final   Creatinine, Ser 07/05/2022 1.29  0.40 - 1.50 mg/dL Final   GFR 07/05/2022 57.79 (L)  >60.00 mL/min Final   Calculated using the CKD-EPI Creatinine Equation (2021)   Calcium 07/05/2022 9.6  8.4 - 10.5 mg/dL Final   Hgb A1c MFr Bld 07/05/2022 7.8 (H)  4.6 - 6.5 % Final   Glycemic Control Guidelines for People with Diabetes:Non Diabetic:  <6%Goal of Therapy: <7%Additional Action Suggested:  >8%     Physical Examination:  BP 128/74   Pulse 69   Ht 6' 4.5" (1.943 m)   Wt 266 lb (120.7 kg)   SpO2 98%   BMI 31.96 kg/m    ASSESSMENT:  Diabetes type 2, non-insulin-dependent  Current regimen is Farxiga 10 mg, Amaryl 1 mg and Metformin 2 g  See history of present illness for detailed discussion of current diabetes management, blood sugar patterns and problems identified  His A1c is increased at 7.8 compared to 7  Previously had done well with Trulicity 0.25 which he could not afford  Not benefiting much from Amaryl as fasting readings are high and may be higher during the day which he does not  monitor Exercise has been inconsistent  HYPERTENSION: Blood pressure has not increased with stopping Diovan  Mild renal dysfunction: Creatinine is high normal  Lipids: Needs follow-up with his PCP   PLAN:    He will check on the cost of Trulicity otherwise may give him a trial of Januvia again if this is not too expensive Discussed that he will need to check to see if Trulicity will be better covered with his insurance next year  Periodic readings after lunch also Resume regular walking  Follow-up with PCP regarding lipids   There are no Patient Instructions on file for this visit.      Elayne Snare 07/08/2022, 2:37 PM   Note: This office note was prepared with Dragon voice recognition system technology. Any  transcriptional errors that result from this process are unintentional.

## 2022-07-07 ENCOUNTER — Ambulatory Visit: Payer: Medicare Other | Admitting: Endocrinology

## 2022-07-07 ENCOUNTER — Encounter: Payer: Self-pay | Admitting: Endocrinology

## 2022-07-07 VITALS — BP 128/74 | HR 69 | Ht 76.5 in | Wt 266.0 lb

## 2022-07-07 DIAGNOSIS — E1165 Type 2 diabetes mellitus with hyperglycemia: Secondary | ICD-10-CM | POA: Diagnosis not present

## 2022-07-07 DIAGNOSIS — E7800 Pure hypercholesterolemia, unspecified: Secondary | ICD-10-CM | POA: Diagnosis not present

## 2022-07-07 DIAGNOSIS — N289 Disorder of kidney and ureter, unspecified: Secondary | ICD-10-CM | POA: Diagnosis not present

## 2022-07-23 DIAGNOSIS — R0981 Nasal congestion: Secondary | ICD-10-CM | POA: Diagnosis not present

## 2022-07-23 DIAGNOSIS — J069 Acute upper respiratory infection, unspecified: Secondary | ICD-10-CM | POA: Diagnosis not present

## 2022-07-23 DIAGNOSIS — R059 Cough, unspecified: Secondary | ICD-10-CM | POA: Diagnosis not present

## 2022-07-28 DIAGNOSIS — R062 Wheezing: Secondary | ICD-10-CM | POA: Diagnosis not present

## 2022-07-28 DIAGNOSIS — R058 Other specified cough: Secondary | ICD-10-CM | POA: Diagnosis not present

## 2022-09-02 DIAGNOSIS — R0981 Nasal congestion: Secondary | ICD-10-CM | POA: Diagnosis not present

## 2022-09-02 DIAGNOSIS — J029 Acute pharyngitis, unspecified: Secondary | ICD-10-CM | POA: Diagnosis not present

## 2022-09-02 DIAGNOSIS — R051 Acute cough: Secondary | ICD-10-CM | POA: Diagnosis not present

## 2022-09-03 ENCOUNTER — Other Ambulatory Visit: Payer: Self-pay | Admitting: Endocrinology

## 2022-09-07 ENCOUNTER — Encounter: Payer: Self-pay | Admitting: Endocrinology

## 2022-09-07 DIAGNOSIS — E1165 Type 2 diabetes mellitus with hyperglycemia: Secondary | ICD-10-CM

## 2022-09-12 DIAGNOSIS — M25562 Pain in left knee: Secondary | ICD-10-CM | POA: Diagnosis not present

## 2022-09-16 DIAGNOSIS — M25562 Pain in left knee: Secondary | ICD-10-CM | POA: Diagnosis not present

## 2022-09-17 MED ORDER — TRULICITY 0.75 MG/0.5ML ~~LOC~~ SOAJ: SUBCUTANEOUS | 2 refills | Status: DC

## 2022-09-23 DIAGNOSIS — J0100 Acute maxillary sinusitis, unspecified: Secondary | ICD-10-CM | POA: Diagnosis not present

## 2022-09-23 DIAGNOSIS — J069 Acute upper respiratory infection, unspecified: Secondary | ICD-10-CM | POA: Diagnosis not present

## 2022-09-29 ENCOUNTER — Other Ambulatory Visit (INDEPENDENT_AMBULATORY_CARE_PROVIDER_SITE_OTHER): Payer: Medicare Other

## 2022-09-29 DIAGNOSIS — E1165 Type 2 diabetes mellitus with hyperglycemia: Secondary | ICD-10-CM

## 2022-09-29 DIAGNOSIS — E7800 Pure hypercholesterolemia, unspecified: Secondary | ICD-10-CM

## 2022-09-29 LAB — BASIC METABOLIC PANEL
BUN: 26 mg/dL — ABNORMAL HIGH (ref 6–23)
CO2: 26 mEq/L (ref 19–32)
Calcium: 10 mg/dL (ref 8.4–10.5)
Chloride: 103 mEq/L (ref 96–112)
Creatinine, Ser: 1.24 mg/dL (ref 0.40–1.50)
GFR: 60.5 mL/min (ref >60.00)
Glucose, Bld: 132 mg/dL — ABNORMAL HIGH (ref 70–99)
Potassium: 4.4 mEq/L (ref 3.5–5.1)
Sodium: 137 mEq/L (ref 135–145)

## 2022-09-29 LAB — LDL CHOLESTEROL, DIRECT: Direct LDL: 33 mg/dL

## 2022-09-29 LAB — HEMOGLOBIN A1C: Hgb A1c MFr Bld: 7.8 % — ABNORMAL HIGH (ref 4.6–6.5)

## 2022-10-06 ENCOUNTER — Ambulatory Visit: Payer: Medicare Other | Admitting: Endocrinology

## 2022-10-06 ENCOUNTER — Encounter: Payer: Self-pay | Admitting: Endocrinology

## 2022-10-06 VITALS — BP 132/80 | HR 76 | Ht 76.5 in | Wt 257.0 lb

## 2022-10-06 DIAGNOSIS — E1165 Type 2 diabetes mellitus with hyperglycemia: Secondary | ICD-10-CM | POA: Diagnosis not present

## 2022-10-06 DIAGNOSIS — E7800 Pure hypercholesterolemia, unspecified: Secondary | ICD-10-CM

## 2022-10-06 NOTE — Progress Notes (Signed)
Patient ID: Henry Rivera, male   DOB: 12-Aug-1955, 67 y.o.   MRN: [id number]            Reason for Appointment: Follow-up visit   History of Present Illness:          Date of diagnosis of type 2 diabetes mellitus: 2007      Background history:   His A1c was apparently 12.7 at the time of diagnosis when he had increased thirst and urination He was started on Janumet and apparently continued unchanged until last year, this was stopped because of high out-of-pocket expense He was subsequently on metformin only and then Jardiance 10 mg started His A1c in 2017 was 6.7 twice, previous records not available.  Recent history:       Non-insulin hypoglycemic drugs the patient is taking are: Farxiga 10 mg, metformin 1 g twice a day  His A1c is 7.8   Current management, blood sugar patterns and problems identified:  He had not been able to afford his Trulicity because of cost but now has gone back on it about 2 weeks ago Glimepiride was stopped and he says that it was causing GI symptoms and diarrhea which has resolved His blood sugars are looking slightly better fasting than before but still has readings in the 120-140 range Recently and does not have as many readings after dinner He is doing some walking although this has been intermittent because of issues with respiratory infection and knee problems Renal function stable with continuing Farxiga       Side effects from medications have been: Nausea from high-dose Trulicity  Compliance with the medical regimen: Usually good  Glucose monitoring:  done 1-2 times a day         Glucometer: One Touch   .      14-day average 130, previously 144 by patient history Recent readings by meter review  PRE-MEAL Fasting Lunch Dinner Bedtime Overall  Glucose range: 116-142      Mean/median:        POST-MEAL PC Breakfast PC Lunch PC Dinner  Glucose range:   129  Mean/median:      Previously:  PRE-MEAL Fasting Lunch Dinner Bedtime  Overall  Glucose range: 125-175    100-175  Mean/median: 141    139   POST-MEAL PC Breakfast PC Lunch PC Dinner  Glucose range:   100-169  Mean/median:   139   PREVIOUSLY  FASTING range from 110-159 with AVERAGE 138 Late evening average 137, range 92-194  Overall AVERAGE 136   Self-care: The diet that the patient has been following is: tries to limit high-fat foods .                  Dietician visit, most recent: none, previously attended classes at cardiac rehabilitation               Weight history: Maximum weight had been 318  Wt Readings from Last 3 Encounters:  10/06/22 257 lb (116.6 kg)  07/07/22 266 lb (120.7 kg)  06/17/22 266 lb (120.7 kg)    Glycemic control:    Lab Results  Component Value Date   HGBA1C 7.8 (H) 09/29/2022   HGBA1C 7.8 (H) 07/05/2022   HGBA1C 7.0 (H) 02/09/2022   Lab Results  Component Value Date   MICROALBUR 1.4 02/09/2022   LDLCALC 22 08/25/2018   CREATININE 1.24 09/29/2022   Lab Results  Component Value Date   MICRALBCREAT 1.0 02/09/2022     Other  active problems: See review of systems   Allergies as of 10/06/2022       Reactions   Augmentin [amoxicillin-pot Clavulanate]    Gi sx   Fluogen [influenza Virus Vaccine]    Other reaction(s): Unknown        Medication List        Accurate as of October 06, 2022  9:01 AM. If you have any questions, ask your nurse or doctor.          aspirin 81 MG tablet Take 81 mg by mouth daily.   CINNAMON PO Take 1,000 capsules by mouth daily.   esomeprazole 40 MG capsule Commonly known as: NEXIUM Take 40 mg by mouth daily at 12 noon.   Farxiga 10 MG Tabs tablet Generic drug: dapagliflozin propanediol TAKE 1 TABLET BY MOUTH EVERY DAY   fexofenadine 180 MG tablet Commonly known as: ALLEGRA Take 180 mg by mouth daily.   fluticasone 50 MCG/ACT nasal spray Commonly known as: FLONASE Place 2 sprays into both nostrils as needed for allergies or rhinitis.   glimepiride 1 MG  tablet Commonly known as: AMARYL TAKE 1 TABLET BY MOUTH DAILY WITH BREAKFAST.   glucose blood test strip Use as instructed to check blood sugar 1 time daily as needed.   OneTouch Verio test strip Generic drug: glucose blood CHECK SUGAR 2 TIMES A DAY.   metFORMIN 1000 MG tablet Commonly known as: GLUCOPHAGE Take 1,000 mg by mouth 2 (two) times daily with a meal.   methocarbamol 500 MG tablet Commonly known as: ROBAXIN Take 500 mg by mouth as needed for muscle spasms.   naproxen 500 MG tablet Commonly known as: NAPROSYN Take 500 mg by mouth as needed.   nitroGLYCERIN 0.4 MG SL tablet Commonly known as: NITROSTAT Place 1 tablet (0.4 mg total) under the tongue every 5 (five) minutes as needed for chest pain.   OneTouch Verio Flex System w/Device Kit Use to check blood sugar 2 times per day   rosuvastatin 5 MG tablet Commonly known as: CRESTOR   tamsulosin 0.4 MG Caps capsule Commonly known as: FLOMAX 1 capsule   Trulicity A999333 0000000 Sopn Generic drug: Dulaglutide Inject 0.75 mg weekly   VITAMIN D PO Take by mouth. 1000 units once daily        Allergies:  Allergies  Allergen Reactions   Augmentin [Amoxicillin-Pot Clavulanate]     Gi sx   Fluogen [Influenza Virus Vaccine]     Other reaction(s): Unknown    Past Medical History:  Diagnosis Date   Allergic rhinitis    Allergy    Basal cell carcinoma 03/19/2015   L BACK, SUPERFICIAL, CX3, CAUTERY, 5FU   Basal cell carcinoma (BCC) 07/14/2021   R Upper Arm - CX3, 5FU   Basal cell carcinoma (BCC) 07/14/2021   L Forearm - CX3, 5FU   CAD (coronary artery disease) 11/2010   WITH LAD DES    Cataract    forming- small    Diabetes ([place]) 2007   Elevated cholesterol    pt denies this 11-11-15- states his cholesterol is fine    GERD (gastroesophageal reflux disease)    Hyperlipidemia    pt states uses Crestor due to Stent - not HLD    Hypertension    MI (mitral incompetence)    SCCA (squamous cell carcinoma)  of skin 07/14/2021   Right Hand - posterior (in situ)   Squamous cell carcinoma of skin 10/27/2009   SCC A/KA, L ELBOW, EXC   Vitamin D  deficiency     Past Surgical History:  Procedure Laterality Date   COLONOSCOPY  03-27-2004   2017- #2   CORONARY ANGIOPLASTY WITH STENT PLACEMENT  11-2010   POLYPECTOMY     VASECTOMY      Family History  Problem Relation Age of Onset   Hepatitis Brother    Diabetes Paternal Grandfather    Heart failure Paternal Grandfather        CHF   Colon cancer Neg Hx    Colon polyps Neg Hx    Esophageal cancer Neg Hx    Rectal cancer Neg Hx    Stomach cancer Neg Hx     Social History:  reports that he has never smoked. He has never used smokeless tobacco. He reports that he does not drink alcohol and does not use drugs.   Review of Systems   Lipid history:  Baseline cholesterol has been normal, on Crestor for history of CAD, only on 5 mg every other day as prescribed by PCP  Followed by PCP, last LDL was 33     Lab Results  Component Value Date   CHOL 75 08/25/2018   HDL 36.80 (L) 08/25/2018   LDLCALC 22 08/25/2018   LDLDIRECT 33.0 09/29/2022   TRIG 79.0 08/25/2018   CHOLHDL 2 08/25/2018           Hypertension: Treated by PCP with valsartan 160 mg previously, this was stopped because of orthostasis  Currently not on any treatment Does monitor regularly at home and readings are usually normal  BP Readings from Last 3 Encounters:  10/06/22 102/74  07/07/22 128/74  06/17/22 120/72    Renal function: This has been recently stable  Has not had any microalbuminuria  Lab Results  Component Value Date   CREATININE 1.24 09/29/2022   CREATININE 1.29 07/05/2022   CREATININE 1.31 (H) 06/02/2022    Has had foot exam with podiatrist and PCP annually  NEUROPATHY: He has mild numbness/decrease in monofilament sensation in his toes for some time    LABS:  No visits with results within 1 Week(s) from this visit.  Latest known visit  with results is:  Lab on 09/29/2022  Component Date Value Ref Range Status   Direct LDL 09/29/2022 33.0  mg/dL Final   Optimal:  <100 mg/dLNear or Above Optimal:  100-129 mg/dLBorderline High:  130-159 mg/dLHigh:  160-189 mg/dLVery High:  >190 mg/dL   Sodium 09/29/2022 137  135 - 145 mEq/L Final   Potassium 09/29/2022 4.4  3.5 - 5.1 mEq/L Final   Chloride 09/29/2022 103  96 - 112 mEq/L Final   CO2 09/29/2022 26  19 - 32 mEq/L Final   Glucose, Bld 09/29/2022 132 (H)  70 - 99 mg/dL Final   BUN 09/29/2022 26 (H)  6 - 23 mg/dL Final   Creatinine, Ser 09/29/2022 1.24  0.40 - 1.50 mg/dL Final   GFR 09/29/2022 60.50  >60.00 mL/min Final   Calculated using the CKD-EPI Creatinine Equation (2021)   Calcium 09/29/2022 10.0  8.4 - 10.5 mg/dL Final   Hgb A1c MFr Bld 09/29/2022 7.8 (H)  4.6 - 6.5 % Final   Glycemic Control Guidelines for People with Diabetes:Non Diabetic:  <6%Goal of Therapy: <7%Additional Action Suggested:  >8%     Physical Examination:  BP 102/74 (BP Location: Left Arm, Patient Position: Sitting, Cuff Size: Normal)   Pulse 76   Ht 6' 4.5" (1.943 m)   Wt 257 lb (116.6 kg)   SpO2 98%   BMI  30.88 kg/m    ASSESSMENT:  Diabetes type 2, non-insulin-dependent  Current regimen is Farxiga 10 mg, Trulicity A999333 mg weekly and Metformin 2 g  See history of present illness for detailed discussion of current diabetes management, blood sugar patterns and problems identified  His A1c is increased at 7.8 again  Previously had done well with Trulicity A999333 which he has just added back  He is just starting to feel the benefits of Trulicity and losing a little weight Also likely will do better with more regular exercise program  HYPERTENSION: Blood pressure has not increased with stopping Diovan  Mild renal dysfunction: Creatinine is stable  Lipids: LDL is low as expected   PLAN:    He will continue Trulicity A999333 mg for now but if his blood sugars are not continuing to improve  further may consider going up to 1.5 mg  Otherwise may give him a trial of Januvia again if this is not too expensive Discussed that he will need to check to see if Trulicity will be better covered with his insurance next year  Periodic readings after lunch also Resume regular walking  Follow-up with PCP regarding lipids   There are no Patient Instructions on file for this visit.      Elayne Snare 10/06/2022, 9:01 AM   Note: This office note was prepared with Dragon voice recognition system technology. Any transcriptional errors that result from this process are unintentional.

## 2022-10-18 ENCOUNTER — Encounter: Payer: Self-pay | Admitting: Endocrinology

## 2022-10-19 ENCOUNTER — Other Ambulatory Visit (HOSPITAL_COMMUNITY): Payer: Self-pay

## 2022-10-19 ENCOUNTER — Telehealth: Payer: Self-pay

## 2022-10-19 ENCOUNTER — Other Ambulatory Visit: Payer: Self-pay | Admitting: Endocrinology

## 2022-10-19 NOTE — Telephone Encounter (Signed)
Pharmacy Patient Advocate Encounter   Received notification from [facility] that prior authorization for TRULICITY is needed.    PA submitted on 10/19/22 VIA BLUE E  Status is pending  Karie Soda, [place] Patient Advocate Specialist Direct Number: [phone] Fax: [phone]

## 2022-10-20 ENCOUNTER — Telehealth: Payer: Self-pay | Admitting: Endocrinology

## 2022-10-20 DIAGNOSIS — E1165 Type 2 diabetes mellitus with hyperglycemia: Secondary | ICD-10-CM

## 2022-10-20 MED ORDER — TRULICITY 0.75 MG/0.5ML ~~LOC~~ SOAJ: SUBCUTANEOUS | 2 refills | Status: DC

## 2022-10-20 NOTE — Telephone Encounter (Signed)
MEDICATION:  Trulicity Dulaglutide (TRULICITY) A999333 0000000 SOPN  PHARMACY:    [id number] - [place], [place] - [address] (Ph: [phone])    HAS THE PATIENT CONTACTED THEIR PHARMACY?  Yes  IS THIS A 90 DAY SUPPLY : Yes  IS PATIENT OUT OF MEDICATION: Needs by tomorrow  IF NOT; HOW MUCH IS LEFT:   LAST APPOINTMENT DATE: @3 /01/2023  NEXT APPOINTMENT DATE:@6 /07/2023  DO WE HAVE YOUR PERMISSION TO LEAVE A DETAILED MESSAGE?: Yes  OTHER COMMENTS: Patient is waiting on an answer about the prior authorization & wants medication today if possible.   **Let patient know to contact pharmacy at the end of the day to make sure medication is ready. **  ** Please notify patient to allow 48-72 hours to process**  **Encourage patient to contact the pharmacy for refills or they can request refills through [facility]**

## 2022-10-20 NOTE — Telephone Encounter (Signed)
Rx now sent 

## 2022-10-20 NOTE — Telephone Encounter (Signed)
Pharmacy Patient Advocate Encounter  Prior Authorization for TRULICITY has been approved.    Effective dates: 10/19/22 through 10/19/23  Karie Soda, [place] Patient Advocate Specialist Direct Number: [phone] Fax: [phone]

## 2022-11-08 DIAGNOSIS — E669 Obesity, unspecified: Secondary | ICD-10-CM | POA: Diagnosis not present

## 2022-11-08 DIAGNOSIS — Z125 Encounter for screening for malignant neoplasm of prostate: Secondary | ICD-10-CM | POA: Diagnosis not present

## 2022-11-08 DIAGNOSIS — E1122 Type 2 diabetes mellitus with diabetic chronic kidney disease: Secondary | ICD-10-CM | POA: Diagnosis not present

## 2022-11-08 DIAGNOSIS — E785 Hyperlipidemia, unspecified: Secondary | ICD-10-CM | POA: Diagnosis not present

## 2022-11-08 DIAGNOSIS — I10 Essential (primary) hypertension: Secondary | ICD-10-CM | POA: Diagnosis not present

## 2022-11-16 DIAGNOSIS — E559 Vitamin D deficiency, unspecified: Secondary | ICD-10-CM | POA: Diagnosis not present

## 2022-11-16 DIAGNOSIS — Z0000 Encounter for general adult medical examination without abnormal findings: Secondary | ICD-10-CM | POA: Diagnosis not present

## 2022-11-16 DIAGNOSIS — N401 Enlarged prostate with lower urinary tract symptoms: Secondary | ICD-10-CM | POA: Diagnosis not present

## 2022-11-16 DIAGNOSIS — E1122 Type 2 diabetes mellitus with diabetic chronic kidney disease: Secondary | ICD-10-CM | POA: Diagnosis not present

## 2022-11-16 DIAGNOSIS — K219 Gastro-esophageal reflux disease without esophagitis: Secondary | ICD-10-CM | POA: Diagnosis not present

## 2022-12-05 ENCOUNTER — Other Ambulatory Visit: Payer: Self-pay | Admitting: Endocrinology

## 2022-12-06 ENCOUNTER — Ambulatory Visit: Payer: Medicare Other | Admitting: Dermatology

## 2022-12-22 DIAGNOSIS — L570 Actinic keratosis: Secondary | ICD-10-CM | POA: Diagnosis not present

## 2022-12-22 DIAGNOSIS — D225 Melanocytic nevi of trunk: Secondary | ICD-10-CM | POA: Diagnosis not present

## 2022-12-22 DIAGNOSIS — X32XXXA Exposure to sunlight, initial encounter: Secondary | ICD-10-CM | POA: Diagnosis not present

## 2022-12-22 DIAGNOSIS — L720 Epidermal cyst: Secondary | ICD-10-CM | POA: Diagnosis not present

## 2023-01-03 ENCOUNTER — Other Ambulatory Visit: Payer: Self-pay | Admitting: Endocrinology

## 2023-01-10 ENCOUNTER — Other Ambulatory Visit (INDEPENDENT_AMBULATORY_CARE_PROVIDER_SITE_OTHER): Payer: Medicare Other

## 2023-01-10 DIAGNOSIS — E1165 Type 2 diabetes mellitus with hyperglycemia: Secondary | ICD-10-CM | POA: Diagnosis not present

## 2023-01-10 LAB — HEMOGLOBIN A1C: Hgb A1c MFr Bld: 6.8 % — ABNORMAL HIGH (ref 4.6–6.5)

## 2023-01-10 LAB — BASIC METABOLIC PANEL
BUN: 17 mg/dL (ref 6–23)
CO2: 23 mEq/L (ref 19–32)
Calcium: 9.2 mg/dL (ref 8.4–10.5)
Chloride: 105 mEq/L (ref 96–112)
Creatinine, Ser: 1.08 mg/dL (ref 0.40–1.50)
GFR: 71.26 mL/min (ref >60.00)
Glucose, Bld: 106 mg/dL — ABNORMAL HIGH (ref 70–99)
Potassium: 4.2 mEq/L (ref 3.5–5.1)
Sodium: 138 mEq/L (ref 135–145)

## 2023-01-12 ENCOUNTER — Ambulatory Visit: Payer: Medicare Other | Admitting: Endocrinology

## 2023-01-12 ENCOUNTER — Encounter: Payer: Self-pay | Admitting: Endocrinology

## 2023-01-12 VITALS — BP 119/78 | HR 73 | Ht 76.0 in | Wt 259.4 lb

## 2023-01-12 DIAGNOSIS — E1165 Type 2 diabetes mellitus with hyperglycemia: Secondary | ICD-10-CM

## 2023-01-12 DIAGNOSIS — Z7985 Long-term (current) use of injectable non-insulin antidiabetic drugs: Secondary | ICD-10-CM | POA: Diagnosis not present

## 2023-01-12 MED ORDER — ONETOUCH VERIO VI STRP: ORAL_STRIP | 3 refills | Status: AC

## 2023-01-12 NOTE — Progress Notes (Signed)
Patient ID: Henry Rivera, male   DOB: [birth year], 67 y.o.   MRN: [id number]            Reason for Appointment: Follow-up visit   History of Present Illness:          Date of diagnosis of type 2 diabetes mellitus: 2007      Background history:   His A1c was apparently 12.7 at the time of diagnosis when he had increased thirst and urination He was started on Janumet and apparently continued unchanged until last year, this was stopped because of high out-of-pocket expense He was subsequently on metformin only and then Jardiance 10 mg started His A1c in 2017 was 6.7 twice, previous records not available.  Recent history:       Non-insulin hypoglycemic drugs the patient is taking are: Farxiga 10 mg, metformin 1 g twice a day, Trulicity 0.75 mg weekly  His A1c is 6.9 compared to 7.8   Current management, blood sugar patterns and problems identified:  He had not been able to afford his Trulicity because of cost last year and A1c had gone up; now with staying on this since February his blood sugars have progressively improved He has only mildly increased blood sugars in the morning but these are variable Again does not have as many readings after dinner He is doing about 2 miles walking recently with recovering from his knee issue As before he is doing very well with his diet usually Renal function still normal with continuing [place] which she takes daily  Side effects from medications have been: Nausea from high-dose Trulicity, diarrhea from glimepiride  Compliance with the medical regimen: Usually good  Glucose monitoring:  done 1-2 times a day         Glucometer: One Touch   .       PRE-MEAL Fasting Lunch Dinner Bedtime Overall  Glucose range: 114-140      Mean/median: 125    119   POST-MEAL PC Breakfast PC Lunch PC Dinner  Glucose range:   73-126  Mean/median:   1071   PREVIOUSLY: 14-day average 130, previously 144 by patient history  Self-care: The diet that  the patient has been following is: tries to limit high-fat foods .                  Dietician visit, most recent: none, previously attended classes at cardiac rehabilitation               Weight history: Maximum weight had been 318  Wt Readings from Last 3 Encounters:  01/12/23 259 lb 6.4 oz (117.7 kg)  10/06/22 257 lb (116.6 kg)  07/07/22 266 lb (120.7 kg)    Glycemic control:    Lab Results  Component Value Date   HGBA1C 6.8 (H) 01/10/2023   HGBA1C 7.8 (H) 09/29/2022   HGBA1C 7.8 (H) 07/05/2022   Lab Results  Component Value Date   MICROALBUR 1.4 02/09/2022   LDLCALC 22 08/25/2018   CREATININE 1.08 01/10/2023   Lab Results  Component Value Date   MICRALBCREAT 1.0 02/09/2022     Other active problems: See review of systems   Allergies as of 01/12/2023       Reactions   Augmentin [amoxicillin-pot Clavulanate]    Gi sx   Fluogen [influenza Virus Vaccine]    Other reaction(s): Unknown        Medication List        Accurate as of January 12, 2023  8:48 AM. If you have any questions, ask your nurse or doctor.          STOP taking these medications    glimepiride 1 MG tablet Commonly known as: AMARYL Stopped by: Reather Littler, MD   naproxen 500 MG tablet Commonly known as: NAPROSYN Stopped by: Reather Littler, MD       TAKE these medications    aspirin 81 MG tablet Take 81 mg by mouth daily.   CINNAMON PO Take 1,000 capsules by mouth daily.   esomeprazole 40 MG capsule Commonly known as: NEXIUM Take 40 mg by mouth daily at 12 noon.   Farxiga 10 MG Tabs tablet Generic drug: dapagliflozin propanediol TAKE 1 TABLET BY MOUTH EVERY DAY   fexofenadine 180 MG tablet Commonly known as: ALLEGRA Take 180 mg by mouth daily.   fluticasone 50 MCG/ACT nasal spray Commonly known as: FLONASE Place 2 sprays into both nostrils as needed for allergies or rhinitis.   glucose blood test strip Use as instructed to check blood sugar 1 time daily as needed.    OneTouch Verio test strip Generic drug: glucose blood CHECK SUGAR 2 TIMES A DAY.   metFORMIN 1000 MG tablet Commonly known as: GLUCOPHAGE Take 1,000 mg by mouth 2 (two) times daily with a meal.   methocarbamol 500 MG tablet Commonly known as: ROBAXIN Take 500 mg by mouth as needed for muscle spasms.   nitroGLYCERIN 0.4 MG SL tablet Commonly known as: NITROSTAT Place 1 tablet (0.4 mg total) under the tongue every 5 (five) minutes as needed for chest pain.   OneTouch Verio Flex System w/Device Kit Use to check blood sugar 2 times per day   rosuvastatin 5 MG tablet Commonly known as: CRESTOR   tamsulosin 0.4 MG Caps capsule Commonly known as: FLOMAX 1 capsule   Trulicity 0.75 MG/0.5ML Sopn Generic drug: Dulaglutide Inject 0.75 mg weekly   VITAMIN D PO Take by mouth. 1000 units once daily        Allergies:  Allergies  Allergen Reactions   Augmentin [Amoxicillin-Pot Clavulanate]     Gi sx   Fluogen [Influenza Virus Vaccine]     Other reaction(s): Unknown    Past Medical History:  Diagnosis Date   Allergic rhinitis    Allergy    Basal cell carcinoma 03/19/2015   L BACK, SUPERFICIAL, CX3, CAUTERY, 5FU   Basal cell carcinoma (BCC) 07/14/2021   R Upper Arm - CX3, 5FU   Basal cell carcinoma (BCC) 07/14/2021   L Forearm - CX3, 5FU   CAD (coronary artery disease) 11/2010   WITH LAD DES    Cataract    forming- small    Diabetes (HCC) 2007   Elevated cholesterol    pt denies this 11-11-15- states his cholesterol is fine    GERD (gastroesophageal reflux disease)    Hyperlipidemia    pt states uses Crestor due to Stent - not HLD    Hypertension    MI (mitral incompetence)    SCCA (squamous cell carcinoma) of skin 07/14/2021   Right Hand - posterior (in situ)   Squamous cell carcinoma of skin 10/27/2009   SCC A/KA, L ELBOW, EXC   Vitamin D deficiency     Past Surgical History:  Procedure Laterality Date   COLONOSCOPY  03-27-2004   2017- #2   CORONARY  ANGIOPLASTY WITH STENT PLACEMENT  11-2010   POLYPECTOMY     VASECTOMY      Family History  Problem Relation Age of Onset  Hepatitis Brother    Diabetes Paternal Grandfather    Heart failure Paternal Grandfather        CHF   Colon cancer Neg Hx    Colon polyps Neg Hx    Esophageal cancer Neg Hx    Rectal cancer Neg Hx    Stomach cancer Neg Hx     Social History:  reports that he has never smoked. He has never used smokeless tobacco. He reports that he does not drink alcohol and does not use drugs.   Review of Systems   Lipid history:  Baseline cholesterol has been normal, on Crestor for history of CAD, only on 5 mg every other day as prescribed by PCP  Followed by PCP, last LDL was 33    Lab Results  Component Value Date   CHOL 75 08/25/2018   HDL 36.80 (L) 08/25/2018   LDLCALC 22 08/25/2018   LDLDIRECT 33.0 09/29/2022   TRIG 79.0 08/25/2018   CHOLHDL 2 08/25/2018           Hypertension: Previously treated with valsartan but not requiring any treatment On Farxiga  Does monitor regularly at home  BP Readings from Last 3 Encounters:  01/12/23 119/78  10/06/22 132/80  07/07/22 128/74   Renal function: This has been stable  Has not had any microalbuminuria  Lab Results  Component Value Date   CREATININE 1.08 01/10/2023   CREATININE 1.24 09/29/2022   CREATININE 1.29 07/05/2022    Has had foot exam with podiatrist and PCP annually  NEUROPATHY: He has mild numbness/decrease in monofilament sensation in his toes for some time    LABS:  Lab on 01/10/2023  Component Date Value Ref Range Status   Sodium 01/10/2023 138  135 - 145 mEq/L Final   Potassium 01/10/2023 4.2  3.5 - 5.1 mEq/L Final   Chloride 01/10/2023 105  96 - 112 mEq/L Final   CO2 01/10/2023 23  19 - 32 mEq/L Final   Glucose, Bld 01/10/2023 106 (H)  70 - 99 mg/dL Final   BUN 82/95/6213 17  6 - 23 mg/dL Final   Creatinine, Ser 01/10/2023 1.08  0.40 - 1.50 mg/dL Final   GFR 08/65/7846 71.26   >60.00 mL/min Final   Calculated using the CKD-EPI Creatinine Equation (2021)   Calcium 01/10/2023 9.2  8.4 - 10.5 mg/dL Final   Hgb N6E MFr Bld 01/10/2023 6.8 (H)  4.6 - 6.5 % Final   Glycemic Control Guidelines for People with Diabetes:Non Diabetic:  <6%Goal of Therapy: <7%Additional Action Suggested:  >8%     Physical Examination:  BP 119/78 (BP Location: Left Arm, Patient Position: Sitting)   Pulse 73   Ht 6\' 4"  (1.93 m)   Wt 259 lb 6.4 oz (117.7 kg)   SpO2 98%   BMI 31.58 kg/m    ASSESSMENT:  Diabetes type 2, non-insulin-dependent  Current regimen is Farxiga 10 mg, Trulicity 0.75 mg weekly and Metformin 2 g  See history of present illness for detailed discussion of current diabetes management, blood sugar patterns and problems identified  His A1c is 6.8, previously was at 7.8   Doing very well with Trulicity 0.75 with excellent blood sugars at home Blood sugars were reviewed  He is still doing a healthy diet and starting to exercise with walking  Renal function quite normal with continuing Farxiga which may be helping blood pressure also  PLAN:    He will continue Trulicity 0.75 mg weekly More frequent blood sugar monitoring after breakfast or lunch  Continue increasing exercise with walking as tolerated To call if he has any difficulties with blood sugars not being controlled   There are no Patient Instructions on file for this visit.    Reather Littler 01/12/2023, 8:48 AM   Note: This office note was prepared with Dragon voice recognition system technology. Any transcriptional errors that result from this process are unintentional.

## 2023-01-27 DIAGNOSIS — M2041 Other hammer toe(s) (acquired), right foot: Secondary | ICD-10-CM | POA: Diagnosis not present

## 2023-01-27 DIAGNOSIS — M2011 Hallux valgus (acquired), right foot: Secondary | ICD-10-CM | POA: Diagnosis not present

## 2023-01-28 ENCOUNTER — Encounter: Payer: Self-pay | Admitting: Endocrinology

## 2023-01-28 DIAGNOSIS — H25813 Combined forms of age-related cataract, bilateral: Secondary | ICD-10-CM | POA: Diagnosis not present

## 2023-01-28 DIAGNOSIS — H02834 Dermatochalasis of left upper eyelid: Secondary | ICD-10-CM | POA: Diagnosis not present

## 2023-01-28 DIAGNOSIS — H524 Presbyopia: Secondary | ICD-10-CM | POA: Diagnosis not present

## 2023-01-28 DIAGNOSIS — E119 Type 2 diabetes mellitus without complications: Secondary | ICD-10-CM | POA: Diagnosis not present

## 2023-01-28 DIAGNOSIS — H02831 Dermatochalasis of right upper eyelid: Secondary | ICD-10-CM | POA: Diagnosis not present

## 2023-01-28 LAB — HM DIABETES EYE EXAM

## 2023-02-24 ENCOUNTER — Encounter: Payer: Self-pay | Admitting: Endocrinology

## 2023-03-31 DIAGNOSIS — M2041 Other hammer toe(s) (acquired), right foot: Secondary | ICD-10-CM | POA: Diagnosis not present

## 2023-05-03 DIAGNOSIS — E119 Type 2 diabetes mellitus without complications: Secondary | ICD-10-CM | POA: Diagnosis not present

## 2023-06-03 DIAGNOSIS — R1312 Dysphagia, oropharyngeal phase: Secondary | ICD-10-CM | POA: Diagnosis not present

## 2023-06-03 DIAGNOSIS — J040 Acute laryngitis: Secondary | ICD-10-CM | POA: Diagnosis not present

## 2023-06-03 DIAGNOSIS — E119 Type 2 diabetes mellitus without complications: Secondary | ICD-10-CM | POA: Diagnosis not present

## 2023-06-03 DIAGNOSIS — I129 Hypertensive chronic kidney disease with stage 1 through stage 4 chronic kidney disease, or unspecified chronic kidney disease: Secondary | ICD-10-CM | POA: Diagnosis not present

## 2023-06-22 DIAGNOSIS — L570 Actinic keratosis: Secondary | ICD-10-CM | POA: Diagnosis not present

## 2023-06-22 DIAGNOSIS — L820 Inflamed seborrheic keratosis: Secondary | ICD-10-CM | POA: Diagnosis not present

## 2023-06-22 DIAGNOSIS — X32XXXD Exposure to sunlight, subsequent encounter: Secondary | ICD-10-CM | POA: Diagnosis not present

## 2023-07-11 ENCOUNTER — Ambulatory Visit: Payer: Medicare Other | Admitting: Gastroenterology

## 2023-07-11 ENCOUNTER — Encounter: Payer: Self-pay | Admitting: Gastroenterology

## 2023-07-11 VITALS — BP 130/82 | HR 66 | Ht 76.0 in | Wt 270.0 lb

## 2023-07-11 DIAGNOSIS — R1310 Dysphagia, unspecified: Secondary | ICD-10-CM | POA: Diagnosis not present

## 2023-07-11 DIAGNOSIS — K219 Gastro-esophageal reflux disease without esophagitis: Secondary | ICD-10-CM

## 2023-07-11 DIAGNOSIS — R499 Unspecified voice and resonance disorder: Secondary | ICD-10-CM | POA: Diagnosis not present

## 2023-07-11 DIAGNOSIS — R053 Chronic cough: Secondary | ICD-10-CM

## 2023-07-11 MED ORDER — ESOMEPRAZOLE MAGNESIUM 40 MG PO CPDR: 40.0000 mg | DELAYED_RELEASE_CAPSULE | Freq: Two times a day (BID) | ORAL | 1 refills | Status: DC

## 2023-07-11 NOTE — Progress Notes (Signed)
Chief Complaint: Dysphagia/Hoarseness Primary GI MD: Dr. Adela Lank  HPI: 67 year old male history of CAD s/p PCI to ZOX(0960), diabetes, GERD, hyperlipidemia, presents for evaluation of dysphagia and change in voice.  History of adenomatous polyps on last colonoscopy 01/2019.  Due for repeat 01/2024.  CBC 06/2022-normal.  BMP 01/2023-normal  Discussed the use of AI scribe software for clinical note transcription with the patient, who gave verbal consent to proceed.  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 The dysphagia is described as a choking sensation, occurring approximately two to three times a week, triggered by swallowing saliva, food, or liquids. The patient notes a sensation of something persistently lodged in the throat, localized around the suprasternal notch.   The voice changes are characterized by fluctuating volume and a shift in vocal range, which is particularly concerning given the patient's professional need for consistent vocal function.  The patient has a long-standing history of reflux disease, managed with Nexium, and denies any associated heartburn symptoms. The patient's bowel habits are reportedly regular and well-formed.   The patient denies any tobacco use and reports occasional use of ibuprofen and Aleve.      PREVIOUS GI WORKUP   Colonoscopy 01/30/2019 history of colon polyps - One diminutive polyp in the cecum, removed with a cold snare. Resected and retrieved.  - One 3 mm polyp in the ascending colon, removed with a cold snare. Resected and retrieved.  - Internal hemorrhoids.  - Diverticulosis in the sigmoid colon.  - The examination was otherwise normal. -Repeat 01/2024  Surgical [P], transverse and cecum, polyps (2) - TUBULAR ADENOMA(S). - SESSILE SERRATED POLYP WITHOUT CYTOLOGIC  DYSPLASIA. - HIGH GRADE DYSPLASIA IS NOT IDENTIFIED.  Colonoscopy 11/2015 - Two 3 to 6 mm polyps in the cecum, removed with a cold snare. Resected and retrieved.  - One 4 mm polyp in the ascending colon, removed with a cold snare. Resected and retrieved.  - One 4 mm polyp at the recto- sigmoid colon, removed with a cold snare. Resected and retrieved.  - Diverticulosis in the left colon.  - Non- bleeding internal hemorrhoids. - repeat 3 years  Surgical [P], cecal, ascending, recto-sig, polyp (4) - TUBULAR ADENOMA. - SESSILE SERRATED ADENOMA(S) WITHOUT CYTOLOGIC DYSPLASIA. - HIGH GRADE DYSPLASIA IS NOT IDENTIFIED.   Past Medical History:  Diagnosis Date   Allergic rhinitis    Allergy    Basal cell carcinoma 03/19/2015   L BACK, SUPERFICIAL, CX3, CAUTERY, 5FU   Basal cell carcinoma (BCC) 07/14/2021   R Upper Arm - CX3, 5FU   Basal cell carcinoma (BCC) 07/14/2021   L Forearm - CX3, 5FU   CAD (coronary artery disease) 11/2010   WITH LAD DES    Cataract    forming- small    Diabetes (HCC) 2007   Elevated cholesterol    pt denies this 11-11-15- states his cholesterol is fine    GERD (gastroesophageal reflux disease)    Hyperlipidemia    pt states uses Crestor due to Stent - not HLD    Hypertension    MI (mitral incompetence)    SCCA (squamous cell carcinoma) of skin 07/14/2021   Right Hand - posterior (in situ)   Squamous cell carcinoma of skin 10/27/2009   SCC A/KA, L ELBOW, EXC   Vitamin D deficiency     Past Surgical History:  Procedure Laterality Date   COLONOSCOPY  03-27-2004   2017- #2   CORONARY ANGIOPLASTY WITH STENT PLACEMENT  11-2010   POLYPECTOMY     VASECTOMY      Current Outpatient Medications  Medication Sig Dispense Refill   aspirin 81 MG tablet Take 81 mg by mouth daily.     Blood Glucose Monitoring Suppl (ONETOUCH VERIO FLEX SYSTEM) w/Device KIT Use to check blood sugar 2 times per day 1 kit 1   Cholecalciferol (VITAMIN D PO) Take by mouth. 1000 units  once daily     CINNAMON PO Take 1,000 capsules by mouth daily.     esomeprazole (NEXIUM) 40 MG capsule Take 40 mg by mouth daily at 12 noon.     FARXIGA 10 MG TABS tablet TAKE 1 TABLET BY MOUTH EVERY DAY 30 tablet 3   fexofenadine (ALLEGRA) 180 MG tablet Take 180 mg by mouth daily.     fluticasone (FLONASE) 50 MCG/ACT nasal spray Place 2 sprays into both nostrils as needed for allergies or rhinitis.     glucose blood (ONETOUCH VERIO) test strip Use as instructed 2x daily for Dx E11.65 100 strip 3   metFORMIN (GLUCOPHAGE) 1000 MG tablet Take 1,000 mg by mouth 2 (two) times daily with a meal.     methocarbamol (ROBAXIN) 500 MG tablet Take 500 mg by mouth as needed for muscle spasms.     nitroGLYCERIN (NITROSTAT) 0.4 MG SL tablet Place 1 tablet (0.4 mg total) under the tongue every 5 (five) minutes as needed for chest pain. 25 tablet 6   rosuvastatin (CRESTOR) 5 MG tablet   1   SitaGLIPtin-MetFORMIN HCl (JANUMET XR) 50-1000 MG TB24 1 tablet Orally twice daily for 30 days     tamsulosin (FLOMAX) 0.4 MG CAPS capsule 1 capsule     No current facility-administered medications for this visit.    Allergies as of 07/11/2023 - Review Complete 07/11/2023  Allergen Reaction Noted   Augmentin [amoxicillin-pot clavulanate]  01/31/2014   Fluogen [influenza virus vaccine]  05/08/2021    Family History  Problem Relation Age of Onset   Hepatitis Brother    Diabetes Paternal Grandfather    Heart failure Paternal Grandfather        CHF   Colon cancer Neg Hx    Colon polyps Neg Hx    Esophageal cancer Neg Hx    Rectal cancer Neg Hx    Stomach cancer Neg Hx     Social History   Socioeconomic History   Marital status: Married    Spouse name: Not on file   Number of children: Not on file   Years of education: Not on file   Highest education level: Not on file  Occupational History   Not on file  Tobacco Use   Smoking status: Never   Smokeless tobacco: Never  Vaping Use   Vaping status: Never  Used  Substance and Sexual Activity   Alcohol use: No    Alcohol/week: 0.0 standard drinks of alcohol   Drug use: No   Sexual activity: Not on file  Other Topics Concern   Not on file  Social History Narrative   Not on file   Social Determinants of Health   Financial Resource Strain: Not on file  Food Insecurity: Not on file  Transportation Needs: Not on file  Physical Activity: Not on file  Stress: Not on file  Social Connections: Not on file  Intimate Partner Violence: Not on file    Review of  Systems:    Constitutional: No weight loss, fever, chills, weakness or fatigue HEENT: Eyes: No change in vision               Ears, Nose, Throat:  No change in hearing or congestion Skin: No rash or itching Cardiovascular: No chest pain, chest pressure or palpitations   Respiratory: No SOB or cough Gastrointestinal: See HPI and otherwise negative Genitourinary: No dysuria or change in urinary frequency Neurological: No headache, dizziness or syncope Musculoskeletal: No new muscle or joint pain Hematologic: No bleeding or bruising Psychiatric: No history of depression or anxiety    Physical Exam:  Vital signs: BP 130/82   Pulse 66   Ht 6\' 4"  (1.93 m)   Wt 270 lb (122.5 kg)   SpO2 97%   BMI 32.87 kg/m   Constitutional: NAD, Well developed, Well nourished, alert and cooperative Head:  Normocephalic and atraumatic. Eyes:   PEERL, EOMI. No icterus. Conjunctiva pink. Respiratory: Respirations even and unlabored. Lungs clear to auscultation bilaterally.   No wheezes, crackles, or rhonchi.  Cardiovascular:  Regular rate and rhythm. No peripheral edema, cyanosis or pallor.  Gastrointestinal:  Soft, nondistended, nontender. No rebound or guarding. Normal bowel sounds. No appreciable masses or hepatomegaly. Rectal:  Not performed.  Msk:  Symmetrical without gross deformities. Without edema, no deformity or joint abnormality.  Neurologic:  Alert and  oriented x4;  grossly normal  neurologically.  Skin:   Dry and intact without significant lesions or rashes. Psychiatric: Oriented to person, place and time. Demonstrates good judgement and reason without abnormal affect or behaviors.  RELEVANT LABS AND IMAGING: CBC    Component Value Date/Time   WBC 7.6 06/02/2022 0641   RBC 5.43 06/02/2022 0641   HGB 15.1 06/02/2022 0641   HCT 46.5 06/02/2022 0641   PLT 162 06/02/2022 0641   MCV 85.6 06/02/2022 0641   MCH 27.8 06/02/2022 0641   MCHC 32.5 06/02/2022 0641   RDW 14.7 06/02/2022 0641   LYMPHSABS 1.2 06/02/2022 0641   MONOABS 0.6 06/02/2022 0641   EOSABS 0.2 06/02/2022 0641   BASOSABS 0.1 06/02/2022 0641    CMP     Component Value Date/Time   NA 138 01/10/2023 0813   NA 141 04/06/2018 0000   K 4.2 01/10/2023 0813   CL 105 01/10/2023 0813   CO2 23 01/10/2023 0813   GLUCOSE 106 (H) 01/10/2023 0813   BUN 17 01/10/2023 0813   BUN 15 04/06/2018 0000   CREATININE 1.08 01/10/2023 0813   CALCIUM 9.2 01/10/2023 0813   PROT 7.2 12/16/2020 0758   ALBUMIN 4.5 12/16/2020 0758   AST 21 12/16/2020 0758   ALT 33 12/16/2020 0758   ALKPHOS 49 12/16/2020 0758   BILITOT 0.7 12/16/2020 0758   GFRNONAA >60 06/02/2022 0641   GFRAA 73 11/08/2017 0812     Assessment/Plan:      Dysphagia Intermittent choking sensation with both solids and liquids, including saliva, occurring 2-3 times per week.  Constant feeling of something in his throat, possible globus sensation.  No associated heartburn (heartburn well-controlled on long-term Nexium). Longstanding history of reflux disease managed with Nexium 40mg  once daily. - EGD with possible dilation rule out stricture, masses, other structural abnormalities.  Also can evaluate for esophagitis, gastritis, PUD - Increase Nexium to twice daily -- I thoroughly discussed the procedure with the patient (at bedside) to include nature of the procedure, alternatives, benefits, and risks (including but not limited to bleeding, infection,  perforation, anesthesia/cardiac pulmonary complications).  Patient  verbalized understanding and gave verbal consent to proceed with procedure.  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 Possible silent reflux or globus sensation. -Continue increased Nexium dose. -Consider referral to ENT if endoscopy does not reveal a GI source.  General Health Maintenance Colonoscopy due in June 2025. -Remind patient closer to due date, discuss Sutab pill prep option per patient request.      Lara Mulch [facility] 07/11/2023, 10:15 AM  Cc: Linus Galas, NP

## 2023-07-11 NOTE — Patient Instructions (Addendum)
Today, we discussed your recent issues with swallowing and voice changes, as well as your ongoing management of diabetes and general health maintenance. We have outlined a plan to address each of these concerns and ensure you receive the appropriate care moving forward.  YOUR PLAN:  -DYSPHAGIA: Dysphagia is difficulty swallowing. You have been experiencing a choking sensation when swallowing saliva, food, or liquids. We will schedule an endoscopy within the next couple of weeks to check for any narrowing or inflammation in your esophagus. Additionally, you should increase your Nexium dose to twice daily, taking it 30 minutes before meals in the morning and at night.  -HOARSENESS: Hoarseness refers to changes in your voice, which can be due to various causes including silent reflux or a sensation of something stuck in your throat. Continue with the increased Nexium dose. If the endoscopy does not show a gastrointestinal cause, we may refer you to an ENT specialist for further evaluation.  -GENERAL HEALTH MAINTENANCE: Your next colonoscopy is due in June 2025. We will remind you closer to the due date and discuss the option of using the Sutab pill prep for the procedure.  INSTRUCTIONS:  Increase your Nexium 1 pill by mouth twice daily.   You have been scheduled for an endoscopy. Please follow written instructions given to you at your visit today.  If you use inhalers (even only as needed), please bring them with you on the day of your procedure.  If you take any of the following medications, they will need to be adjusted prior to your procedure:   DO NOT TAKE 7 DAYS PRIOR TO TEST- Trulicity (dulaglutide) Ozempic, Wegovy (semaglutide) Mounjaro (tirzepatide) Bydureon Bcise (exanatide extended release)  DO NOT TAKE 1 DAY PRIOR TO YOUR TEST Rybelsus (semaglutide) Adlyxin (lixisenatide) Victoza (liraglutide) Byetta  (exanatide) ___________________________________________________________________________    Due to recent changes in healthcare laws, you may see the results of your imaging and laboratory studies on MyChart before your provider has had a chance to review them.  We understand that in some cases there may be results that are confusing or concerning to you. Not all laboratory results come back in the same time frame and the provider may be waiting for multiple results in order to interpret others.  Please give [place] 48 hours in order for your provider to thoroughly review all the results before contacting the office for clarification of your results.   Thank you for choosing me and [facility].  Boone Master, PA-C

## 2023-07-12 NOTE — Progress Notes (Signed)
Agree with assessment and plan as outlined. Bayley I noted this patient is scheduled at the end of January. I have openings next week and this week if he wants to do it sooner. Thanks

## 2023-07-13 NOTE — Progress Notes (Signed)
Called patient for earlier appointment offer. No answer.

## 2023-08-12 DIAGNOSIS — M25511 Pain in right shoulder: Secondary | ICD-10-CM | POA: Diagnosis not present

## 2023-08-12 DIAGNOSIS — E1122 Type 2 diabetes mellitus with diabetic chronic kidney disease: Secondary | ICD-10-CM | POA: Diagnosis not present

## 2023-08-15 ENCOUNTER — Ambulatory Visit (HOSPITAL_BASED_OUTPATIENT_CLINIC_OR_DEPARTMENT_OTHER): Payer: Medicare Other | Admitting: Cardiovascular Disease

## 2023-08-21 ENCOUNTER — Encounter: Payer: Self-pay | Admitting: Certified Registered Nurse Anesthetist

## 2023-08-26 ENCOUNTER — Ambulatory Visit (AMBULATORY_SURGERY_CENTER): Payer: Medicare Other | Admitting: Gastroenterology

## 2023-08-26 ENCOUNTER — Encounter: Payer: Self-pay | Admitting: Gastroenterology

## 2023-08-26 VITALS — BP 123/87 | HR 88 | Temp 98.1°F | Resp 12 | Ht 76.0 in | Wt 270.0 lb

## 2023-08-26 DIAGNOSIS — K449 Diaphragmatic hernia without obstruction or gangrene: Secondary | ICD-10-CM

## 2023-08-26 DIAGNOSIS — R499 Unspecified voice and resonance disorder: Secondary | ICD-10-CM

## 2023-08-26 DIAGNOSIS — K219 Gastro-esophageal reflux disease without esophagitis: Secondary | ICD-10-CM

## 2023-08-26 DIAGNOSIS — I10 Essential (primary) hypertension: Secondary | ICD-10-CM | POA: Diagnosis not present

## 2023-08-26 DIAGNOSIS — R1310 Dysphagia, unspecified: Secondary | ICD-10-CM | POA: Diagnosis not present

## 2023-08-26 DIAGNOSIS — E119 Type 2 diabetes mellitus without complications: Secondary | ICD-10-CM | POA: Diagnosis not present

## 2023-08-26 MED ORDER — SODIUM CHLORIDE 0.9 % IV SOLN: 500.0000 mL | INTRAVENOUS | Status: DC

## 2023-08-26 NOTE — Progress Notes (Signed)
Report given to PACU, vss

## 2023-08-26 NOTE — Progress Notes (Signed)
Robinul 0.1 mg IV given due large amount of secretions upon assessment.  MD made aware, vss

## 2023-08-26 NOTE — Progress Notes (Signed)
[facility] History and Physical   Primary Care Physician:  Merri Brunette, MD   Reason for Procedure:   Dysphagia, change in voice / hoarseness, history of GERD  Plan:    EGD with possible dilation     HPI: Henry Rivera is a 68 y.o. male  here for EGD with possible dilation - seen in the office 07/11/23 - history of GERD on nexium who has developed dysphagia, globus, voice change. EGD to further evaluate. No prior endoscopy.    Otherwise feels well without any cardiopulmonary symptoms.   I have discussed risks / benefits of anesthesia and endoscopic procedure with Kavin Leech and they wish to proceed with the exams as outlined today.    Past Medical History:  Diagnosis Date   Allergic rhinitis    Allergy    Basal cell carcinoma 03/19/2015   L BACK, SUPERFICIAL, CX3, CAUTERY, 5FU   Basal cell carcinoma (BCC) 07/14/2021   R Upper Arm - CX3, 5FU   Basal cell carcinoma (BCC) 07/14/2021   L Forearm - CX3, 5FU   CAD (coronary artery disease) 11/2010   WITH LAD DES    Cataract    forming- small    Diabetes (HCC) 2007   GERD (gastroesophageal reflux disease)    Hyperlipidemia    pt states uses Crestor due to Stent - not HLD    Hypertension    MI (mitral incompetence)    SCCA (squamous cell carcinoma) of skin 07/14/2021   Right Hand - posterior (in situ)   Squamous cell carcinoma of skin 10/27/2009   SCC A/KA, L ELBOW, EXC   Vitamin D deficiency     Past Surgical History:  Procedure Laterality Date   COLONOSCOPY  03-27-2004   2017- #2   CORONARY ANGIOPLASTY WITH STENT PLACEMENT  11-2010   POLYPECTOMY     VASECTOMY      Prior to Admission medications   Medication Sig Start Date End Date Taking? Authorizing Provider  aspirin 81 MG tablet Take 81 mg by mouth daily.   Yes [provider]  Blood Glucose Monitoring Suppl (ONETOUCH VERIO FLEX SYSTEM) w/Device KIT Use to check blood sugar 2 times per day 07/17/20  Yes Reather Littler, MD   Cholecalciferol (VITAMIN D PO) Take by mouth. 1000 units once daily   Yes [provider]  CINNAMON PO Take 1,000 capsules by mouth daily.   Yes [provider]  esomeprazole (NEXIUM) 40 MG capsule Take 1 capsule (40 mg total) by mouth 2 (two) times daily. 07/11/23  Yes McMichael, Bayley M, PA-C  FARXIGA 10 MG TABS tablet TAKE 1 TABLET BY MOUTH EVERY DAY 01/03/23  Yes Reather Littler, MD  fexofenadine (ALLEGRA) 180 MG tablet Take 180 mg by mouth daily.   Yes [provider]  fluticasone (FLONASE) 50 MCG/ACT nasal spray Place 2 sprays into both nostrils as needed for allergies or rhinitis.   Yes [provider]  glucose blood (ONETOUCH VERIO) test strip Use as instructed 2x daily for Dx E11.65 01/12/23  Yes Reather Littler, MD  meloxicam (MOBIC) 15 MG tablet 1 tablet daily for 15 days, then daily as needed Orally Once a day for 30 days 08/12/23  Yes [provider]  metFORMIN (GLUCOPHAGE) 1000 MG tablet Take 1,000 mg by mouth 2 (two) times daily with a meal.   Yes [provider]  methocarbamol (ROBAXIN) 500 MG tablet Take 500 mg by mouth as needed for muscle spasms. 12/20/21  Yes [provider]  rosuvastatin (CRESTOR) 5 MG tablet  03/05/15  Yes [provider]  tamsulosin (FLOMAX) 0.4 MG CAPS capsule 1 capsule 11/13/21  Yes [provider]  TRULICITY 0.75 MG/0.5ML SOAJ  08/12/23  Yes [provider]  nitroGLYCERIN (NITROSTAT) 0.4 MG SL tablet Place 1 tablet (0.4 mg total) under the tongue every 5 (five) minutes as needed for chest pain. 04/23/19   Rosalio Macadamia, NP    Current Outpatient Medications  Medication Sig Dispense Refill   aspirin 81 MG tablet Take 81 mg by mouth daily.     Blood Glucose Monitoring Suppl (ONETOUCH VERIO FLEX SYSTEM) w/Device KIT Use to check blood sugar 2 times per day 1 kit 1   Cholecalciferol (VITAMIN D PO) Take by mouth. 1000 units once daily     CINNAMON PO Take 1,000 capsules by mouth  daily.     esomeprazole (NEXIUM) 40 MG capsule Take 1 capsule (40 mg total) by mouth 2 (two) times daily. 60 capsule 1   FARXIGA 10 MG TABS tablet TAKE 1 TABLET BY MOUTH EVERY DAY 30 tablet 3   fexofenadine (ALLEGRA) 180 MG tablet Take 180 mg by mouth daily.     fluticasone (FLONASE) 50 MCG/ACT nasal spray Place 2 sprays into both nostrils as needed for allergies or rhinitis.     glucose blood (ONETOUCH VERIO) test strip Use as instructed 2x daily for Dx E11.65 100 strip 3   meloxicam (MOBIC) 15 MG tablet 1 tablet daily for 15 days, then daily as needed Orally Once a day for 30 days     metFORMIN (GLUCOPHAGE) 1000 MG tablet Take 1,000 mg by mouth 2 (two) times daily with a meal.     methocarbamol (ROBAXIN) 500 MG tablet Take 500 mg by mouth as needed for muscle spasms.     rosuvastatin (CRESTOR) 5 MG tablet   1   tamsulosin (FLOMAX) 0.4 MG CAPS capsule 1 capsule     TRULICITY 0.75 MG/0.5ML SOAJ      nitroGLYCERIN (NITROSTAT) 0.4 MG SL tablet Place 1 tablet (0.4 mg total) under the tongue every 5 (five) minutes as needed for chest pain. 25 tablet 6   Current Facility-Administered Medications  Medication Dose Route Frequency Provider Last Rate Last Admin   0.9 %  sodium chloride infusion  500 mL Intravenous Continuous Navaya Wiatrek, Willaim Rayas, MD        Allergies as of 08/26/2023 - Review Complete 08/26/2023  Allergen Reaction Noted   Augmentin [amoxicillin-pot clavulanate]  01/31/2014   Fluogen [influenza virus vaccine]  05/08/2021    Family History  Problem Relation Age of Onset   Hepatitis Brother    Diabetes Paternal Grandfather    Heart failure Paternal Grandfather        CHF   Colon cancer Neg Hx    Colon polyps Neg Hx    Esophageal cancer Neg Hx    Rectal cancer Neg Hx    Stomach cancer Neg Hx     Social History   Socioeconomic History   Marital status: Married    Spouse name: Not on file   Number of children: Not on file   Years of education: Not on file   Highest  education level: Not on file  Occupational History   Not on file  Tobacco Use   Smoking status: Never   Smokeless tobacco: Never  Vaping Use   Vaping status: Never Used  Substance and Sexual Activity   Alcohol use: No    Alcohol/week: 0.0 standard drinks  of alcohol   Drug use: No   Sexual activity: Not on file  Other Topics Concern   Not on file  Social History Narrative   Not on file   Social Drivers of Health   Financial Resource Strain: Not on file  Food Insecurity: Not on file  Transportation Needs: Not on file  Physical Activity: Not on file  Stress: Not on file  Social Connections: Not on file  Intimate Partner Violence: Not on file    Review of Systems: All other review of systems negative except as mentioned in the HPI.  Physical Exam: Vital signs BP 138/87   Pulse 91   Temp 98.1 F (36.7 C)   Ht 6\' 4"  (1.93 m)   Wt 270 lb (122.5 kg)   SpO2 97%   BMI 32.87 kg/m   General:   Alert,  Well-developed, pleasant and cooperative in NAD Lungs:  Clear throughout to auscultation.   Heart:  Regular rate and rhythm Abdomen:  Soft, nontender and nondistended.   Neuro/Psych:  Alert and cooperative. Normal mood and affect. A and O x 3  Harlin Rain, MD [facility]

## 2023-08-26 NOTE — Op Note (Signed)
[facility] Patient Name: Henry Rivera Procedure Date: 08/26/2023 9:45 AM MRN: [id number] Endoscopist: Viviann Spare P. Adela Lank , MD, [id number] Age: 68 Referring MD:  Date of Birth: 1955-12-12 Gender: Male Account #: [id number] Procedure:                Upper GI endoscopy Indications:              Dysphagia, follow-up of longstanding                            gastro-esophageal reflux disease, change in voice -                            first time EGD Medicines:                Monitored Anesthesia Care Procedure:                Pre-Anesthesia Assessment:                           - Prior to the procedure, a History and Physical                            was performed, and patient medications and                            allergies were reviewed. The patient's tolerance of                            previous anesthesia was also reviewed. The risks                            and benefits of the procedure and the sedation                            options and risks were discussed with the patient.                            All questions were answered, and informed consent                            was obtained. Prior Anticoagulants: The patient has                            taken no anticoagulant or antiplatelet agents. ASA                            Grade Assessment: III - A patient with severe                            systemic disease. After reviewing the risks and                            benefits, the patient was deemed in satisfactory  condition to undergo the procedure.                           After obtaining informed consent, the endoscope was                            passed under direct vision. Throughout the                            procedure, the patient's blood pressure, pulse, and                            oxygen saturations were monitored continuously. The                            GIF [id number] #[id number] was introduced  through the                            mouth, and advanced to the second part of duodenum.                            The upper GI endoscopy was accomplished without                            difficulty. The patient tolerated the procedure                            well. Scope In: Scope Out: Findings:                 Esophagogastric landmarks were identified: the                            Z-line was found at 44 cm, the gastroesophageal                            junction was found at 44 cm and the upper extent of                            the gastric folds was found at 45 cm from the                            incisors.                           A 1 cm hiatal hernia was present.                           The exam of the esophagus was otherwise normal. No                            Barrett's. No erosive changes. No stenosis /                            stricture noted. Empiric  dilation not performed                            given food remnants in the stomach.                           A medium amount of food (residue) was found in the                            gastric body which prohibited views of the stomach.                           The exam of the stomach was otherwise normal.                           The examined duodenum was normal. Complications:            No immediate complications. Estimated blood loss:                            Minimal. Estimated Blood Loss:     Estimated blood loss was minimal. Impression:               - Esophagogastric landmarks identified.                           - 1 cm hiatal hernia.                           - Normal esophagus otherwise - no stenosis /                            stricture, no Barrett's - I was going to perform                            empiric dilation of his esophagus but did not do so                            given aspiration risk for residual food in the                            stomach.                           - A  medium amount of food (residue) in the stomach.                           - Normal stomach otherwise.                           - Normal examined duodenum. Recommendation:           - Patient has a contact number available for                            emergencies. The signs and symptoms of potential  delayed complications were discussed with the                            patient. Return to normal activities tomorrow.                            Written discharge instructions were provided to the                            patient.                           - Resume previous diet.                           - Continue present medications.                           - Refer to ENT to further evaluate                           - Consider repeat EGD with empiric dilation if                            symptoms persist Tu Bayle P. Thos Matsumoto, MD 08/26/2023 10:12:33 AM This report has been signed electronically.

## 2023-08-26 NOTE — Patient Instructions (Signed)

## 2023-08-29 ENCOUNTER — Telehealth: Payer: Self-pay

## 2023-08-29 NOTE — Telephone Encounter (Signed)
  Follow up Call-     08/26/2023    9:09 AM  Call back number  Post procedure Call Back phone  # [phone]  Permission to leave phone message Yes     Patient questions:  Do you have a fever, pain , or abdominal swelling? No. Pain Score  0 *  Have you tolerated food without any problems? Yes.    Have you been able to return to your normal activities? Yes.    Do you have any questions about your discharge instructions: Diet   No. Medications  No. Follow up visit  No.  Do you have questions or concerns about your Care? No.  Actions: * If pain score is 4 or above: No action needed, pain <4.

## 2023-08-31 ENCOUNTER — Other Ambulatory Visit: Payer: Self-pay

## 2023-08-31 ENCOUNTER — Telehealth: Payer: Self-pay

## 2023-08-31 DIAGNOSIS — R499 Unspecified voice and resonance disorder: Secondary | ICD-10-CM

## 2023-08-31 DIAGNOSIS — R1310 Dysphagia, unspecified: Secondary | ICD-10-CM

## 2023-08-31 NOTE — Telephone Encounter (Signed)
Per EGD report from 1-24, referred patient to [facility] for dysphagia and hoarseness

## 2023-09-01 ENCOUNTER — Ambulatory Visit (HOSPITAL_BASED_OUTPATIENT_CLINIC_OR_DEPARTMENT_OTHER): Payer: Medicare Other | Admitting: Cardiovascular Disease

## 2023-09-09 DIAGNOSIS — R490 Dysphonia: Secondary | ICD-10-CM | POA: Diagnosis not present

## 2023-09-09 DIAGNOSIS — D170 Benign lipomatous neoplasm of skin and subcutaneous tissue of head, face and neck: Secondary | ICD-10-CM | POA: Diagnosis not present

## 2023-09-09 DIAGNOSIS — K219 Gastro-esophageal reflux disease without esophagitis: Secondary | ICD-10-CM | POA: Diagnosis not present

## 2023-09-09 DIAGNOSIS — K449 Diaphragmatic hernia without obstruction or gangrene: Secondary | ICD-10-CM | POA: Diagnosis not present

## 2023-09-20 DIAGNOSIS — M25511 Pain in right shoulder: Secondary | ICD-10-CM | POA: Diagnosis not present

## 2023-09-20 DIAGNOSIS — M542 Cervicalgia: Secondary | ICD-10-CM | POA: Diagnosis not present

## 2023-10-06 ENCOUNTER — Encounter (HOSPITAL_BASED_OUTPATIENT_CLINIC_OR_DEPARTMENT_OTHER): Payer: Self-pay | Admitting: Cardiovascular Disease

## 2023-10-06 ENCOUNTER — Ambulatory Visit (HOSPITAL_BASED_OUTPATIENT_CLINIC_OR_DEPARTMENT_OTHER): Admitting: Cardiovascular Disease

## 2023-10-06 VITALS — BP 112/74 | HR 77 | Ht 76.5 in | Wt 267.2 lb

## 2023-10-06 DIAGNOSIS — I10 Essential (primary) hypertension: Secondary | ICD-10-CM | POA: Diagnosis not present

## 2023-10-06 DIAGNOSIS — R29898 Other symptoms and signs involving the musculoskeletal system: Secondary | ICD-10-CM

## 2023-10-06 DIAGNOSIS — I2510 Atherosclerotic heart disease of native coronary artery without angina pectoris: Secondary | ICD-10-CM | POA: Diagnosis not present

## 2023-10-06 DIAGNOSIS — I739 Peripheral vascular disease, unspecified: Secondary | ICD-10-CM

## 2023-10-06 DIAGNOSIS — R0609 Other forms of dyspnea: Secondary | ICD-10-CM | POA: Diagnosis not present

## 2023-10-06 HISTORY — DX: Other symptoms and signs involving the musculoskeletal system: R29.898

## 2023-10-06 HISTORY — DX: Other forms of dyspnea: R06.09

## 2023-10-06 NOTE — Patient Instructions (Addendum)
 Medication Instructions:  Your physician recommends that you continue on your current medications as directed. Please refer to the Current Medication list given to you today.   *If you need a refill on your cardiac medications before your next appointment, please call your pharmacy*  Lab Work: NONE  Testing/Procedures: Your physician has requested that you have en exercise stress myoview. For further information please visit [email or web address] Please follow instruction sheet, as given.  Your physician has requested that you have an ankle brachial index (ABI). During this test an ultrasound and blood pressure cuff are used to evaluate the arteries that supply the arms and legs with blood. Allow thirty minutes for this exam. There are no restrictions or special instructions.  Please note: We ask at that you not bring children with you during ultrasound (echo/ vascular) testing. Due to room size and safety concerns, children are not allowed in the ultrasound rooms during exams. Our front office staff cannot provide observation of children in our lobby area while testing is being conducted. An adult accompanying a patient to their appointment will only be allowed in the ultrasound room at the discretion of the ultrasound technician under special circumstances. We apologize for any inconvenience.  Your physician has requested that you have a lower extremity arterial duplex. This test is an ultrasound of the arteries in the legs or arms. It looks at arterial blood flow in the legs and arms. Allow one hour for Lower and Upper Arterial scans. There are no restrictions or special instructions.  Please note: We ask at that you not bring children with you during ultrasound (echo/ vascular) testing. Due to room size and safety concerns, children are not allowed in the ultrasound rooms during exams. Our front office staff cannot provide observation of children in our lobby area while testing is being  conducted. An adult accompanying a patient to their appointment will only be allowed in the ultrasound room at the discretion of the ultrasound technician under special circumstances. We apologize for any inconvenience.  Follow-Up: At [facility], you and your health needs are our priority.  As part of our continuing mission to provide you with exceptional heart care, we have created designated Provider Care Teams.  These Care Teams include your primary Cardiologist (physician) and Advanced Practice Providers (APPs -  Physician Assistants and Nurse Practitioners) who all work together to provide you with the care you need, when you need it.  We recommend signing up for the patient portal called "MyChart".  Sign up information is provided on this After Visit Summary.  MyChart is used to connect with patients for Virtual Visits (Telemedicine).  Patients are able to view lab/test results, encounter notes, upcoming appointments, etc.  Non-urgent messages can be sent to your provider as well.   To learn more about what you can do with MyChart, go to [email or web address].    Your next appointment:   12 month(s)  Provider:   Chilton Si, MD, Eligha Bridegroom, NP, or Gillian Shields, NP    Other Instructions Your Physician has requested you have a Myocardial Perfusion Imaging Study.    Please arrive 15 minutes prior to your appointment time for registration and insurance purposes.   The test will take approximately 3 to 4 hours to complete; you may bring reading material. If someone comes with you to your appointment, they will need to remain in the main lobby due to limited testing space in the testing area. **If you are pregnant or breastfeeding, please notify the  nuclear lab prior to your appointment**   How to prepare for your test:  - Do not eat or drink 3 hours prior to your test, except you may have water  - Do not consume products containing caffeine ( regular or decaf) 12  hours prior to your test ( coffee, chocolate, sodas or teas)  - Do bring a list of your current medications with you. If not listed below, you may take your medications as normal (Hold beta blocker- 24 hour prior for exercise myoview)   - Do wear comfortable clothes (no dresses or overalls) and walking shoes ( tennis shoes preferred), no heel or open toe shoes are allowed - Do not wear cologne, perfume, aftershave, or lotions ( deodorant is allowed)  - If these instructions are not followed, your test will be rescheduled   If you cannot keep your appointment, please provide 24 hours notice to the Nuclear Lab, to avoid a possible $50 charge to your account!

## 2023-10-06 NOTE — Progress Notes (Signed)
 Cardiology Office Note:  .   Date:  10/06/2023  ID:  Henry Rivera, DOB 08/07/1955, MRN [id number] PCP: Merri Brunette, MD  [facility] Providers Cardiologist:  Lesleigh Noe, MD (Inactive)    History of Present Illness: .    Henry Rivera is a 68 y.o. male,, and hypertension here for follow-up.  Previously a patient of Dr. Katrinka Blazing.  He underwent coronary stenting in 2012 and has done well since that time.  He last saw Henry Gulling, NP 06/2022 and had no complaints.  Henry Rivera has been experiencing fatigue and shortness of breath on exertion over the past few weeks. His legs feel tired, especially when climbing stairs, and he becomes slightly winded after walking two miles. No chest pain or cramping in his legs. He has not been as physically active this year due to various health issues.  He has a history of coronary artery disease with a stent placed in the LAD in 2012. He is on rosuvastatin 5 mg every other day, adjusted from 20 mg daily due to low cholesterol levels. His cholesterol levels are well-controlled, with a recent LDL at 33. He also takes aspirin and [place].  He describes a recent episode of discomfort in the upper abdomen that felt different from his usual reflux symptoms. This occurred upon waking and improved after eating. No associated nausea or chest pain.  He mentions experiencing laryngeal reflux for the past two weeks, managed with over-the-counter medication. He has an upcoming appointment with an ENT specialist for further evaluation. He has been using Flonase as needed for a long time.  His family history includes heart disease in his father, who died at 58 from a pulmonary embolism, and diabetes in his mother.  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 He used to walk regularly for exercise but has not been as active recently.     ROS:  As per HPI  Studies Reviewed: .        Risk Assessment/Calculations:              Physical Exam:   VS:  BP 112/74   Pulse 77   Ht 6' 4.5" (1.943 m)   Wt 267 lb 3.2 oz (121.2 kg)   SpO2 97%   BMI 32.10 kg/m  , BMI Body mass index is 32.1 kg/m. GENERAL:  Well appearing HEENT: Pupils equal round and reactive, fundi not visualized, oral mucosa unremarkable NECK:  No jugular venous distention, waveform within normal limits, carotid upstroke brisk and symmetric, no bruits, no thyromegaly LUNGS:  Clear to auscultation bilaterally HEART:  RRR.  PMI not displaced or sustained,S1 and S2 within normal limits, no S3, no S4, no clicks, no rubs, no murmurs ABD:  Flat, positive bowel sounds normal in frequency in pitch, no bruits, no rebound, no guarding, no midline pulsatile mass, no hepatomegaly, no splenomegaly EXT:  1+ DP/TP,  no edema, no cyanosis no clubbing SKIN:  No rashes no nodules NEURO:  Cranial nerves II through XII grossly intact, motor grossly intact throughout PSYCH:  Cognitively intact, oriented to person place and time   ASSESSMENT AND PLAN: .    # Laryngopharyngeal Reflux Currently managed with over-the-counter medication. Follow-up appointment with ENT in a few weeks. -Continue current over-the-counter medication.  # Fatigue and Shortness of Breath # CAD s/p PCI:  Reports feeling winded and leg tiredness after walking and climbing stairs. No chest pain or cramping. -Order arterial dopplers  and ABI to assess for potential peripheral artery disease. -Schedule a exercise Myoview stress test to assess cardiac function and perfusion.  # Hyperlipidemia Well controlled on rosuvastatin 5mg  every other day. -Continue current medication regimen. -Plan for cholesterol check at six-month follow-up appointment in April.  General Health Maintenance / Followup Plans -Continue current medications including aspirin and Farxiga. -Return for follow-up in one year, or sooner if any test results are abnormal.      Informed Consent   Shared Decision Making/Informed  Consent The risks [chest pain, shortness of breath, cardiac arrhythmias, dizziness, blood pressure fluctuations, myocardial infarction, stroke/transient ischemic attack, nausea, vomiting, allergic reaction, radiation exposure, metallic taste sensation and life-threatening complications (estimated to be 1 in 10,000)], benefits (risk stratification, diagnosing coronary artery disease, treatment guidance) and alternatives of a nuclear stress test were discussed in detail with Henry Rivera and he agrees to proceed.      Signed, Chilton Si, MD

## 2023-10-07 ENCOUNTER — Encounter (HOSPITAL_COMMUNITY): Payer: Self-pay

## 2023-10-08 ENCOUNTER — Other Ambulatory Visit: Payer: Self-pay | Admitting: Gastroenterology

## 2023-10-10 ENCOUNTER — Ambulatory Visit (HOSPITAL_COMMUNITY): Attending: Cardiovascular Disease

## 2023-10-10 DIAGNOSIS — I10 Essential (primary) hypertension: Secondary | ICD-10-CM | POA: Diagnosis not present

## 2023-10-10 DIAGNOSIS — R0609 Other forms of dyspnea: Secondary | ICD-10-CM | POA: Diagnosis not present

## 2023-10-10 DIAGNOSIS — I2510 Atherosclerotic heart disease of native coronary artery without angina pectoris: Secondary | ICD-10-CM | POA: Diagnosis not present

## 2023-10-10 LAB — MYOCARDIAL PERFUSION IMAGING
Angina Index: 0
Duke Treadmill Score: 7
Estimated workload: 8
Exercise duration (min): 6 min
Exercise duration (sec): 40 s
LV dias vol: 101 mL (ref 62–150)
LV sys vol: 46 mL
MPHR: 153 {beats}/min
Nuc Stress EF: 54 %
Peak HR: 157 {beats}/min
Percent HR: 102 %
Rest HR: 81 {beats}/min
Rest Nuclear Isotope Dose: 10.2 mCi
SDS: 1
SRS: 0
SSS: 1
ST Depression (mm): 0 mm
Stress Nuclear Isotope Dose: 32.5 mCi
TID: 0.95

## 2023-10-10 MED ORDER — TECHNETIUM TC 99M TETROFOSMIN IV KIT
32.5000 | PACK | Freq: Once | INTRAVENOUS | Status: AC | PRN
  Administered 2023-10-10: 32.5 via INTRAVENOUS

## 2023-10-10 MED ORDER — TECHNETIUM TC 99M TETROFOSMIN IV KIT
10.2000 | PACK | Freq: Once | INTRAVENOUS | Status: AC | PRN
  Administered 2023-10-10: 10.2 via INTRAVENOUS

## 2023-10-18 ENCOUNTER — Other Ambulatory Visit (HOSPITAL_BASED_OUTPATIENT_CLINIC_OR_DEPARTMENT_OTHER): Payer: Self-pay | Admitting: Orthopedic Surgery

## 2023-10-18 DIAGNOSIS — M25511 Pain in right shoulder: Secondary | ICD-10-CM | POA: Diagnosis not present

## 2023-10-18 DIAGNOSIS — M542 Cervicalgia: Secondary | ICD-10-CM | POA: Diagnosis not present

## 2023-10-18 DIAGNOSIS — Z01818 Encounter for other preprocedural examination: Secondary | ICD-10-CM | POA: Diagnosis not present

## 2023-10-19 ENCOUNTER — Ambulatory Visit (INDEPENDENT_AMBULATORY_CARE_PROVIDER_SITE_OTHER): Payer: Medicare Other | Admitting: Otolaryngology

## 2023-10-19 ENCOUNTER — Encounter (INDEPENDENT_AMBULATORY_CARE_PROVIDER_SITE_OTHER): Payer: Self-pay | Admitting: Otolaryngology

## 2023-10-19 VITALS — BP 132/81 | HR 83

## 2023-10-19 DIAGNOSIS — J383 Other diseases of vocal cords: Secondary | ICD-10-CM | POA: Diagnosis not present

## 2023-10-19 DIAGNOSIS — R0982 Postnasal drip: Secondary | ICD-10-CM

## 2023-10-19 DIAGNOSIS — K219 Gastro-esophageal reflux disease without esophagitis: Secondary | ICD-10-CM | POA: Diagnosis not present

## 2023-10-19 DIAGNOSIS — R490 Dysphonia: Secondary | ICD-10-CM

## 2023-10-19 DIAGNOSIS — J3089 Other allergic rhinitis: Secondary | ICD-10-CM

## 2023-10-19 DIAGNOSIS — J370 Chronic laryngitis: Secondary | ICD-10-CM | POA: Diagnosis not present

## 2023-10-19 DIAGNOSIS — R0981 Nasal congestion: Secondary | ICD-10-CM | POA: Diagnosis not present

## 2023-10-19 MED ORDER — METHYLPREDNISOLONE 4 MG PO TBPK: ORAL_TABLET | ORAL | 1 refills | Status: DC

## 2023-10-19 MED ORDER — FLUTICASONE PROPIONATE 50 MCG/ACT NA SUSP: 2.0000 | Freq: Two times a day (BID) | NASAL | 6 refills | Status: AC

## 2023-10-19 MED ORDER — FLUCONAZOLE 100 MG PO TABS: 100.0000 mg | ORAL_TABLET | Freq: Every day | ORAL | 0 refills | Status: AC

## 2023-10-19 MED ORDER — SULFAMETHOXAZOLE-TRIMETHOPRIM 800-160 MG PO TABS: 1.0000 | ORAL_TABLET | Freq: Two times a day (BID) | ORAL | 0 refills | Status: DC

## 2023-10-19 NOTE — Progress Notes (Signed)
 ENT CONSULT:  Reason for Consult: chronic dysphonia  choking episodes   HPI: Discussed the use of AI scribe software for clinical note transcription with the patient, who gave verbal consent to proceed.  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   He experiences choking episodes two to three times a week, which can occur while swallowing liquids or spontaneously, such as when watching TV from swallowing his own saliva. These episodes involve coughing until the sensation eases. His wife is concerned about the potential impact on his heart, as he has a stent in place and has hx of CAD.  Over the past two to three months, he has experienced voice changes, with his voice becoming weaker and more breathy, described as almost a whisper. He has not had any recent illnesses or surgeries that could explain this change. 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  He has been diagnosed with reflux laryngitis and was informed about mucus drainage around his vocal cords after seeing Dr Ernestene Kiel ENT. He has attempted dietary changes, reducing his intake of caffeine-free diet Mountain Dew from six to one or two per day, and cutting back on pizza. However, he finds it challenging to adhere to dietary restrictions recommended.  He has a history of allergies to dust mites and red oaks, which previously led to frequent bronchitis episodes, occurring three to six times a year. Allergy testing and lung x-rays were conducted, and he was advised to avoid mowing grass, which he enjoys.  He experiences shortness of breath and leg pain/fatigue after climbing stairs, which resolves after 30 seconds. He has a history of a stent placement in the LAD due to a 90% blockage in April 2012. He is currently awaiting results from a recent nuclear stress test and plans to  have a Doppler study on his legs.  He takes baby aspirin and Nexium, the latter of which was increased to twice daily before an endoscopy a few months ago, then reduced back to once daily. The endoscopy revealed a small hiatal hernia, which has been stable for 30 years.      Records Reviewed:  Office visit with Dr Ernestene Kiel ENT 09/09/23 He has been experiencing intermittent episodes of choking, reminiscent of food entering the windpipe, despite being on saliva. These episodes occur approximately once every few weeks. A few months prior, he began to notice an impact on his voice, which is significant given his roles as a Education officer, environmental and bass singer. He reports that his ability to speak loudly fluctuates. He also reports persistent drainage. He has no history of smoking or alcohol consumption. His diet includes pizza once or twice a week, occasional orange juice, and a daily intake of caffeine-free Diet [facility]. He typically consumes 3 meals a day and does not snack after 10:00 PM. An upper GI endoscopy performed 2 weeks ago by Dr. Adela Lank revealed "no abnormalities". However, the presence of food in his stomach was noted, despite fasting since 10:00 PM the previous night. Consequently, esophageal dilation was not performed. He was referred to an ENT specialist and advised to undergo a laryngoscopy. He has been on Nexium for several years for GERD.  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 It is not getting bigger and is not  causing pain   MEDICAL DECISION MAKING IMPRESSION: 1. LPRD (laryngopharyngeal reflux disease)  2. Dysphonia  3. Gastroesophageal reflux disease without esophagitis  4. Lipoma of neck  5. Hiatal hernia   RECOMMENDATIONS:  Discussed that I do not appreciate any vocal fold immobility, vocal fold lesions, or vocal fold atrophy/presbylarynges. There was copious pharyngeal and/or laryngeal mucous present on exam suggestive of  irritation from possible acid reflux/LPR. I have recommended changing his AM Nexium to night time (30 minutes prior to dinner dosing to improve nocturnal acid control) as well as dietary and lifestyle modifications to reduce acid production. GERD/LPR Handout provided and reviewed in detail with the patient. Specifically the patient was instructed to cut back on pizza, diet [facility] and chocolate as well as eating after 6 PM. Discussed that LPR patients often do not have classic signs of GERD (heartburn), but instead may have hoarseness, throat clearing, dysphagia, increased phlegm, and globus sensation.  If no improvement in several months we will consider referral to laryngologist given professional voice complaints i.e. 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     Past Medical History:  Diagnosis Date   Allergic rhinitis    Allergy    Basal cell carcinoma 03/19/2015   L BACK, SUPERFICIAL, CX3, CAUTERY, 5FU   Basal cell carcinoma (BCC) 07/14/2021   R Upper Arm - CX3, 5FU   Basal cell carcinoma (BCC) 07/14/2021   L Forearm - CX3, 5FU   CAD (coronary artery disease) 11/2010   WITH LAD DES    Cataract    forming- small    Diabetes (HCC) 2007   Exertional dyspnea 10/06/2023   GERD (gastroesophageal reflux disease)    Hyperlipidemia    pt states uses Crestor due to Stent - not HLD    Hypertension    Leg heaviness 10/06/2023   MI (mitral incompetence)    SCCA (squamous cell carcinoma) of skin 07/14/2021   Right Hand - posterior (in situ)   Squamous cell carcinoma of skin 10/27/2009   SCC A/KA, L ELBOW, EXC   Vitamin D deficiency     Past Surgical History:  Procedure Laterality Date   COLONOSCOPY  03/27/2004   2017- #2   CORONARY ANGIOPLASTY WITH STENT PLACEMENT  11/2010   HAMMER TOE SURGERY Right    POLYPECTOMY     VASECTOMY      Family History  Problem Relation Age of Onset   Diabetes Mother    Pulmonary  embolism Father    Diabetes Sister    Hepatitis Brother    Diabetes Paternal Grandfather    Heart failure Paternal Grandfather        CHF   Colon cancer Neg Hx    Colon polyps Neg Hx    Esophageal cancer Neg Hx    Rectal cancer Neg Hx    Stomach cancer Neg Hx     Social History:  reports that he has never smoked. He has never used smokeless tobacco. He reports that he does not drink alcohol and does not use drugs.  Allergies:  Allergies  Allergen Reactions   Augmentin [Amoxicillin-Pot Clavulanate]     Gi sx   Fluogen [Influenza Virus Vaccine]     Other reaction(s): Unknown   Haemophilus B Polysaccharide Vaccine Other (See Comments)    Other reaction(s): Unknown  Haemophilus influenzae type b    Medications: I have reviewed the patient's current medications.  The PMH, PSH, Medications, Allergies, and SH  were reviewed and updated.  ROS: Constitutional: Negative for fever, weight loss and weight gain. Cardiovascular: Negative for chest pain and dyspnea on exertion. Respiratory: Is not experiencing shortness of breath at rest. Gastrointestinal: Negative for nausea and vomiting. Neurological: Negative for headaches. Psychiatric: The patient is not nervous/anxious  Blood pressure 132/81, pulse 83, SpO2 99%. There is no height or weight on file to calculate BMI.  PHYSICAL EXAM:  Exam: General: Well-developed, well-nourished Communication and Voice: Clear pitch and clarity Respiratory Respiratory effort: Equal inspiration and expiration without stridor Cardiovascular Peripheral Vascular: Warm extremities with equal color/perfusion Eyes: No nystagmus with equal extraocular motion bilaterally Neuro/Psych/Balance: Patient oriented to person, place, and time; Appropriate mood and affect; Gait is intact with no imbalance; Cranial nerves I-XII are intact Head and Face Inspection: Normocephalic and atraumatic without mass or lesion Palpation: Facial skeleton intact without  bony stepoffs Salivary Glands: No mass or tenderness Facial Strength: Facial motility symmetric and full bilaterally ENT Pinna: External ear intact and fully developed External canal: Canal is patent with intact skin Tympanic Membrane: Clear and mobile External Nose: No scar or anatomic deformity Internal Nose: Septum is deviated to the left. No polyp, or purulence. Mucosal edema and erythema present.  Bilateral inferior turbinate hypertrophy.  Lips, Teeth, and gums: Mucosa and teeth intact and viable TMJ: No pain to palpation with full mobility Oral cavity/oropharynx: No erythema or exudate, no lesions present Nasopharynx: No mass or lesion with intact mucosa Hypopharynx: Intact mucosa without pooling of secretions Larynx Glottic: Full true vocal cord mobility without lesion or mass VF atrophy glottic insufficiency. Scant mucus along hypopharynx and larynx  Supraglottic: Normal appearing epiglottis and AE folds Interarytenoid Space: Moderate pachydermia&edema Subglottic Space: Patent without lesion or edema Neck Neck and Trachea: Midline trachea without mass or lesion Thyroid: No mass or nodularity Lymphatics: No lymphadenopathy  Procedure:  Preoperative diagnosis: hoarseness and choking episodes  Postoperative diagnosis:   same  Procedure: Flexible fiberoptic laryngoscopy with stroboscopy (56213)   Surgeon: Ashok Croon, MD  Anesthesia: Topical lidocaine and Afrin  Complications: None  Condition is stable throughout exam  Indications and consent:   The patient presents to the clinic with hoarseness. All the risks, benefits, and potential complications were reviewed with the patient preoperatively and informed verbal consent was obtained.  Procedure: The patient was seated upright in the exam chair.   Topical lidocaine and Afrin were applied to the nasal cavity. After adequate anesthesia had occurred, the flexible telescope with strobe capabilities was passed into the  nasal cavity. The nasopharynx was patent without mass or lesion. The scope was passed behind the soft palate and directed toward the base of tongue. The base of tongue was visualized and was symmetric with no apparent masses or abnormal appearing tissue. There were no signs of a mass or pooling of secretions in the piriform sinuses. The supraglottic structures were normal.  The true vocal cords are mobile. The medial edges were bowed and there was evidence of VF atrophy b/l.Closure was incomplete and there was evidence of supraglottic compression with high-pitch phonation. Periodicity present. There was scant mucus throughout hypopharynx. The mucosal wave and amplitude were intact and symmetric/normal in implitude. There is moderate interarytenoid pachydermia and post cricoid edema. The mucosa appears without lesions.   The laryngoscope was then slowly withdrawn and the patient tolerated the procedure well. There were no complications or blood loss.  Studies Reviewed: EGD IMPRESSION August 28, 2023 Dr. Fritzi Mandes  Impression: - Esophagogastric landmarks identified. - 1 cm hiatal hernia. -  Normal esophagus otherwise - no stenosis /  stricture, no Barrett's - I was going to perform  empiric dilation of his esophagus but did not do so  given aspiration risk for residual food in the  stomach. - A medium amount of food (residue) in the stomach. - Normal stomach otherwise. - Normal examined duodenum.   CXR 06/02/2022 FINDINGS: The heart size and mediastinal contours are within normal limits. Both lungs are clear. The visualized skeletal structures are unremarkable.   IMPRESSION: No active disease.   Assessment/Plan: Encounter Diagnoses  Name Primary?   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  Videostrobe today: The true vocal cords are mobile. The medial edges were bowed and there was evidence of VF atrophy b/l.Closure was incomplete and there was evidence of supraglottic compression with high-pitch phonation. Periodicity present. There was scant mucus throughout hypopharynx. The mucosal wave and amplitude were intact and symmetric/normal in implitude. There is moderate interarytenoid pachydermia and post cricoid edema.  We discussed that his voice changes are likely multifactorial but one of the contributing factors is vocal fold atrophy noted on exam today. Likely age-related, as it commonly begins in the fifties and sixties. Reports a change in voice over the past two to three months, with frequent voice use as a Education officer, environmental and singer. No evidence of phonotrauma, such as bruising or thickening of the vocal cords. Discussed treatment options including voice therapy, filler injection, or medialization thyroplasty surgery. Voice therapy is the initial approach to optimize breath support and improve voice quality. Further interventions considered if voice therapy alone is insufficient.  Also had evidence of thick mucus in hypopharynx and this could be a sign of significant postnasal drainage versus chronic laryngitis.  There were also findings of GERD LPR.  Will medically address both. - Refer to speech therapy for voice therapy to optimize breath support and improve voice quality. - Continue regular voice use as there is no evidence of phonotrauma. - Consider other interventions if no improvement with speech therapy -Medical management of chronic laryngitis as below -Medical management of postnasal drainage and GERD LPR as below  Chronic laryngitis Exam with scant thick mucus in hypopharynx and along  laryngeal structures etiology unclear, but may be related to postnasal drainage or reflux irritation, versus laryngitis sicca syndrome, versus chronic laryngitis. Has reflux and environmental allergies to dust mites and red oaks. Treatment includes a combination of antibiotic, steroid, and antifungal for chronic laryngitis. Discussed the potential for short-term blood sugar elevation due to steroid use, especially given his diabetes. - Prescribe Bactrim, Diflucan, and Medrol Pak to address potential bacterial, fungal, or inflammatory causes of laryngitis. - nasal saline rinses to reduce nasal mucus and postnasal drip. - Continue Allegra daily and Flonase twice daily for allergy management.  Chronic nasal congestion and post-nasal drainage Evidence of post-nasal drainage/nasal crusting during scope exam today, could be contributing to her sx -Continue Allegra daily and start Flonase 2 puffs b/l nares BID - consider nasal saline rinses   GERD LPR Reflux irritation may contribute to laryngospasm (choking episodes could be  related to laryngospasm ). Made some dietary changes but continues to experience symptoms. Currently taking Nexium. Discussed the use of a Reflux Gourmet to be taken after meals to prevent reflux. Provided dietary guidance and resources for managing reflux, including a website with reflux-friendly recipes. - Continue Nexium as prescribed. -  Reflux Gourmet after meals - diet and lifestyle changes to minimize GERD - Refer to [employer] blog for dietary and lifestyle modifications/reflux cook book.  Laryngospasm Experiences choking episodes two to three times a week, which may be related to laryngospasm. Could be due to reflux or vocal cord atrophy, as incomplete vocal cord closure can trigger a cough reflex when mucus or other irritants contact the vocal cords. Addressing underlying reflux and mucus issues is essential. - Address underlying reflux and mucus issues as  above  Follow-up Requires follow-up to assess the effectiveness of the treatment plan and determine if further interventions are necessary. - Schedule follow-up appointment in a couple of months after speech therapy   Thank you for allowing me to participate in the care of this patient. Please do not hesitate to contact me with any questions or concerns.   Ashok Croon, MD Otolaryngology [facility] Phone: [phone] Fax: [phone]    10/19/2023, 2:44 PM

## 2023-10-19 NOTE — Patient Instructions (Addendum)
-   nasal saline rinses  - Flonase twice daily and continue Allegra - continue Nexium and start Reflux Gourmet  - schedule speech therapy - take Bactrim/Diflucan and Medrol pack for laryngitis  - return in 3 months   [email or web address] - check out this website to learn more about reflux   -Avoid lying down for at least two hours after a meal or after drinking acidic beverages, like soda, or other caffeinated beverages. This can help to prevent stomach contents from flowing back into the esophagus. -Keep your head elevated while you sleep. Using an extra pillow or two can also help to prevent reflux. -Eat smaller and more frequent meals each day instead of a few large meals. This promotes digestion and can aid in preventing heartburn. -Wear loose-fitting clothes to ease pressure on the stomach, which can worsen heartburn and reflux. -Reduce excess weight around the midsection. This can ease pressure on the stomach. Such pressure can force some stomach contents back up the esophagus - Take Reflux Gourmet (natural supplement available on [employer]) to help with symptoms of chronic throat irritation     Lloyd Huger Med Nasal Saline Rinse   - start nasal saline rinses with [employer] available over the counter or online to help with nasal congestion

## 2023-10-20 NOTE — Therapy (Signed)
 OUTPATIENT SPEECH LANGUAGE PATHOLOGY VOICE EVALUATION   Patient Name: Henry Rivera MRN: [id number] DOB:August 09, 1955, 68 y.o., male Today's Date: 10/25/2023  PCP: Merri Brunette, MD REFERRING PROVIDER: Ashok Croon, MD  END OF SESSION:  End of Session - 10/25/23 0943     Visit Number 1    Number of Visits 9    Date for SLP Re-Evaluation 01/03/24   to accomodate scheduling   Authorization Type [employer] Medicare    Progress Note Due on Visit 10    SLP Start Time 0933    SLP Stop Time  1015    SLP Time Calculation (min) 42 min    Activity Tolerance Patient tolerated treatment well             Past Medical History:  Diagnosis Date   Allergic rhinitis    Allergy    Basal cell carcinoma 03/19/2015   L BACK, SUPERFICIAL, CX3, CAUTERY, 5FU   Basal cell carcinoma (BCC) 07/14/2021   R Upper Arm - CX3, 5FU   Basal cell carcinoma (BCC) 07/14/2021   L Forearm - CX3, 5FU   CAD (coronary artery disease) 11/2010   WITH LAD DES    Cataract    forming- small    Diabetes (HCC) 2007   Exertional dyspnea 10/06/2023   GERD (gastroesophageal reflux disease)    Hyperlipidemia    pt states uses Crestor due to Stent - not HLD    Hypertension    Leg heaviness 10/06/2023   MI (mitral incompetence)    SCCA (squamous cell carcinoma) of skin 07/14/2021   Right Hand - posterior (in situ)   Squamous cell carcinoma of skin 10/27/2009   SCC A/KA, L ELBOW, EXC   Vitamin D deficiency    Past Surgical History:  Procedure Laterality Date   COLONOSCOPY  03/27/2004   2017- #2   CORONARY ANGIOPLASTY WITH STENT PLACEMENT  11/2010   HAMMER TOE SURGERY Right    POLYPECTOMY     VASECTOMY     Patient Active Problem List   Diagnosis Date Noted   Exertional dyspnea 10/06/2023   Leg heaviness 10/06/2023   Diabetic peripheral neuropathy associated with type 2 diabetes mellitus (HCC) 01/14/2020   Coronary artery disease involving native coronary artery of native heart without angina pectoris  02/21/2014   Hyperlipidemia 02/21/2014   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 No surgical history to support voice changes. Dx with reflux, taking Nexium.   PAIN:  Are you having pain? No  FALLS: Has patient fallen in last 6 months? No, Number of falls: none  LIVING ENVIRONMENT: Lives with: lives with their spouse Lives in: House/apartment  PLOF:Level of assistance: Independent with ADLs Employment: Full-time employment Full Time   PATIENT GOALS: improved vocal quality, return to singing  OBJECTIVE:  Note: Objective measures were completed at Evaluation unless otherwise noted.  DIAGNOSTIC FINDINGS: 10/20/23 Flexible fiberoptic laryngoscopy with stroboscopy  The nasopharynx was patent without mass or lesion. The scope was passed  behind the soft palate and directed toward the base of tongue. The base of tongue was visualized and was symmetric with no apparent masses or abnormal appearing tissue. There were no signs of a mass or pooling of secretions in the piriform sinuses. The supraglottic structures were normal.   The true vocal cords are mobile. The medial edges were bowed and there was evidence of VF atrophy b/l.Closure was incomplete and there was evidence of supraglottic compression with high-pitch phonation. Periodicity present. There was scant mucus throughout hypopharynx. The mucosal wave and  amplitude were intact and symmetric/normal in implitude. There is moderate interarytenoid pachydermia and post cricoid edema. The mucosa appears without lesions.  COGNITION: Overall cognitive status: Within functional limits for tasks assessed  SOCIAL HISTORY: Occupation: Occupational hygienist use: singing in [employer] intake: optimal Caffeine/alcohol intake: moderate Daily voice use: pt reports minimal daily usage "I don't use my voice much during the day, but I use it when I'm around people." During interview, appears pt is a usual voice user for vocation, speaking 2-3 times per week, engaged in other church activities, speaking to family members, etc   PHYSICAL SYMPTOMS: Do you have any burning, soreness, tickling, or irritation in your throat? No Do you sometimes have a sensation of a lump in your throat?  No Do you have any aching or tightness in your throat? No Do you ever feel tension in your neck area? No Does your voice get tired easily? No Do you feel as if you have to strain to produce voice? Yes Do you feel as if you need to cough or clear your throat a lot? Yes Do you ever lose your voice completely? Yes  Do you ever have difficulty swallowing? No Do you have difficulty projecting your voice? Yes  VOCAL ABUSE:  Episodes observed during evaluation: habitual throat clearing, +7 during evaluation Characteristics observed during evaluation: single throat clear Pt report of frequency: all the time, throughout the day Pt description of cough episodes:  Identified triggers: Acid reflux , Postnasal drip, and Voice overuse  PERCEPTUAL VOICE ASSESSMENT: Voice quality: hoarse, breathy, low vocal intensity, and vocal fatigue Vocal abuse: habitual throat clearing Resonance: normal Respiratory function: speaking on residual capacity  OBJECTIVE VOICE ASSESSMENT: Maximum phonation time for sustained "ah":  5-6 seconds Conversational pitch average: appears  WNL Conversational loudness average: 67 dB Comment: noted to be highly variable throughout speaking S/z ratio: .8 (Suggestive of dysfunction >1.0)  STIMULIBILITY TRIALS FOR IMPROVED VOCAL QUALITY: Pt able to demonstrate strong, clear vocal quality in lower register when imitating his prior voice quality/strength   Pt does report difficulty with swallowing, per ENT may be 2/2 laryngospasm. SLP will continue to monitor.   PATIENT REPORTED OUTCOME MEASURES (PROM):  Voice Related Quality of Life Measure Pt was asked to rate on a scale of 1-5 how much of a problem different speaking situations have been over the past two weeks     Because of my voice...  Pt Rating  I have trouble speaking loudly or being heard in noisy situations 2  I run out of air and need to take frequent breaths when talking 2  I sometimes do not know what will come out when I begin speaking 4  I am sometimes anxious or frustrated because of my voice 5  I sometimes get depressed because of my voice 2  I have trouble using the telephone because of my voice 2  I have trouble doing my job or practicing  my profession because of my voice 4  I avoid going out socially because of my voice 1  I have to repeat myself to be understood 3  I have become less outgoing because of my voice 1  1= none, not a problem 5= problem is as "bad as it can be"   Calculated score: 60 (>80 is considered WNL)   TODAY'S TREATMENT:                                                                                                                                         10/25/2023: Initiated training and education regarding laryngopharyngeal reflux and reflux management recommendations. Advised of intervention rationale for targeting vocal fold atrophy.    PATIENT EDUCATION: Education details: POC, eval results, reflux precautions Person educated: Patient Education method: Explanation, Demonstration, and Handouts Education comprehension: verbalized  understanding, returned demonstration, and needs further education  HOME EXERCISE PROGRAM: Vocal hygiene   GOALS: Goals reviewed with patient? Yes  SHORT TERM GOALS: Target date: 11/22/2023  Pt will demonstrate understanding of reflux precautions via teach back with mod-I Baseline: Goal status: INITIAL  2.  Pt will accurately demonstrate voice exercises given model and min-A over 2 sessions  Baseline:  Goal status: INITIAL  3.  Pt will demonstrate throat clear alternative in 50% of opportunities with mod-A during 45 minute session Baseline:  Goal status: INITIAL  LONG TERM GOALS: Target date: 01/03/2024  Pt will be independent with voice HEP and vocal hygiene recommendations Baseline:  Goal status: INITIAL  2.  Pt will demonstrate throat clear alternative in 80% of opportunities with rare min-A during 45 minute session Baseline:  Goal status: INITIAL  3.  Pt will increase duration and intensity with clear voicing of sustained phonatory tasks (effortful phonation exercises or vocal function exercises) by 10% by d/c  Baseline: to be established initial therapy session  Goal status: INITIAL  ASSESSMENT:  CLINICAL IMPRESSION: Patient is a 68 y.o. M who was seen today for voice evaluation. Evaluation reveals moderate dysphonia. Pt's voice is c/b hoarse, breathy vocal quality which is c/w findings from ENT visualization of larynx during stroboscopy. Additionally, pt presents with reduced respiratory and phonatory coordination, c/b speaking on residual capacity. Demonstration of habitual throat clearing evidenced this date with pt supporting throat clearing "all the time, all day." Pt reports challenges in meeting vocational demands which involve public speaking. Complete of PROM demonstrates below normal limits perception of voice efficacy. I recommend skilled ST to instruct for vocal exercises to address vocal fold atrophy and improve voice quality.  OBJECTIVE IMPAIRMENTS: include   dysphonia . 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 Factors affecting potential to achieve goals and functional outcome are  n/a . Patient will benefit from skilled SLP services to address above impairments and improve overall function.  REHAB POTENTIAL: Good  PLAN:  SLP FREQUENCY: 2x/week  SLP DURATION: 10 weeks  PLANNED INTERVENTIONS: Cueing hierachy, Internal/external aids, Functional tasks, SLP instruction and feedback, Compensatory strategies, Patient/family education, [phone] Treatment of speech (30 or 45 min) , and 60454- Speech Eval Behavioral Qualitative Voice Resonance  Maia Breslow, CCC-SLP 10/25/2023, 10:35 AM

## 2023-10-24 ENCOUNTER — Encounter (HOSPITAL_BASED_OUTPATIENT_CLINIC_OR_DEPARTMENT_OTHER): Payer: Self-pay | Admitting: Cardiovascular Disease

## 2023-10-25 ENCOUNTER — Encounter: Payer: Self-pay | Admitting: Speech Pathology

## 2023-10-25 ENCOUNTER — Ambulatory Visit: Attending: Otolaryngology | Admitting: Speech Pathology

## 2023-10-25 DIAGNOSIS — J383 Other diseases of vocal cords: Secondary | ICD-10-CM | POA: Insufficient documentation

## 2023-10-25 DIAGNOSIS — R1310 Dysphagia, unspecified: Secondary | ICD-10-CM | POA: Insufficient documentation

## 2023-10-25 DIAGNOSIS — R490 Dysphonia: Secondary | ICD-10-CM | POA: Diagnosis not present

## 2023-10-25 NOTE — Patient Instructions (Addendum)
 ACID REFLUX PRECAUTIONS ACID REFLUX can be a possible cause of a voice disorder or throat irritation. Acid reflux is a disorder where acid from your stomach is abnormally spilled over onto your voice box after eating, during sleep, or even during singing. Acid reflux causes irritation and inflammation to your vocal folds and should be avoided and treated by changing eating habits, changing lifestyle, and taking medication (if prescribed by your doctor).   CHANGE EATING HABITS   Avoid "trigger" foods. Certain foods and drinks can trigger acid reflux.  1. Caffeine- in coffee, tea, chocolate, sodas  2. Carbonated beverages  3. Mint and menthol  4. Fatty/fried foods  5. Citrus fruits  6. Tomato products  7. Spicy foods  8. Alcohol    CHANGING LIFESTYLE HABITS   Drink 8 glasses of water per day (64oz)    Stop smoking   Avoid clearing your throat   Allow 3 hours between last big meal and going to bed at night   Keep yourself upright for one hour after you eat   Elevate the head of your bed using 6-inch blocks under the head of the bed or a bed wedge between the box spring and the mattress.   Eat small meals throughout the day rather than 3 big meals   Eat slowly   Wear loose clothing   TAKING MEDICATION   If prescribed one time a day, take 15-30 minutes before breakfast   If prescribed two times a day, take 15-30 minutes before breakfast and 15- 30 minutes before dinner   CHANGING THE WAY YOU USE YOUR VOICE  "Best voice/ Least effort"        Effortful Swallowing - Squeeze hard with the muscles in your neck while you swallow your  saliva or a sip of water

## 2023-10-27 ENCOUNTER — Encounter (INDEPENDENT_AMBULATORY_CARE_PROVIDER_SITE_OTHER): Payer: Self-pay | Admitting: Otolaryngology

## 2023-11-02 ENCOUNTER — Ambulatory Visit: Attending: Otolaryngology | Admitting: Speech Pathology

## 2023-11-02 DIAGNOSIS — R490 Dysphonia: Secondary | ICD-10-CM | POA: Insufficient documentation

## 2023-11-02 NOTE — Patient Instructions (Addendum)
   Reflux G[employer]met - [employer] - a tube or packets - after each meal and before bed  SOVTE -  semi-occluded vocal tract exercises - relax face, neck and throat - go easy  10 hum bubbles 10 seconds 10x  10 pitch glides high, 10 pitch glides low (sustain the high for a few seconds then stop before you glide down)  Sirens 4 reps 10x  National anthem   PhoRTE  Haaaa - 10 seconds  - good volume but no strain in your neck or throat  Glide Haaa  10 sentence in a high pitch like you are calling to a neighbor over the fence - you should sound a little silly - No Tension but some volume   10 sentences in a deep voice like you are the boss - volume but no tension  10 sentences in your normal pitch with good volume - breathe at the pauses to keep your voice on and strong  Speaking with intent, pausing to breathe and more intensity without tension in your throat

## 2023-11-02 NOTE — Therapy (Signed)
 OUTPATIENT SPEECH LANGUAGE PATHOLOGY VOICE TREATMENT   Patient Name: Henry Rivera MRN: [id number] DOB:May 28, 1956, 68 y.o., male Today's Date: 11/02/2023  PCP: Henry Brunette, MD REFERRING PROVIDER: Ashok Croon, MD  END OF SESSION:  End of Session - 11/02/23 1108     Visit Number 2    Number of Visits 9    Date for SLP Re-Evaluation 01/03/24    Authorization Type [employer] Medicare    Authorization - Visit Number 2    Authorization - Number of Visits 21    Progress Note Due on Visit 10    SLP Start Time 0930    SLP Stop Time  1015    SLP Time Calculation (min) 45 min    Activity Tolerance Patient tolerated treatment well              Past Medical History:  Diagnosis Date   Allergic rhinitis    Allergy    Basal cell carcinoma 03/19/2015   L BACK, SUPERFICIAL, CX3, CAUTERY, 5FU   Basal cell carcinoma (BCC) 07/14/2021   R Upper Arm - CX3, 5FU   Basal cell carcinoma (BCC) 07/14/2021   L Forearm - CX3, 5FU   CAD (coronary artery disease) 11/2010   WITH LAD DES    Cataract    forming- small    Diabetes (HCC) 2007   Exertional dyspnea 10/06/2023   GERD (gastroesophageal reflux disease)    Hyperlipidemia    pt states uses Crestor due to Stent - not HLD    Hypertension    Leg heaviness 10/06/2023   MI (mitral incompetence)    SCCA (squamous cell carcinoma) of skin 07/14/2021   Right Hand - posterior (in situ)   Squamous cell carcinoma of skin 10/27/2009   SCC A/KA, L ELBOW, EXC   Vitamin D deficiency    Past Surgical History:  Procedure Laterality Date   COLONOSCOPY  03/27/2004   2017- #2   CORONARY ANGIOPLASTY WITH STENT PLACEMENT  11/2010   HAMMER TOE SURGERY Right    POLYPECTOMY     VASECTOMY     Patient Active Problem List   Diagnosis Date Noted   Exertional dyspnea 10/06/2023   Leg heaviness 10/06/2023   Diabetic peripheral neuropathy associated with type 2 diabetes mellitus (HCC) 01/14/2020   Coronary artery disease involving native coronary  artery of native heart without angina pectoris 02/21/2014   Hyperlipidemia 02/21/2014   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 No surgical history to support voice changes. Dx with reflux, taking Nexium.   PAIN:  Are you having pain? No  PATIENT GOALS: improved vocal quality, return to singing  OBJECTIVE:  Note: Objective measures were completed at Evaluation unless otherwise noted.  DIAGNOSTIC FINDINGS: 10/20/23 Flexible fiberoptic laryngoscopy with stroboscopy  The nasopharynx was patent without mass or lesion. The scope was passed behind the soft palate and directed toward the base of tongue. The base of tongue was visualized and was symmetric with no apparent masses or abnormal appearing tissue. There  were no signs of a mass or pooling of secretions in the piriform sinuses. The supraglottic structures were normal.   The true vocal cords are mobile. The medial edges were bowed and there was evidence of VF atrophy b/l.Closure was incomplete and there was evidence of supraglottic compression with high-pitch phonation. Periodicity present. There was scant mucus throughout hypopharynx. The mucosal wave and amplitude were intact and symmetric/normal in implitude. There is moderate interarytenoid pachydermia and post cricoid edema. The mucosa appears without lesions.   TODAY'S TREATMENT:                                                                                                                                           11/02/23: Reviewed reflux precautions - Henry Rivera is not using alginate - encouraged alginate. Initiated Semi-Occluded Vocal Tract Exercises (SOVTE)  in water - he required occasional min verbal cues and modeling to eliminate tension in his face and neck during SOVTE. Initiated PhoRTE modifying sustained vowel and glides to start with "h" to reduce tension encourage flow phonation. Sustained vowel required occasional min A to use habitual pitch rather than low pitch - he averaged 7 seconds. Pitch glides with usual min verbal cues and modeling to stop at high pitch before gliding down. High and low pitch sentences with rare min verbal cues for both as Henry Rivera would loose intent and revert to habitual pitch. Reading 8-9 word sentences with increased vocal intenstiy 75dB average resulted in clear phonation which Henry Rivera noted . He required verbal cues and modeling to breathe when he became aphonic. Education that breathy voice requires more breath support- encouraged him to speak with slightly increased vocal intensity with no tension in his face, neck and shoulders.   10/25/2023: Iitiated training and education regarding laryngopharyngeal reflux and reflux management recommendations. Advised of intervention rationale for targeting vocal fold atrophy.    PATIENT EDUCATION: Education details: POC, eval results, reflux precautions Person educated: Patient Education method: Explanation, Demonstration, and Handouts Education comprehension: verbalized understanding, returned demonstration, and needs further education  HOME EXERCISE PROGRAM: Vocal hygiene   GOALS: Goals reviewed with patient? Yes  SHORT TERM GOALS: Target date: 11/22/2023  Pt will demonstrate understanding of reflux precautions via teach back with mod-I Baseline: Goal status: ONGOING  2.  Pt will accurately demonstrate voice exercises given model and min-A over 2 sessions   Baseline:  Goal status: ONGOING  3.  Pt will demonstrate throat clear alternative in 50% of opportunities with mod-A during 45 minute session Baseline:  Goal status: ONGOING  LONG TERM GOALS: Target date: 01/03/2024  Pt will be independent with voice HEP and vocal hygiene recommendations Baseline:  Goal status: ONGOING  2.  Pt will demonstrate throat clear alternative in 80% of opportunities with rare min-A during 45 minute session Baseline:  Goal status:ONGOING  3.  Pt will increase duration and intensity with clear voicing of sustained  phonatory tasks (effortful phonation exercises or vocal function exercises) by 10% by d/c  Baseline: to be established initial therapy session  Goal status: ONGOING  ASSESSMENT:  CLINICAL IMPRESSION: Patient is a 68 y.o. M who was seen today for voice evaluation. Evaluation reveals moderate dysphonia. Pt's voice is c/b hoarse, breathy vocal quality which is c/w findings from ENT visualization of larynx during stroboscopy. Additionally, pt presents with reduced respiratory and phonatory coordination, c/b speaking on residual capacity. Demonstration of habitual throat clearing evidenced this date with pt supporting throat clearing "all the time, all day." Pt reports challenges in meeting vocational demands which involve public speaking. Complete of PROM demonstrates below normal limits perception of voice efficacy. I recommend skilled ST to instruct for vocal exercises to address vocal fold atrophy and improve voice quality.  OBJECTIVE IMPAIRMENTS: include  dysphonia . 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 Factors affecting potential to achieve goals and functional outcome are  n/a . Patient will benefit from skilled SLP services to address above impairments and improve overall function.  REHAB POTENTIAL:  Good  PLAN:  SLP FREQUENCY: 2x/week  SLP DURATION: 10 weeks  PLANNED INTERVENTIONS: Cueing hierachy, Internal/external aids, Functional tasks, SLP instruction and feedback, Compensatory strategies, Patient/family education, [phone] Treatment of speech (30 or 45 min) , and [id number]- Speech Eval Behavioral Qualitative Voice Resonance  Dara Hoyer, CCC-SLP 11/02/2023, 11:19 AM

## 2023-11-04 ENCOUNTER — Ambulatory Visit (INDEPENDENT_AMBULATORY_CARE_PROVIDER_SITE_OTHER)

## 2023-11-04 DIAGNOSIS — I739 Peripheral vascular disease, unspecified: Secondary | ICD-10-CM

## 2023-11-04 LAB — VAS US ABI WITH/WO TBI
Left ABI: 1.13
Right ABI: 1.11

## 2023-11-06 ENCOUNTER — Encounter (HOSPITAL_BASED_OUTPATIENT_CLINIC_OR_DEPARTMENT_OTHER): Payer: Self-pay | Admitting: Cardiovascular Disease

## 2023-11-11 ENCOUNTER — Ambulatory Visit
Admission: RE | Admit: 2023-11-11 | Discharge: 2023-11-11 | Disposition: A | Discharge status: Home / Self Care | Source: Ambulatory Visit | Attending: Orthopedic Surgery | Admitting: Orthopedic Surgery

## 2023-11-11 DIAGNOSIS — Z0000 Encounter for general adult medical examination without abnormal findings: Secondary | ICD-10-CM | POA: Diagnosis not present

## 2023-11-11 DIAGNOSIS — M542 Cervicalgia: Secondary | ICD-10-CM

## 2023-11-11 DIAGNOSIS — E559 Vitamin D deficiency, unspecified: Secondary | ICD-10-CM | POA: Diagnosis not present

## 2023-11-11 DIAGNOSIS — N1831 Chronic kidney disease, stage 3a: Secondary | ICD-10-CM | POA: Diagnosis not present

## 2023-11-11 DIAGNOSIS — E785 Hyperlipidemia, unspecified: Secondary | ICD-10-CM | POA: Diagnosis not present

## 2023-11-11 DIAGNOSIS — E1122 Type 2 diabetes mellitus with diabetic chronic kidney disease: Secondary | ICD-10-CM | POA: Diagnosis not present

## 2023-11-11 DIAGNOSIS — M4802 Spinal stenosis, cervical region: Secondary | ICD-10-CM | POA: Diagnosis not present

## 2023-11-14 ENCOUNTER — Ambulatory Visit: Admitting: Speech Pathology

## 2023-11-14 ENCOUNTER — Encounter: Payer: Self-pay | Admitting: Speech Pathology

## 2023-11-14 DIAGNOSIS — R490 Dysphonia: Secondary | ICD-10-CM | POA: Diagnosis not present

## 2023-11-14 NOTE — Therapy (Signed)
 OUTPATIENT SPEECH LANGUAGE PATHOLOGY VOICE TREATMENT   Patient Name: Henry Rivera MRN: [id number] DOB:Dec 29, 1955, 68 y.o., male Today's Date: 11/14/2023  PCP: Imelda Man, MD REFERRING PROVIDER: Artice Last, MD  END OF SESSION:  End of Session - 11/14/23 0847     Visit Number 3    Number of Visits 9    Date for SLP Re-Evaluation 01/03/24    Authorization Type [employer]    Authorization - Visit Number 3    Authorization - Number of Visits 21    Progress Note Due on Visit 10    SLP Start Time 0845    SLP Stop Time  0930    SLP Time Calculation (min) 45 min    Activity Tolerance Patient tolerated treatment well              Past Medical History:  Diagnosis Date   Allergic rhinitis    Allergy    Basal cell carcinoma 03/19/2015   L BACK, SUPERFICIAL, CX3, CAUTERY, 5FU   Basal cell carcinoma (BCC) 07/14/2021   R Upper Arm - CX3, 5FU   Basal cell carcinoma (BCC) 07/14/2021   L Forearm - CX3, 5FU   CAD (coronary artery disease) 11/2010   WITH LAD DES    Cataract    forming- small    Diabetes (HCC) 2007   Exertional dyspnea 10/06/2023   GERD (gastroesophageal reflux disease)    Hyperlipidemia    pt states uses Crestor due to Stent - not HLD    Hypertension    Leg heaviness 10/06/2023   MI (mitral incompetence)    SCCA (squamous cell carcinoma) of skin 07/14/2021   Right Hand - posterior (in situ)   Squamous cell carcinoma of skin 10/27/2009   SCC A/KA, L ELBOW, EXC   Vitamin D deficiency    Past Surgical History:  Procedure Laterality Date   COLONOSCOPY  03/27/2004   2017- #2   CORONARY ANGIOPLASTY WITH STENT PLACEMENT  11/2010   HAMMER TOE SURGERY Right    POLYPECTOMY     VASECTOMY     Patient Active Problem List   Diagnosis Date Noted   Exertional dyspnea 10/06/2023   Leg heaviness 10/06/2023   Diabetic peripheral neuropathy associated with type 2 diabetes mellitus (HCC) 01/14/2020   Coronary artery disease involving native coronary  artery of native heart without angina pectoris 02/21/2014   Hyperlipidemia 02/21/2014   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 No surgical history to support voice changes. Dx with reflux, taking Nexium.   PAIN:  Are you having pain? No  PATIENT GOALS: improved vocal quality, return to singing  OBJECTIVE:  Note: Objective measures were completed at Evaluation unless otherwise noted.  DIAGNOSTIC FINDINGS: 10/20/23 Flexible fiberoptic laryngoscopy with stroboscopy  The nasopharynx was patent without mass or lesion. The scope was passed behind the soft palate and directed toward the base of tongue. The base of tongue was visualized and was symmetric with no apparent masses or abnormal appearing tissue.  There were no signs of a mass or pooling of secretions in the piriform sinuses. The supraglottic structures were normal.   The true vocal cords are mobile. The medial edges were bowed and there was evidence of VF atrophy b/l.Closure was incomplete and there was evidence of supraglottic compression with high-pitch phonation. Periodicity present. There was scant mucus throughout hypopharynx. The mucosal wave and amplitude were intact and symmetric/normal in implitude. There is moderate interarytenoid pachydermia and post cricoid edema. The mucosa appears without lesions.   TODAY'S TREATMENT:                                                                                                                                           11/14/23: JR reports he has been focusing on doing the loud sentences but not the other exercises. He required occasional min verbal cues and modeling for SOVTE, especially pitch glides and anthem, as he was practicing anthem without straw or bubbles. PhORTE required usual mod modeling and instructions as JR prefers to use low pitch as he gets better phonation, however this is not his speaking voice - education re: as to why voice is better with low pitch, however he needs to exercise his optimal pitch and high pitch to improve vocal fold adduction. With PhoRTE high pitch sentences, JR required modeling to achieve adequate pitch change. Sentences added 10+ words to focus on high intensity voice - encouraged him to practice high intensity voice with scripture and prayers he has memorized as well. Ongoing cues to ID throat clears (5 today) and to use sips or swallows as alterative to throat clearing.   11/02/23: Reviewed reflux precautions - JR is not using alginate - encouraged alginate. Initiated Semi-Occluded Vocal Tract Exercises (SOVTE)  in water - he required occasional min verbal cues and modeling to eliminate tension in his face and neck during SOVTE. Initiated PhoRTE modifying sustained vowel and glides to start with "h" to reduce tension encourage flow phonation. Sustained vowel required occasional min A to use habitual pitch rather than low pitch - he averaged 7 seconds. Pitch glides with usual min verbal cues and modeling to stop at high pitch before gliding down. High and low pitch sentences with rare min verbal cues for both as JR would loose intent and revert to habitual pitch. Reading 8-9 word sentences with increased vocal intenstiy 75dB average resulted in clear phonation which JR noted . He required verbal cues and modeling to breathe when he became aphonic. Education that breathy voice requires more breath support-  encouraged him to speak with slightly increased vocal intensity with no tension in his face, neck and shoulders.   10/25/2023: Iitiated training and education regarding laryngopharyngeal reflux and reflux management recommendations. Advised of intervention rationale for targeting vocal fold atrophy.    PATIENT EDUCATION: Education details: POC, eval results, reflux precautions Person educated: Patient Education method: Explanation, Demonstration, and  Handouts Education comprehension: verbalized understanding, returned demonstration, and needs further education  HOME EXERCISE PROGRAM: Vocal hygiene   GOALS: Goals reviewed with patient? Yes  SHORT TERM GOALS: Target date: 11/22/2023  Pt will demonstrate understanding of reflux precautions via teach back with mod-I Baseline: Goal status: ONGOING  2.  Pt will accurately demonstrate voice exercises given model and min-A over 2 sessions  Baseline:  Goal status: ONGOING  3.  Pt will demonstrate throat clear alternative in 50% of opportunities with mod-A during 45 minute session Baseline:  Goal status: ONGOING  LONG TERM GOALS: Target date: 01/03/2024  Pt will be independent with voice HEP and vocal hygiene recommendations Baseline:  Goal status: ONGOING  2.  Pt will demonstrate throat clear alternative in 80% of opportunities with rare min-A during 45 minute session Baseline:  Goal status:ONGOING  3.  Pt will increase duration and intensity with clear voicing of sustained phonatory tasks (effortful phonation exercises or vocal function exercises) by 10% by d/c  Baseline: to be established initial therapy session  Goal status: ONGOING  ASSESSMENT:  CLINICAL IMPRESSION: Patient is a 68 y.o. M who was seen today for voice evaluation. Evaluation reveals moderate dysphonia. Pt's voice is c/b hoarse, breathy vocal quality which is c/w findings from ENT visualization of larynx during stroboscopy. Additionally, pt presents with reduced  respiratory and phonatory coordination, c/b speaking on residual capacity. Demonstration of habitual throat clearing evidenced this date with pt supporting throat clearing "all the time, all day." Pt reports challenges in meeting vocational demands which involve public speaking. Complete of PROM demonstrates below normal limits perception of voice efficacy. I recommend skilled ST to instruct for vocal exercises to address vocal fold atrophy and improve voice quality.  OBJECTIVE IMPAIRMENTS: include  dysphonia . 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 Factors affecting potential to achieve goals and functional outcome are  n/a . Patient will benefit from skilled SLP services to address above impairments and improve overall function.  REHAB POTENTIAL: Good  PLAN:  SLP FREQUENCY: 2x/week  SLP DURATION: 10 weeks  PLANNED INTERVENTIONS: Cueing hierachy, Internal/external aids, Functional tasks, SLP instruction and feedback, Compensatory strategies, Patient/family education, [phone] Treatment of speech (30 or 45 min) , and [id number]- Speech Eval Behavioral Qualitative Voice Resonance  Lela Purple, CCC-SLP 11/14/2023, 9:34 AM

## 2023-11-14 NOTE — Patient Instructions (Addendum)
   Keep working on eliminating throat clears  Sustained  haaa - in your speaking pitch - as long as you can without strain but with some power  The lower pitch is easier for you because it scrunches up the vocal folds and makes them fatter so they meet in the middle easier. We want to strengthen your speaking voice so sustain your ha at your speaking voice  With  your pitch glides in water and ha - great job keeping power without strain  At the top of your pitch glides, sustain high pitch briefly, then breathe at the top  [place] job using a high pitch and power on your high pitch  sentences (Minnie mouse but without strain)  10 sentences with power voice - 10+ word sentences - breath at commas and pauses in your speaking pitch (if you don't have the sentences you can use anything you have memorized to practice your power voice exercises)

## 2023-11-16 ENCOUNTER — Encounter: Payer: Self-pay | Admitting: Speech Pathology

## 2023-11-16 ENCOUNTER — Ambulatory Visit: Admitting: Speech Pathology

## 2023-11-16 DIAGNOSIS — R490 Dysphonia: Secondary | ICD-10-CM | POA: Diagnosis not present

## 2023-11-16 NOTE — Therapy (Signed)
 OUTPATIENT SPEECH LANGUAGE PATHOLOGY VOICE TREATMENT   Patient Name: Henry Rivera MRN: [id number] DOB:1956-01-12, 68 y.o., male Today's Date: 11/16/2023  PCP: Imelda Man, MD REFERRING PROVIDER: Artice Last, MD  END OF SESSION:  End of Session - 11/16/23 0849     Visit Number 4    Number of Visits 9    Date for SLP Re-Evaluation 01/03/24    Authorization Type [employer] Medicare    Authorization - Visit Number 4    Authorization - Number of Visits 21    SLP Start Time 0845    SLP Stop Time  0930    SLP Time Calculation (min) 45 min    Activity Tolerance Patient tolerated treatment well              Past Medical History:  Diagnosis Date   Allergic rhinitis    Allergy    Basal cell carcinoma 03/19/2015   L BACK, SUPERFICIAL, CX3, CAUTERY, 5FU   Basal cell carcinoma (BCC) 07/14/2021   R Upper Arm - CX3, 5FU   Basal cell carcinoma (BCC) 07/14/2021   L Forearm - CX3, 5FU   CAD (coronary artery disease) 11/2010   WITH LAD DES    Cataract    forming- small    Diabetes (HCC) 2007   Exertional dyspnea 10/06/2023   GERD (gastroesophageal reflux disease)    Hyperlipidemia    pt states uses Crestor due to Stent - not HLD    Hypertension    Leg heaviness 10/06/2023   MI (mitral incompetence)    SCCA (squamous cell carcinoma) of skin 07/14/2021   Right Hand - posterior (in situ)   Squamous cell carcinoma of skin 10/27/2009   SCC A/KA, L ELBOW, EXC   Vitamin D deficiency    Past Surgical History:  Procedure Laterality Date   COLONOSCOPY  03/27/2004   2017- #2   CORONARY ANGIOPLASTY WITH STENT PLACEMENT  11/2010   HAMMER TOE SURGERY Right    POLYPECTOMY     VASECTOMY     Patient Active Problem List   Diagnosis Date Noted   Exertional dyspnea 10/06/2023   Leg heaviness 10/06/2023   Diabetic peripheral neuropathy associated with type 2 diabetes mellitus (HCC) 01/14/2020   Coronary artery disease involving native coronary artery of native heart without  angina pectoris 02/21/2014   Hyperlipidemia 02/21/2014   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 No surgical history to support voice changes. Dx with reflux, taking Nexium.   PAIN:  Are you having pain? No  PATIENT GOALS: improved vocal quality, return to singing  OBJECTIVE:  Note: Objective measures were completed at Evaluation unless otherwise noted.  DIAGNOSTIC FINDINGS: 10/20/23 Flexible fiberoptic laryngoscopy with stroboscopy  The nasopharynx was patent without mass or lesion. The scope was passed behind the soft palate and directed toward the base of tongue. The base of tongue was visualized and was symmetric with no apparent masses or abnormal appearing tissue. There were no signs of a mass or pooling  of secretions in the piriform sinuses. The supraglottic structures were normal.   The true vocal cords are mobile. The medial edges were bowed and there was evidence of VF atrophy b/l.Closure was incomplete and there was evidence of supraglottic compression with high-pitch phonation. Periodicity present. There was scant mucus throughout hypopharynx. The mucosal wave and amplitude were intact and symmetric/normal in implitude. There is moderate interarytenoid pachydermia and post cricoid edema. The mucosa appears without lesions.   TODAY'S TREATMENT:                                                                                                                                           11/16/23: Danette Duos initially required modeling for hum bubbles due to difficulty coordinating phonation and bubbles. After initial modeling he completed 10 hum bubbles with mod I. He required modeling for pitch glide bubbles as well. Other exercises for SOVTE completed with usual mod A to sustain vowel at optimal pitch rather than low pitch which he prefers due to ease. Pitch glides required modeling and verbal cues to maintain volume/intensity throughout glide. High and low pitch sentences completed with rare min A. Oral reading of 10+ word sentences focusing on increased vocal intensity with rare min A JR achieved and maintained clear strong phonation. In conversation JR maintained clear phonation without breathiness with rare min A focusing on increased vocal intensity, averaging 74dB. I suspect his natural volume is slightly higher than 70dB. Attempted to have JR complete automatic speech tasks in his breathy/out of breath voice, however he was not able to achieve this. With verbal cues he Id'd when he used breathy voice and repeated the utterance in clear phonation.   11/14/23: JR reports he has been focusing on doing the loud sentences but not the other exercises. He required occasional min verbal cues and modeling for SOVTE, especially pitch glides and anthem, as he was practicing anthem without straw or bubbles. PhORTE required usual mod modeling and instructions as JR prefers to use low pitch as he gets better phonation, however this is not his speaking voice - education re: as to why voice is better with low pitch, however he needs to exercise his optimal pitch and high pitch to improve vocal fold adduction. With PhoRTE high pitch sentences, JR required modeling to achieve adequate pitch change. Sentences added 10+ words to focus on high intensity voice - encouraged him to practice high intensity voice with scripture and prayers he has memorized as well.  Ongoing cues to ID throat clears (5 today) and to use sips or swallows as alterative to throat clearing.   11/02/23: Reviewed reflux precautions - JR is not using alginate - encouraged alginate. Initiated Semi-Occluded Vocal Tract Exercises (SOVTE)  in water - he required occasional min verbal cues and modeling to eliminate tension in his face and neck during SOVTE. Initiated PhoRTE modifying sustained vowel and glides to  start with "h" to reduce tension encourage flow phonation. Sustained vowel required occasional min A to use habitual pitch rather than low pitch - he averaged 7 seconds. Pitch glides with usual min verbal cues and modeling to stop at high pitch before gliding down. High and low pitch sentences with rare min verbal cues for both as JR would loose intent and revert to habitual pitch. Reading 8-9 word sentences with increased vocal intenstiy 75dB average resulted in clear phonation which JR noted . He required verbal cues and modeling to breathe when he became aphonic. Education that breathy voice requires more breath support- encouraged him to speak with slightly increased vocal intensity with no tension in his face, neck and shoulders.   10/25/2023: Iitiated training and education regarding laryngopharyngeal reflux and reflux management recommendations. Advised of intervention rationale for targeting vocal fold atrophy.    PATIENT EDUCATION: Education details: POC, eval results, reflux precautions Person educated: Patient Education method: Explanation, Demonstration, and Handouts Education comprehension: verbalized understanding, returned demonstration, and needs further education  HOME EXERCISE PROGRAM: Vocal hygiene   GOALS: Goals reviewed with patient? Yes  SHORT TERM GOALS: Target date: 11/22/2023  Pt will demonstrate understanding of reflux precautions via teach back with mod-I Baseline: Goal status: ONGOING  2.  Pt will accurately demonstrate voice exercises given model  and min-A over 2 sessions  Baseline:  Goal status: ONGOING  3.  Pt will demonstrate throat clear alternative in 50% of opportunities with mod-A during 45 minute session Baseline:  Goal status: ONGOING  LONG TERM GOALS: Target date: 01/03/2024  Pt will be independent with voice HEP and vocal hygiene recommendations Baseline:  Goal status: ONGOING  2.  Pt will demonstrate throat clear alternative in 80% of opportunities with rare min-A during 45 minute session Baseline:  Goal status:ONGOING  3.  Pt will increase duration and intensity with clear voicing of sustained phonatory tasks (effortful phonation exercises or vocal function exercises) by 10% by d/c  Baseline: to be established initial therapy session  Goal status: ONGOING  ASSESSMENT:  CLINICAL IMPRESSION: Patient is a 68 y.o. M who was seen today for voice evaluation. Evaluation reveals moderate dysphonia. Pt's voice is c/b hoarse, breathy vocal quality which is c/w findings from ENT visualization of larynx during stroboscopy. Additionally, pt presents with reduced respiratory and phonatory coordination, c/b speaking on residual capacity. Demonstration of habitual throat clearing evidenced this date with pt supporting throat clearing "all the time, all day." Pt reports challenges in meeting vocational demands which involve public speaking. Complete of PROM demonstrates below normal limits perception of voice efficacy. I recommend skilled ST to instruct for vocal exercises to address vocal fold atrophy and improve voice quality.  OBJECTIVE IMPAIRMENTS: include  dysphonia . 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 Factors affecting potential to achieve goals and functional outcome are  n/a . Patient will benefit from skilled SLP services to address above impairments and improve overall function.  REHAB  POTENTIAL: Good  PLAN:  SLP FREQUENCY: 2x/week  SLP DURATION: 10 weeks  PLANNED INTERVENTIONS: Cueing hierachy, Internal/external aids, Functional tasks, SLP instruction and feedback, Compensatory strategies, Patient/family education, [phone] Treatment of speech (30 or 45 min) , and [id number]- Speech Eval Behavioral Qualitative Voice Resonance  Dara Hoyer, CCC-SLP 11/16/2023, 9:23 AM

## 2023-11-16 NOTE — Patient Instructions (Addendum)
   Remember the goal is 10 seconds on hum ha, or as long as you can without strain  You can stop and breathe at the top of the glide - prolong the top note of the glide for an extra second or two  With the pitch glides - the goal is to keep the volume up throughout the glide  This week, pay attention to how you are speaking - when you hear the breathy, out of breath voice - fix it! By increasing volume a little  The goal is to find the sweet spot between enough vocal intensity (volume) to eliminate the out of breath speech

## 2023-11-17 DIAGNOSIS — E785 Hyperlipidemia, unspecified: Secondary | ICD-10-CM | POA: Diagnosis not present

## 2023-11-17 DIAGNOSIS — Z7982 Long term (current) use of aspirin: Secondary | ICD-10-CM | POA: Diagnosis not present

## 2023-11-17 DIAGNOSIS — E559 Vitamin D deficiency, unspecified: Secondary | ICD-10-CM | POA: Diagnosis not present

## 2023-11-17 DIAGNOSIS — K219 Gastro-esophageal reflux disease without esophagitis: Secondary | ICD-10-CM | POA: Diagnosis not present

## 2023-11-17 DIAGNOSIS — Z955 Presence of coronary angioplasty implant and graft: Secondary | ICD-10-CM | POA: Diagnosis not present

## 2023-11-17 DIAGNOSIS — N1831 Chronic kidney disease, stage 3a: Secondary | ICD-10-CM | POA: Diagnosis not present

## 2023-11-17 DIAGNOSIS — Z0000 Encounter for general adult medical examination without abnormal findings: Secondary | ICD-10-CM | POA: Diagnosis not present

## 2023-11-17 DIAGNOSIS — I129 Hypertensive chronic kidney disease with stage 1 through stage 4 chronic kidney disease, or unspecified chronic kidney disease: Secondary | ICD-10-CM | POA: Diagnosis not present

## 2023-11-17 DIAGNOSIS — E1122 Type 2 diabetes mellitus with diabetic chronic kidney disease: Secondary | ICD-10-CM | POA: Diagnosis not present

## 2023-11-22 ENCOUNTER — Ambulatory Visit: Admitting: Speech Pathology

## 2023-11-22 DIAGNOSIS — R490 Dysphonia: Secondary | ICD-10-CM

## 2023-11-22 NOTE — Therapy (Signed)
 OUTPATIENT SPEECH LANGUAGE PATHOLOGY VOICE TREATMENT   Patient Name: Henry Rivera MRN: [id number] DOB:Dec 29, 1955, 68 y.o., male Today's Date: 11/22/2023  PCP: Imelda Man, MD REFERRING PROVIDER: Artice Last, MD  END OF SESSION:  End of Session - 11/22/23 1125     Visit Number --    Number of Visits --    Date for SLP Re-Evaluation --    Authorization Type --    Authorization - Number of Visits --    Progress Note Due on Visit --               Past Medical History:  Diagnosis Date   Allergic rhinitis    Allergy    Basal cell carcinoma 03/19/2015   L BACK, SUPERFICIAL, CX3, CAUTERY, 5FU   Basal cell carcinoma (BCC) 07/14/2021   R Upper Arm - CX3, 5FU   Basal cell carcinoma (BCC) 07/14/2021   L Forearm - CX3, 5FU   CAD (coronary artery disease) 11/2010   WITH LAD DES    Cataract    forming- small    Diabetes (HCC) 2007   Exertional dyspnea 10/06/2023   GERD (gastroesophageal reflux disease)    Hyperlipidemia    pt states uses Crestor due to Stent - not HLD    Hypertension    Leg heaviness 10/06/2023   MI (mitral incompetence)    SCCA (squamous cell carcinoma) of skin 07/14/2021   Right Hand - posterior (in situ)   Squamous cell carcinoma of skin 10/27/2009   SCC A/KA, L ELBOW, EXC   Vitamin D deficiency    Past Surgical History:  Procedure Laterality Date   COLONOSCOPY  03/27/2004   2017- #2   CORONARY ANGIOPLASTY WITH STENT PLACEMENT  11/2010   HAMMER TOE SURGERY Right    POLYPECTOMY     VASECTOMY     Patient Active Problem List   Diagnosis Date Noted   Exertional dyspnea 10/06/2023   Leg heaviness 10/06/2023   Diabetic peripheral neuropathy associated with type 2 diabetes mellitus (HCC) 01/14/2020   Coronary artery disease involving native coronary artery of native heart without angina pectoris 02/21/2014   Hyperlipidemia 02/21/2014   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 No surgical history to support voice changes. Dx with reflux, taking Nexium .   PAIN:  Are you having pain? No  PATIENT GOALS: improved vocal quality, return to singing  OBJECTIVE:  Note: Objective measures were completed at Evaluation unless otherwise noted.  DIAGNOSTIC FINDINGS: 10/20/23 Flexible fiberoptic laryngoscopy with stroboscopy  The nasopharynx was patent without mass or lesion. The scope was passed behind the soft palate and directed toward the base of tongue. The base of tongue was visualized and was symmetric with no apparent masses or abnormal appearing tissue. There were no signs of a mass or pooling of secretions in the piriform sinuses. The supraglottic structures were normal.   The true vocal cords are mobile. The medial edges were bowed and there was evidence of VF atrophy b/l.Closure was incomplete and there  was evidence of supraglottic compression with high-pitch phonation. Periodicity present. There was scant mucus throughout hypopharynx. The mucosal wave and amplitude were intact and symmetric/normal in implitude. There is moderate interarytenoid pachydermia and post cricoid edema. The mucosa appears without lesions.   TODAY'S TREATMENT:                                                                                                                                          11/22/23: Pt shares managing reflux effectively and implementing throat clear alternatives. No throat clearing  demonstrated in today's session. SLP led pt through SOVTE warm up and practice with functional phrases focusing on increased vocal intensity and strong, clear voicing. Pt completed exercises with occasional model and min-A achieving clear, strong phonation on functional phrases with min cues to aid "breathy" vocal quality. SLP initiated conversation training to support carryover of improved phonation and increased vocal intensity throughout functional conversation. Pt participated effectively maintaining improved phonation initially but gradually dropping into breathy voice as conversation continued requiring additional cues to correct. SLP provided pt education on the importance of breath support to facilitate a strong clear voice. Pt endorsing understanding but demonstrating difficulty maintaining strong voice throughout the remainder of today's session. Reiterated importance of daily practice with HEP to help strengthen vocal cords. Planning to further address breath support in future sessions  11/16/23: JR initially required modeling for hum bubbles due to difficulty coordinating phonation and bubbles. After initial modeling he completed 10 hum bubbles with mod I. He required modeling for pitch glide bubbles as well. Other exercises for SOVTE completed with usual mod A to sustain vowel at optimal pitch rather than low pitch which he prefers due to ease. Pitch glides required modeling and verbal cues to maintain volume/intensity throughout glide. High and low pitch sentences completed with rare min A. Oral reading of 10+ word sentences focusing on increased vocal intensity with rare min A JR achieved and maintained clear strong phonation. In conversation JR maintained clear phonation without breathiness with rare min A focusing on increased vocal intensity, averaging 74dB. I suspect his natural volume is slightly higher than 70dB. Attempted to have JR complete automatic speech tasks in his breathy/out of breath  voice, however he was not able to achieve this. With verbal cues he Id'd when he used breathy voice and repeated the utterance in clear phonation.   11/14/23: JR reports he has been focusing on doing the loud sentences but not the other exercises. He required occasional min verbal cues and modeling for SOVTE, especially pitch glides and anthem, as he was practicing anthem without straw or bubbles. PhORTE required usual mod modeling and instructions as JR prefers to use low pitch as he gets better phonation, however this is not his speaking voice - education re: as to why voice is better with low pitch, however he needs to exercise his optimal pitch and high  pitch to improve vocal fold adduction. With PhoRTE high pitch sentences, JR required modeling to achieve adequate pitch change. Sentences added 10+ words to focus on high intensity voice - encouraged him to practice high intensity voice with scripture and prayers he has memorized as well. Ongoing cues to ID throat clears (5 today) and to use sips or swallows as alterative to throat clearing.   11/02/23: Reviewed reflux precautions - JR is not using alginate - encouraged alginate. Initiated Semi-Occluded Vocal Tract Exercises (SOVTE)  in water - he required occasional min verbal cues and modeling to eliminate tension in his face and neck during SOVTE. Initiated PhoRTE modifying sustained vowel and glides to start with "h" to reduce tension encourage flow phonation. Sustained vowel required occasional min A to use habitual pitch rather than low pitch - he averaged 7 seconds. Pitch glides with usual min verbal cues and modeling to stop at high pitch before gliding down. High and low pitch sentences with rare min verbal cues for both as JR would loose intent and revert to habitual pitch. Reading 8-9 word sentences with increased vocal intenstiy 75dB average resulted in clear phonation which JR noted . He required verbal cues and modeling to breathe when he became  aphonic. Education that breathy voice requires more breath support- encouraged him to speak with slightly increased vocal intensity with no tension in his face, neck and shoulders.   10/25/2023: Iitiated training and education regarding laryngopharyngeal reflux and reflux management recommendations. Advised of intervention rationale for targeting vocal fold atrophy.    PATIENT EDUCATION: Education details: POC, eval results, reflux precautions Person educated: Patient Education method: Explanation, Demonstration, and Handouts Education comprehension: verbalized understanding, returned demonstration, and needs further education  HOME EXERCISE PROGRAM: Vocal hygiene   GOALS: Goals reviewed with patient? Yes  SHORT TERM GOALS: Target date: 11/22/2023  Pt will demonstrate understanding of reflux precautions via teach back with mod-I Baseline: Goal status: MET  2.  Pt will accurately demonstrate voice exercises given model and min-A over 2 sessions  Baseline: 11/16/23 11/22/23 Goal status: MET  3.  Pt will demonstrate throat clear alternative in 50% of opportunities with mod-A during 45 minute session Baseline:  Goal status: MET  LONG TERM GOALS: Target date: 01/03/2024  Pt will be independent with voice HEP and vocal hygiene recommendations Baseline:  Goal status: ONGOING  2.  Pt will demonstrate throat clear alternative in 80% of opportunities with rare min-A during 45 minute session Baseline:  Goal status:ONGOING  3.  Pt will increase duration and intensity with clear voicing of sustained phonatory tasks (effortful phonation exercises or vocal function exercises) by 10% by d/c  Baseline: to be established initial therapy session  Goal status: ONGOING  ASSESSMENT:  CLINICAL IMPRESSION: Patient is a 68 y.o. M who was seen today for voice treament. Evaluation revealed moderate dysphonia. Pt's voice is c/b hoarse, breathy vocal quality which is c/w findings from ENT visualization  of larynx during stroboscopy. Additionally, pt presents with reduced respiratory and phonatory coordination, c/b speaking on residual capacity. No throat clearing evidenced this date. Pt maintaining clear, strong voice during phrases with min-A and occasional cues. Difficulty maintaining consistently throughout unstructured conversation. I recommend skilled ST to instruct for vocal exercises to address vocal fold atrophy and improve voice quality.  OBJECTIVE IMPAIRMENTS: include  dysphonia . 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 Factors affecting potential to achieve goals  and functional outcome are  n/a . Patient will benefit from skilled SLP services to address above impairments and improve overall function.  REHAB POTENTIAL: Good  PLAN:  SLP FREQUENCY: 2x/week  SLP DURATION: 10 weeks  PLANNED INTERVENTIONS: Cueing hierachy, Internal/external aids, Functional tasks, SLP instruction and feedback, Compensatory strategies, Patient/family education, [phone] Treatment of speech (30 or 45 min) , and [id number]- Speech Eval Behavioral Qualitative Voice Resonance  [facility], Student-SLP 11/22/2023, 11:35 AM

## 2023-11-25 ENCOUNTER — Encounter: Admitting: Speech Pathology

## 2023-11-25 NOTE — Therapy (Signed)
 OUTPATIENT SPEECH LANGUAGE PATHOLOGY VOICE TREATMENT   Patient Name: Henry Rivera MRN: [id number] DOB:04-10-1956, 68 y.o., male Today's Date: 11/29/2023  PCP: Imelda Man, MD REFERRING PROVIDER: Artice Last, MD  END OF SESSION:  End of Session - 11/29/23 0851     Visit Number 6    Number of Visits 9    Date for SLP Re-Evaluation 01/03/24    Authorization Type [employer]    Authorization - Visit Number 6    Authorization - Number of Visits 21    Progress Note Due on Visit 10    SLP Start Time [phone]    SLP Stop Time  0930    SLP Time Calculation (min) 39 min    Activity Tolerance Patient tolerated treatment well                Past Medical History:  Diagnosis Date   Allergic rhinitis    Allergy    Basal cell carcinoma 03/19/2015   L BACK, SUPERFICIAL, CX3, CAUTERY, 5FU   Basal cell carcinoma (BCC) 07/14/2021   R Upper Arm - CX3, 5FU   Basal cell carcinoma (BCC) 07/14/2021   L Forearm - CX3, 5FU   CAD (coronary artery disease) 11/2010   WITH LAD DES    Cataract    forming- small    Diabetes (HCC) 2007   Exertional dyspnea 10/06/2023   GERD (gastroesophageal reflux disease)    Hyperlipidemia    pt states uses Crestor due to Stent - not HLD    Hypertension    Leg heaviness 10/06/2023   MI (mitral incompetence)    SCCA (squamous cell carcinoma) of skin 07/14/2021   Right Hand - posterior (in situ)   Squamous cell carcinoma of skin 10/27/2009   SCC A/KA, L ELBOW, EXC   Vitamin D deficiency    Past Surgical History:  Procedure Laterality Date   COLONOSCOPY  03/27/2004   2017- #2   CORONARY ANGIOPLASTY WITH STENT PLACEMENT  11/2010   HAMMER TOE SURGERY Right    POLYPECTOMY     VASECTOMY     Patient Active Problem List   Diagnosis Date Noted   Exertional dyspnea 10/06/2023   Leg heaviness 10/06/2023   Diabetic peripheral neuropathy associated with type 2 diabetes mellitus (HCC) 01/14/2020   Coronary artery disease involving native  coronary artery of native heart without angina pectoris 02/21/2014   Hyperlipidemia 02/21/2014   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 No surgical history to support voice changes. Dx with reflux, taking Nexium .   PAIN:  Are you having pain? No  PATIENT GOALS: improved vocal quality, return to singing  OBJECTIVE:  Note: Objective measures were completed at Evaluation unless otherwise noted.  DIAGNOSTIC FINDINGS: 10/20/23 Flexible fiberoptic laryngoscopy with stroboscopy  The nasopharynx was patent without mass or lesion. The scope was passed behind the soft palate and directed toward the base of tongue. The base of tongue was visualized and was symmetric with no apparent masses or abnormal appearing tissue. There were no signs of a mass or pooling  of secretions in the piriform sinuses. The supraglottic structures were normal.   The true vocal cords are mobile. The medial edges were bowed and there was evidence of VF atrophy b/l.Closure was incomplete and there was evidence of supraglottic compression with high-pitch phonation. Periodicity present. There was scant mucus throughout hypopharynx. The mucosal wave and amplitude were intact and symmetric/normal in implitude. There is moderate interarytenoid pachydermia and post cricoid edema. The mucosa appears without lesions.   TODAY'S TREATMENT:                                                                                                                                           11/25/23: SLP led pt through igh resistance phonation exercises with ongoing verbal cues provided for maintaining intensity and duration of sustained phonatory tasks to maxmize effectiveness of exercises. Pt able to achieve good vocal quality with up to 9 second hold during sustained phonation and pitch glides with min-A. During phrase production, led pt through high, low, natural pitch iterations of 3-6 word phrases.  Patient able to maintain clear strong vocal quality with rare min-A.  Throughout exercises patient averaging mid 80 dB.  SLP introduced expiratory muscle strength training (EMST) to further dysphonia and breath support HEP.  Utilize perceived effort scale to set device to 60 cm H2O. Pt completed 15 repetitions over 3 sets during our session. HEP initiated for 5 sets of 5 reps BID for 5 days/week. Adjusted POC due to patient feeling confident in ability to carryover HEP exercises without support from SLP.  Plan to have patient back in clinic in 1 month to follow-up.  11/22/23: Pt shares managing reflux effectively and implementing throat clear alternatives. No throat clearing demonstrated in today's session. SLP led pt through SOVTE warm up and practice with functional phrases focusing on increased vocal intensity and strong, clear voicing. Pt completed exercises with occasional model and min-A achieving clear, strong phonation on functional phrases with min cues to aid "breathy" vocal quality. SLP initiated conversation training to support carryover of improved phonation and increased vocal intensity throughout functional conversation. Pt participated effectively maintaining improved phonation initially but gradually dropping into breathy voice as conversation continued requiring additional cues to correct. SLP provided pt education on the importance of breath support to facilitate a strong clear voice. Pt endorsing understanding but demonstrating difficulty maintaining strong  voice throughout the remainder of today's session. Reiterated importance of daily practice with HEP to help strengthen vocal cords. Planning to further address breath support in future sessions  11/16/23: JR initially required modeling for hum bubbles due to difficulty coordinating phonation and bubbles. After initial modeling he completed 10 hum bubbles with mod I. He required modeling for pitch glide bubbles as well. Other exercises for SOVTE completed with usual mod A to sustain vowel at optimal pitch rather than low pitch which he prefers due to ease. Pitch glides required  modeling and verbal cues to maintain volume/intensity throughout glide. High and low pitch sentences completed with rare min A. Oral reading of 10+ word sentences focusing on increased vocal intensity with rare min A JR achieved and maintained clear strong phonation. In conversation JR maintained clear phonation without breathiness with rare min A focusing on increased vocal intensity, averaging 74dB. I suspect his natural volume is slightly higher than 70dB. Attempted to have JR complete automatic speech tasks in his breathy/out of breath voice, however he was not able to achieve this. With verbal cues he Id'd when he used breathy voice and repeated the utterance in clear phonation.    PATIENT EDUCATION: Education details: POC, eval results, reflux precautions Person educated: Patient Education method: Explanation, Demonstration, and Handouts Education comprehension: verbalized understanding, returned demonstration, and needs further education  HOME EXERCISE PROGRAM: Vocal hygiene   GOALS: Goals reviewed with patient? Yes  SHORT TERM GOALS: Target date: 11/22/2023  Pt will demonstrate understanding of reflux precautions via teach back with mod-I Baseline: Goal status: MET  2.  Pt will accurately demonstrate voice exercises given model and min-A over 2 sessions  Baseline: 11/16/23 11/22/23 Goal status: MET  3.  Pt will  demonstrate throat clear alternative in 50% of opportunities with mod-A during 45 minute session Baseline:  Goal status: MET  LONG TERM GOALS: Target date: 01/03/2024  Pt will be independent with voice HEP and vocal hygiene recommendations Baseline:  Goal status: ONGOING  2.  Pt will demonstrate throat clear alternative in 80% of opportunities with rare min-A during 45 minute session Baseline:  Goal status:ONGOING  3.  Pt will increase duration and intensity with clear voicing of sustained phonatory tasks (effortful phonation exercises or vocal function exercises) by 10% by d/c  Baseline: to be established initial therapy session  Goal status: ONGOING  ASSESSMENT:  CLINICAL IMPRESSION: Patient is a 68 y.o. M who was seen today for voice treament. Evaluation revealed moderate dysphonia. Pt's voice is c/b hoarse, breathy vocal quality which is c/w findings from ENT visualization of larynx during stroboscopy. Additionally, pt presents with reduced respiratory and phonatory coordination, c/b speaking on residual capacity. No throat clearing evidenced this date. Pt maintaining clear, strong voice during phrases with min-A and occasional cues. Difficulty maintaining consistently throughout unstructured conversation. I recommend skilled ST to instruct for vocal exercises to address vocal fold atrophy and improve voice quality.  OBJECTIVE IMPAIRMENTS: include  dysphonia . 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 Factors affecting potential to achieve goals and functional outcome are  n/a . Patient will benefit from skilled SLP services to address above impairments and improve overall function.  REHAB POTENTIAL: Good  PLAN:  SLP FREQUENCY: 2x/week  SLP DURATION: 10 weeks  PLANNED INTERVENTIONS: Cueing hierachy, Internal/external aids, Functional tasks, SLP  instruction and feedback, Compensatory strategies, Patient/family education, [phone] Treatment of speech (30 or 45 min) , and [id number]- Speech Eval Behavioral Qualitative Voice Resonance  Alston Jerry, CCC-SLP 11/29/2023, 8:52 AM

## 2023-11-29 ENCOUNTER — Ambulatory Visit: Admitting: Speech Pathology

## 2023-11-29 DIAGNOSIS — R490 Dysphonia: Secondary | ICD-10-CM | POA: Diagnosis not present

## 2023-12-05 ENCOUNTER — Encounter: Payer: Self-pay | Admitting: Speech Pathology

## 2023-12-16 DIAGNOSIS — M25511 Pain in right shoulder: Secondary | ICD-10-CM | POA: Diagnosis not present

## 2023-12-20 ENCOUNTER — Ambulatory Visit (HOSPITAL_BASED_OUTPATIENT_CLINIC_OR_DEPARTMENT_OTHER): Payer: Medicare Other | Admitting: Cardiovascular Disease

## 2023-12-23 ENCOUNTER — Ambulatory Visit: Attending: Otolaryngology | Admitting: Speech Pathology

## 2023-12-23 DIAGNOSIS — R1310 Dysphagia, unspecified: Secondary | ICD-10-CM | POA: Insufficient documentation

## 2023-12-23 DIAGNOSIS — R490 Dysphonia: Secondary | ICD-10-CM | POA: Insufficient documentation

## 2023-12-23 NOTE — Patient Instructions (Signed)
 Bubble in water Sustained phonation  Sirens  Hum a song Aeronautical engineer)  Phorte "Haaa" hold for 10 seconds  "Haaa" glide up and hold  "Haaa" glide down and hold  Phrases -- 10 in high "calling" pitch Phrases -- 10 in low "authoritative" pitch  Phrases -- 10 in your habitual voice using good breath support, avoiding breathy voice quality   Use good breath support for all exercises

## 2023-12-23 NOTE — Therapy (Signed)
 OUTPATIENT SPEECH LANGUAGE PATHOLOGY VOICE TREATMENT   Patient Name: Henry Rivera MRN: [id number] DOB:Jan 02, 1956, 68 y.o., male Today's Date: 12/23/2023  PCP: Imelda Man, MD REFERRING PROVIDER: Artice Last, MD  END OF SESSION:  End of Session - 12/23/23 0848     Visit Number 7    Number of Visits 9    Date for SLP Re-Evaluation 01/03/24    Authorization Type [employer]    Authorization - Number of Visits 21    Progress Note Due on Visit 10    SLP Start Time 0849    SLP Stop Time  0930    SLP Time Calculation (min) 41 min    Activity Tolerance Patient tolerated treatment well                 Past Medical History:  Diagnosis Date   Allergic rhinitis    Allergy    Basal cell carcinoma 03/19/2015   L BACK, SUPERFICIAL, CX3, CAUTERY, 5FU   Basal cell carcinoma (BCC) 07/14/2021   R Upper Arm - CX3, 5FU   Basal cell carcinoma (BCC) 07/14/2021   L Forearm - CX3, 5FU   CAD (coronary artery disease) 11/2010   WITH LAD DES    Cataract    forming- small    Diabetes (HCC) 2007   Exertional dyspnea 10/06/2023   GERD (gastroesophageal reflux disease)    Hyperlipidemia    pt states uses Crestor due to Stent - not HLD    Hypertension    Leg heaviness 10/06/2023   MI (mitral incompetence)    SCCA (squamous cell carcinoma) of skin 07/14/2021   Right Hand - posterior (in situ)   Squamous cell carcinoma of skin 10/27/2009   SCC A/KA, L ELBOW, EXC   Vitamin D deficiency    Past Surgical History:  Procedure Laterality Date   COLONOSCOPY  03/27/2004   2017- #2   CORONARY ANGIOPLASTY WITH STENT PLACEMENT  11/2010   HAMMER TOE SURGERY Right    POLYPECTOMY     VASECTOMY     Patient Active Problem List   Diagnosis Date Noted   Exertional dyspnea 10/06/2023   Leg heaviness 10/06/2023   Diabetic peripheral neuropathy associated with type 2 diabetes mellitus (HCC) 01/14/2020   Coronary artery disease involving native coronary artery of native heart without  angina pectoris 02/21/2014   Hyperlipidemia 02/21/2014   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 No surgical history to support voice changes. Dx with reflux, taking Nexium .   PAIN:  Are you having pain? No  PATIENT GOALS: improved vocal quality, return to singing  OBJECTIVE:  Note: Objective measures were completed at Evaluation unless otherwise noted.  DIAGNOSTIC FINDINGS: 10/20/23 Flexible fiberoptic laryngoscopy with stroboscopy  The nasopharynx was patent without mass or lesion. The scope was passed behind the soft palate and directed toward the base of tongue. The base of tongue was visualized and was symmetric with no apparent masses or abnormal appearing tissue. There were no signs of a mass or pooling of secretions in the piriform sinuses. The supraglottic structures were  normal.   The true vocal cords are mobile. The medial edges were bowed and there was evidence of VF atrophy b/l.Closure was incomplete and there was evidence of supraglottic compression with high-pitch phonation. Periodicity present. There was scant mucus throughout hypopharynx. The mucosal wave and amplitude were intact and symmetric/normal in implitude. There is moderate interarytenoid pachydermia and post cricoid edema. The mucosa appears without lesions.  SPEECH THERAPY DISCHARGE SUMMARY  Visits from Start of Care: 7  Current functional level related to goals / functional outcomes: Pt demonstrates mod-I completion of  vocal exercises. Able to carryover strong voice without breathiness in structured tasks. Improved perception of voice evidenced via PROM.    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 Patient goals were met. Patient is being discharged due to meeting the stated rehab goals.    TODAY'S TREATMENT:          12/23/23: re administered PROM, see below. Usual evidence of breathy vocal quality in conversation this date. See below. SLP had pt use EMST for 5 reps at previously set setting. Pt rates effort at 4 on 10 perceived effort scale. SLP increased until pt rating 5 repetitions as 7 on 10 perceived effort scale. SLP provided modeling and instruction to avoid rushing through and reducing quality of repetitions. Led pt through modified high resistance phonation exercises with pt demonstrating sustained phonation, glides, and phrases with clear, strong vocal quality rare min-A. Provided handout for ongoing HEP to support ongoing improvement in voice.   Voice Related Quality of Life Measure Pt was asked to rate on a scale of 1-5 how much of a problem different speaking situations have been over the past two weeks     Because of my voice...  Pt Rating  I have trouble speaking loudly or being heard in noisy situations 3  I run out of air and need to take frequent breaths when talking 4  I sometimes do not know what will come out when I begin speaking 2  I am sometimes anxious or frustrated because of my voice 2  I sometimes get depressed because of my voice 1  I have trouble using the telephone because of my voice 3  I have trouble doing my job or practicing my profession because of my voice 3  I avoid going out socially because of my voice 1  I have to repeat myself to be understood 3   I have become less outgoing because of my voice 1  1= none, not a problem 5= problem is as "bad as it can be"  Calculated score of 70 (>80 calculated score is considered WNL)  11/25/23: SLP led pt through igh resistance phonation exercises with ongoing verbal cues provided for maintaining intensity and duration of sustained phonatory tasks to maxmize effectiveness of exercises. Pt able to achieve good vocal quality with up to 9 second hold during sustained phonation and pitch glides with min-A. During phrase production, led pt through high, low, natural pitch iterations of 3-6 word phrases.  Patient able to maintain clear strong vocal quality with rare min-A.  Throughout exercises patient averaging mid 80 dB.  SLP introduced expiratory muscle strength training (EMST) to further dysphonia and breath support HEP.  Utilize perceived effort scale to set device to 60 cm H2O. Pt completed 15 repetitions over 3 sets  during our session. HEP initiated for 5 sets of 5 reps BID for 5 days/week. Adjusted POC due to patient feeling confident in ability to carryover HEP exercises without support from SLP.  Plan to have patient back in clinic in 1 month to follow-up.  11/22/23: Pt shares managing reflux effectively and implementing throat clear alternatives. No throat clearing demonstrated in today's session. SLP led pt through SOVTE warm up and practice with functional phrases focusing on increased vocal intensity and strong, clear voicing. Pt completed exercises with occasional model and min-A achieving clear, strong phonation on functional phrases with min cues to aid "breathy" vocal quality. SLP initiated conversation training to support carryover of improved phonation and increased vocal intensity throughout functional conversation. Pt participated effectively maintaining improved phonation initially but gradually dropping into breathy voice as conversation continued requiring additional cues to correct. SLP provided  pt education on the importance of breath support to facilitate a strong clear voice. Pt endorsing understanding but demonstrating difficulty maintaining strong voice throughout the remainder of today's session. Reiterated importance of daily practice with HEP to help strengthen vocal cords. Planning to further address breath support in future sessions  11/16/23: JR initially required modeling for hum bubbles due to difficulty coordinating phonation and bubbles. After initial modeling he completed 10 hum bubbles with mod I. He required modeling for pitch glide bubbles as well. Other exercises for SOVTE completed with usual mod A to sustain vowel at optimal pitch rather than low pitch which he prefers due to ease. Pitch glides required modeling and verbal cues to maintain volume/intensity throughout glide. High and low pitch sentences completed with rare min A. Oral reading of 10+ word sentences focusing on increased vocal intensity with rare min A JR achieved and maintained clear strong phonation. In conversation JR maintained clear phonation without breathiness with rare min A focusing on increased vocal intensity, averaging 74dB. I suspect his natural volume is slightly higher than 70dB. Attempted to have JR complete automatic speech tasks in his breathy/out of breath voice, however he was not able to achieve this. With verbal cues he Id'd when he used breathy voice and repeated the utterance in clear phonation.    PATIENT EDUCATION: Education details: POC, eval results, reflux precautions Person educated: Patient Education method: Explanation, Demonstration, and Handouts Education comprehension: verbalized understanding, returned demonstration, and needs further education  HOME EXERCISE PROGRAM: Vocal hygiene   GOALS: Goals reviewed with patient? Yes  SHORT TERM GOALS: Target date: 11/22/2023  Pt will demonstrate understanding of reflux precautions via teach back with mod-I Baseline: Goal  status: MET  2.  Pt will accurately demonstrate voice exercises given model and min-A over 2 sessions  Baseline: 11/16/23 11/22/23 Goal status: MET  3.  Pt will demonstrate throat clear alternative in 50% of opportunities with mod-A during 45 minute session Baseline:  Goal status: MET  LONG TERM GOALS: Target date: 01/03/2024  Pt will be independent with voice HEP and vocal hygiene recommendations Baseline:  Goal status: MET  2.  Pt will demonstrate throat clear alternative in 80% of opportunities with rare min-A during 45 minute session Baseline:  Goal status:MET  3.  Pt will increase duration and intensity with clear voicing of sustained phonatory tasks (effortful phonation exercises or vocal function exercises) by 10% by d/c  Baseline: to be established initial therapy session  Goal status: MET  ASSESSMENT:  CLINICAL IMPRESSION: Patient is a 68 y.o. M who was seen today for voice treament. Evaluation revealed moderate dysphonia. No throat  clearing evidenced this date. Pt maintaining clear, strong voice during phrases with min-A and occasional cues. Difficulty maintaining consistently throughout unstructured conversation but improving awareness. Pt agreeable to ST d/c will continue with home practice. Handout provided.   OBJECTIVE IMPAIRMENTS: include dysphonia. 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 Factors affecting potential to achieve goals and functional outcome are n/a. Patient will benefit from skilled SLP services to address above impairments and improve overall function.  REHAB POTENTIAL: Good  PLAN:  SLP FREQUENCY: 2x/week  SLP DURATION: 10 weeks  PLANNED INTERVENTIONS: Cueing hierachy, Internal/external aids, Functional tasks, SLP instruction and feedback, Compensatory strategies, Patient/family education, [phone] Treatment of speech  (30 or 45 min) , and [id number]- Speech Eval Behavioral Qualitative Voice Resonance  Alston Jerry, CCC-SLP 12/23/2023, 8:49 AM

## 2023-12-28 ENCOUNTER — Encounter: Payer: Self-pay | Admitting: Speech Pathology

## 2023-12-31 DIAGNOSIS — R0981 Nasal congestion: Secondary | ICD-10-CM | POA: Diagnosis not present

## 2023-12-31 DIAGNOSIS — R059 Cough, unspecified: Secondary | ICD-10-CM | POA: Diagnosis not present

## 2024-01-04 ENCOUNTER — Encounter: Payer: Self-pay | Admitting: Speech Pathology

## 2024-01-26 ENCOUNTER — Telehealth (HOSPITAL_BASED_OUTPATIENT_CLINIC_OR_DEPARTMENT_OTHER): Payer: Self-pay | Admitting: Cardiovascular Disease

## 2024-01-26 ENCOUNTER — Ambulatory Visit (INDEPENDENT_AMBULATORY_CARE_PROVIDER_SITE_OTHER): Admitting: Otolaryngology

## 2024-01-26 VITALS — BP 141/87 | HR 75

## 2024-01-26 DIAGNOSIS — R0981 Nasal congestion: Secondary | ICD-10-CM

## 2024-01-26 DIAGNOSIS — R490 Dysphonia: Secondary | ICD-10-CM

## 2024-01-26 DIAGNOSIS — K219 Gastro-esophageal reflux disease without esophagitis: Secondary | ICD-10-CM

## 2024-01-26 DIAGNOSIS — R0982 Postnasal drip: Secondary | ICD-10-CM

## 2024-01-26 DIAGNOSIS — J3089 Other allergic rhinitis: Secondary | ICD-10-CM

## 2024-01-26 DIAGNOSIS — J383 Other diseases of vocal cords: Secondary | ICD-10-CM | POA: Diagnosis not present

## 2024-01-26 NOTE — Progress Notes (Signed)
 ENT Progress Note:   Update 01/26/2024  Discussed the use of AI scribe software for clinical note transcription with the patient, who gave verbal consent to proceed.  History of Present Illness Henry Rivera. is a 68 year old male who presents with persistent breathy voice and difficulty speaking at a normal volume.  He has been experiencing a breathy voice when speaking at a normal volume, although he can speak loudly and softly without difficulty. He describes his condition as being like 'a three gear car without second gear.'  He has been undergoing speech therapy, which includes exercises such as blowing bubbles and holding notes. However, performing these exercises twice daily for a week worsened his voice, leading to a significant deterioration one Wednesday night, where he could barely speak and had to cut his message short. During therapy sessions, he can perform certain vocal tasks well, such as using a 'radio voice,' but struggles with tasks requiring a normal speaking voice.  Previous treatments with Bactrim  and steroids did not provide significant improvement. He has a history of mucus drainage for years, which he wonders might be contributing to his vocal issues.  No trouble with swallowing. Records Reviewed:  Initial Evaluation  Reason for Consult: chronic dysphonia  choking episodes   HPI: Discussed the use of AI scribe software for clinical note transcription with the patient, who gave verbal consent to proceed.  History of Present Illness   Henry Rivera. 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   He experiences choking episodes two to three times a week, which can occur while swallowing liquids or spontaneously, such as when watching TV from swallowing his own saliva. These episodes involve coughing until the sensation eases. His wife is concerned about the potential impact on his  heart, as he has a stent in place and has hx of CAD.  Over the past two to three months, he has experienced voice changes, with his voice becoming weaker and more breathy, described as almost a whisper. He has not had any recent illnesses or surgeries that could explain this change. 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  He has been diagnosed with reflux laryngitis and was informed about mucus drainage around his vocal cords after seeing Dr Luciano ENT. He has attempted dietary changes, reducing his intake of caffeine-free diet Mountain Dew from six to one or two per day, and cutting back on pizza. However, he finds it challenging to adhere to dietary restrictions recommended.  He has a history of allergies to dust mites and red oaks, which previously led to frequent bronchitis episodes, occurring three to six times a year. Allergy testing and lung x-rays were conducted, and he was advised to avoid mowing grass, which he enjoys.  He experiences shortness of breath and leg pain/fatigue after climbing stairs, which resolves after 30 seconds. He has a history of a stent placement in the LAD due to a 90% blockage in April 2012. He is currently awaiting results from a recent nuclear stress test and plans to have a Doppler study on his legs.  He takes baby aspirin and Nexium , the latter of which was increased to twice daily before an endoscopy a few months ago, then reduced back to once daily. The endoscopy revealed a small hiatal hernia, which has been stable for 30 years.      Records Reviewed:  Office visit with Dr Luciano ENT 09/09/23 He has been experiencing intermittent episodes of choking, reminiscent of food entering the windpipe, despite being on saliva. These episodes occur approximately once every few weeks. A few months prior, he began to notice an impact on his voice, which is significant given his roles as a Education officer, environmental and bass singer. He reports that his  ability to speak loudly fluctuates. He also reports persistent drainage. He has no history of smoking or alcohol consumption. His diet includes pizza once or twice a week, occasional orange juice, and a daily intake of caffeine-free Diet Mountain Dew. He typically consumes 3 meals a day and does not snack after 10:00 PM. An upper GI endoscopy performed 2 weeks ago by Dr. Leigh revealed no abnormalities. However, the presence of food in his stomach was noted, despite fasting since 10:00 PM the previous night. Consequently, esophageal dilation was not performed. He was referred to an ENT specialist and advised to undergo a laryngoscopy. He has been on Nexium  for several years for GERD.  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 It is not getting bigger and is not causing pain   MEDICAL DECISION MAKING IMPRESSION: 1. LPRD (laryngopharyngeal reflux disease)  2. Dysphonia  3. Gastroesophageal reflux disease without esophagitis  4. Lipoma of neck  5. Hiatal hernia   RECOMMENDATIONS:  Discussed that I do not appreciate any vocal fold immobility, vocal fold lesions, or vocal fold atrophy/presbylarynges. There was copious pharyngeal and/or laryngeal mucous present on exam suggestive of irritation from possible acid reflux/LPR. I have recommended changing his AM Nexium  to night time (30 minutes prior to dinner dosing to improve nocturnal acid control) as well as dietary and lifestyle modifications to reduce acid production. GERD/LPR Handout provided and reviewed in detail with the patient. Specifically the patient was instructed to cut back on pizza, diet [facility] and chocolate as well as eating after 6 PM. Discussed that LPR patients often do not have classic signs of GERD (heartburn), but instead may have hoarseness, throat clearing, dysphagia, increased phlegm, and globus sensation.  If no improvement in several months we will consider  referral to laryngologist given professional voice complaints i.e. 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     Past Medical History:  Diagnosis Date   Allergic rhinitis    Allergy    Basal cell carcinoma 03/19/2015   L BACK, SUPERFICIAL, CX3, CAUTERY, 5FU   Basal cell carcinoma (BCC) 07/14/2021   R Upper Arm - CX3, 5FU   Basal cell carcinoma (BCC) 07/14/2021   L Forearm - CX3, 5FU   CAD (coronary artery disease) 11/2010   WITH LAD DES    Cataract    forming- small    Diabetes (HCC) 2007   Exertional dyspnea 10/06/2023   GERD (gastroesophageal reflux disease)    Hyperlipidemia    pt states uses Crestor due to Stent - not HLD    Hypertension    Leg heaviness 10/06/2023   MI (mitral incompetence)    SCCA (squamous cell carcinoma) of skin 07/14/2021   Right Hand - posterior (in situ)   Squamous cell carcinoma of skin 10/27/2009   SCC A/KA, L ELBOW, EXC   Vitamin D deficiency     Past Surgical History:  Procedure Laterality Date   COLONOSCOPY  03/27/2004   2017- #2   CORONARY ANGIOPLASTY WITH STENT PLACEMENT  11/2010  HAMMER TOE SURGERY Right    POLYPECTOMY     VASECTOMY      Family History  Problem Relation Age of Onset   Diabetes Mother    Pulmonary embolism Father    Diabetes Sister    Hepatitis Brother    Diabetes Paternal Grandfather    Heart failure Paternal Grandfather        CHF   Colon cancer Neg Hx    Colon polyps Neg Hx    Esophageal cancer Neg Hx    Rectal cancer Neg Hx    Stomach cancer Neg Hx     Social History:  reports that he has never smoked. He has never used smokeless tobacco. He reports that he does not drink alcohol and does not use drugs.  Allergies:  Allergies  Allergen Reactions   Augmentin [Amoxicillin-Pot Clavulanate]     Gi sx   Fluogen [Influenza Virus Vaccine]     Other reaction(s): Unknown   Haemophilus B Polysaccharide Vaccine Other (See Comments)     Other reaction(s): Unknown  Haemophilus influenzae type b    Medications: I have reviewed the patient's current medications.  The PMH, PSH, Medications, Allergies, and SH were reviewed and updated.  ROS: Constitutional: Negative for fever, weight loss and weight gain. Cardiovascular: Negative for chest pain and dyspnea on exertion. Respiratory: Is not experiencing shortness of breath at rest. Gastrointestinal: Negative for nausea and vomiting. Neurological: Negative for headaches. Psychiatric: The patient is not nervous/anxious  Blood pressure (!) 141/87, pulse 75, SpO2 97%. There is no height or weight on file to calculate BMI.  PHYSICAL EXAM:  Exam: General: Well-developed, well-nourished Communication and Voice: Clear pitch and clarity Respiratory Respiratory effort: Equal inspiration and expiration without stridor Cardiovascular Peripheral Vascular: Warm extremities with equal color/perfusion Eyes: No nystagmus with equal extraocular motion bilaterally Neuro/Psych/Balance: Patient oriented to person, place, and time; Appropriate mood and affect; Gait is intact with no imbalance; Cranial nerves I-XII are intact Head and Face Inspection: Normocephalic and atraumatic without mass or lesion Palpation: Facial skeleton intact without bony stepoffs Salivary Glands: No mass or tenderness Facial Strength: Facial motility symmetric and full bilaterally ENT Pinna: External ear intact and fully developed External canal: Canal is patent with intact skin Tympanic Membrane: Clear and mobile External Nose: No scar or anatomic deformity Internal Nose: Septum is deviated to the left. No polyp, or purulence. Mucosal edema and erythema present.  Bilateral inferior turbinate hypertrophy.  Lips, Teeth, and gums: Mucosa and teeth intact and viable TMJ: No pain to palpation with full mobility Oral cavity/oropharynx: No erythema or exudate, no lesions present Nasopharynx: No mass or lesion  with intact mucosa Hypopharynx: Intact mucosa without pooling of secretions Larynx Glottic: Full true vocal cord mobility without lesion or mass VF atrophy glottic insufficiency. No mucus today. Glottic gap seen with adduction. No compression of supraglottic structures.  Supraglottic: Normal appearing epiglottis and AE folds Interarytenoid Space: Moderate pachydermia&edema Subglottic Space: Patent without lesion or edema Neck Neck and Trachea: Midline trachea without mass or lesion Thyroid : No mass or nodularity Lymphatics: No lymphadenopathy  Procedure:  Preoperative diagnosis: dysphonia  Postoperative diagnosis:   Same, VF atrophy and glottic insufficiency  Procedure: Flexible fiberoptic laryngoscopy  Surgeon: Elena Larry, MD  Anesthesia: Topical lidocaine and Afrin Complications: None Condition is stable throughout exam  Indications and consent:  The patient presents to the clinic with above symptoms. Indirect laryngoscopy view was incomplete. Thus it was recommended that they undergo a flexible fiberoptic laryngoscopy. All of the  risks, benefits, and potential complications were reviewed with the patient preoperatively and verbal informed consent was obtained.  Procedure: The patient was seated upright in the clinic. Topical lidocaine and Afrin were applied to the nasal cavity. After adequate anesthesia had occurred, I then proceeded to pass the flexible telescope into the nasal cavity. The nasal cavity was patent without rhinorrhea or polyp. The nasopharynx was also patent without mass or lesion. The base of tongue was visualized and was normal. There were no signs of pooling of secretions in the piriform sinuses. The true vocal folds were mobile bilaterally. There were no signs of glottic or supraglottic mucosal lesion or mass. There was moderate interarytenoid pachydermia and post cricoid edema. The telescope was then slowly withdrawn and the patient tolerated the procedure  throughout.   Studies Reviewed: EGD IMPRESSION 09-22-2023 Dr. Arlene  Impression: - Esophagogastric landmarks identified. - 1 cm hiatal hernia. - Normal esophagus otherwise - no stenosis /  stricture, no Barrett's - I was going to perform  empiric dilation of his esophagus but did not do so  given aspiration risk for residual food in the  stomach. - A medium amount of food (residue) in the stomach. - Normal stomach otherwise. - Normal examined duodenum.   CXR 06/02/2022 FINDINGS: The heart size and mediastinal contours are within normal limits. Both lungs are clear. The visualized skeletal structures are unremarkable.   IMPRESSION: No active disease.   Assessment/Plan: Encounter Diagnoses  Name Primary?   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  Videostrobe today: The true vocal cords are mobile. The medial edges were bowed and there was evidence of VF atrophy b/l.Closure was incomplete and there was evidence of supraglottic compression with high-pitch phonation. Periodicity present. There was scant mucus throughout hypopharynx. The mucosal wave and amplitude were intact and symmetric/normal in implitude. There is moderate interarytenoid pachydermia and post cricoid edema.  We discussed that his voice changes are likely multifactorial but one of the contributing factors is vocal fold atrophy noted on exam today. Likely age-related, as it commonly begins in the fifties and sixties. Reports a change in voice over the past two to three months, with frequent voice use as a Education officer, environmental and  singer. No evidence of phonotrauma, such as bruising or thickening of the vocal cords. Discussed treatment options including voice therapy, filler injection, or medialization thyroplasty surgery. Voice therapy is the initial approach to optimize breath support and improve voice quality. Further interventions considered if voice therapy alone is insufficient.  Also had evidence of thick mucus in hypopharynx and this could be a sign of significant postnasal drainage versus chronic laryngitis.  There were also findings of GERD LPR.  Will medically address both. - Refer to speech therapy for voice therapy to optimize breath support and improve voice quality. - Continue regular voice use as there is no evidence of phonotrauma. - Consider other interventions if no improvement with speech therapy -Medical management of chronic laryngitis as below -Medical management of postnasal drainage and GERD LPR as below  Chronic laryngitis Exam with scant thick  mucus in hypopharynx and along laryngeal structures etiology unclear, but may be related to postnasal drainage or reflux irritation, versus laryngitis sicca syndrome, versus chronic laryngitis. Has reflux and environmental allergies to dust mites and red oaks. Treatment includes a combination of antibiotic, steroid, and antifungal for chronic laryngitis. Discussed the potential for short-term blood sugar elevation due to steroid use, especially given his diabetes. - Prescribe Bactrim , Diflucan , and Medrol  Pak to address potential bacterial, fungal, or inflammatory causes of laryngitis. - nasal saline rinses to reduce nasal mucus and postnasal drip. - Continue Allegra daily and Flonase  twice daily for allergy management.  Chronic nasal congestion and post-nasal drainage Evidence of post-nasal drainage/nasal crusting during scope exam today, could be contributing to her sx -Continue Allegra daily and start Flonase  2 puffs b/l nares BID - consider nasal saline  rinses   GERD LPR Reflux irritation may contribute to laryngospasm (choking episodes could be related to laryngospasm ). Made some dietary changes but continues to experience symptoms. Currently taking Nexium . Discussed the use of a Reflux Gourmet to be taken after meals to prevent reflux. Provided dietary guidance and resources for managing reflux, including a website with reflux-friendly recipes. - Continue Nexium  as prescribed. -  Reflux Gourmet after meals - diet and lifestyle changes to minimize GERD - Refer to [employer] blog for dietary and lifestyle modifications/reflux cook book.  Laryngospasm Experiences choking episodes two to three times a week, which may be related to laryngospasm. Could be due to reflux or vocal cord atrophy, as incomplete vocal cord closure can trigger a cough reflex when mucus or other irritants contact the vocal cords. Addressing underlying reflux and mucus issues is essential. - Address underlying reflux and mucus issues as above  Follow-up Requires follow-up to assess the effectiveness of the treatment plan and determine if further interventions are necessary. - Schedule follow-up appointment in a couple of months after speech therapy  Update 01/26/2024 Assessment and Plan Assessment & Plan Dysphonia  Vocal cord atrophy with breathy voice. Previous treatments ineffective. Scope exam shows VF atrophy and glottic gap but no supraglottic compression. No evidence of AdSD or AbSD with phonatory tasks and sentences, but he does have intermittent breathy quality when talking and it is somewhat inconsistent.  - we discussed Restylane Injection augmentation risks and benefits and he would like to proceed - Provided detailed procedure expectations. - Advised him to bring a ride for the procedure. - Consider Botox injection if filler injection does not improve symptoms, low dose targeting PCA for suspected Abductor spasms although his evaluation is not c/w it.   - Discussed potential for permanent implant if filler injection is effective but voice declines in 9-12 months.   Elena Larry, MD Otolaryngology [facility] Phone: [phone] Fax: [phone]    01/26/2024, 10:15 AM

## 2024-01-26 NOTE — Telephone Encounter (Signed)
 Pt stopped by and had asked if anyone could call him about the results of his myo, he understood the flow of the legs were good from the vascular testing, but didn't hear anything about the myo. Patient was also confused on scheduling with Dr Raford, but that has been resolved.

## 2024-01-26 NOTE — Patient Instructions (Signed)

## 2024-01-26 NOTE — Telephone Encounter (Signed)
 Called and spoke to pt. Full name and DOB verified.  Pt aware of normal stress test.

## 2024-02-06 DIAGNOSIS — L570 Actinic keratosis: Secondary | ICD-10-CM | POA: Diagnosis not present

## 2024-02-06 DIAGNOSIS — X32XXXD Exposure to sunlight, subsequent encounter: Secondary | ICD-10-CM | POA: Diagnosis not present

## 2024-02-06 DIAGNOSIS — L308 Other specified dermatitis: Secondary | ICD-10-CM | POA: Diagnosis not present

## 2024-02-07 ENCOUNTER — Other Ambulatory Visit: Payer: Self-pay

## 2024-02-07 MED ORDER — ESOMEPRAZOLE MAGNESIUM 40 MG PO CPDR: 40.0000 mg | DELAYED_RELEASE_CAPSULE | Freq: Two times a day (BID) | ORAL | 5 refills | Status: AC

## 2024-02-08 DIAGNOSIS — R059 Cough, unspecified: Secondary | ICD-10-CM | POA: Diagnosis not present

## 2024-02-08 DIAGNOSIS — R0981 Nasal congestion: Secondary | ICD-10-CM | POA: Diagnosis not present

## 2024-02-13 DIAGNOSIS — M50322 Other cervical disc degeneration at C5-C6 level: Secondary | ICD-10-CM | POA: Diagnosis not present

## 2024-02-13 DIAGNOSIS — M4802 Spinal stenosis, cervical region: Secondary | ICD-10-CM | POA: Diagnosis not present

## 2024-02-22 ENCOUNTER — Ambulatory Visit (INDEPENDENT_AMBULATORY_CARE_PROVIDER_SITE_OTHER): Admitting: Otolaryngology

## 2024-02-22 ENCOUNTER — Encounter (INDEPENDENT_AMBULATORY_CARE_PROVIDER_SITE_OTHER): Payer: Self-pay | Admitting: Otolaryngology

## 2024-02-22 VITALS — HR 72

## 2024-02-22 DIAGNOSIS — R490 Dysphonia: Secondary | ICD-10-CM

## 2024-02-22 DIAGNOSIS — J383 Other diseases of vocal cords: Secondary | ICD-10-CM

## 2024-02-22 MED ORDER — SULFAMETHOXAZOLE-TRIMETHOPRIM 800-160 MG PO TABS
1.0000 | ORAL_TABLET | Freq: Two times a day (BID) | ORAL | 0 refills | Status: DC
Start: 2024-02-22 — End: 2024-03-12

## 2024-02-22 MED ORDER — METHYLPREDNISOLONE 4 MG PO TBPK
ORAL_TABLET | ORAL | 1 refills | Status: DC
Start: 2024-02-22 — End: 2024-03-12

## 2024-02-22 NOTE — Progress Notes (Signed)
 Therapeutic Vocal Cord Injection CPT 68425-49 ATTENDING: Elena Larry, MD   PREOPERATIVE DIAGNOSIS(ES): 1. Bilateral Vocal Fold Atrophy 2. Hoarseness 3. Glottic insufficiency  POSTOPERATIVE DIAGNOSIS(ES): Same  PROCEDURE PERFORMED: Laryngoscopy with restylane injection into the bilateral lateral thyroarytenoid muscle(s)  INDICATIONS FOR PROCEDURE: The risks and benefits of the surgical procedure have been explained in detail to the patient and they have elected to proceed.  CONSENT:  Informed consent was obtained prior to the procedure after discussion of risks, benefits, and alternatives and expected outcomes were discussed with the patient; consent placed in chart. The possibilities of reaction to medication, pulmonary aspiration, bleeding, infection, the need for additional procedures, failure to diagnose a condition, and creating a complication requiring transfusion or operation were discussed with the patient. The patient concurred with the proposed plan, giving informed consent.    UNIVERSAL PROTOCOL/ TIMEOUT: Preprocedure verification is complete- patient verified and consents confirmed.  ANESTHESIA: local anesthesia H&P REVIEW: The patient's history and physical were reviewed today prior to procedure. All medications were reviewed and updated as well.  PROCEDURE DETAILS:  The patient was brought to the clinic and placed in a seated position.  The anterior neck skin was then cleansed with alcohol and 1% Lidocaine was used to infiltrate the skin overlying the thyrohyoid membrane. Patient had a NEB with 4% lidocaine prior the start of the procedure, to ensure good anesthesia and procedure tolerance. Afrin/Lidocaine mixture was then used to anesthetize the nasal passages. The 1.5-inch 25-gauge needle was bent with two gentle curves in same direction.  The flexible laryngoscope was then passed through the patient's nasal passageway and advanced into the larynx. The nasopharynx was free of  any lesions. The scope was passed behind the soft palate and directed toward the base of tongue. The supraglottic structures were normal in appearance. The true folds show atrophy and left vocal fold paralysis. The 23-gauge needle was passed through the petiole and 4% lidocaine was dripped on the cords while the patient phonated. It was then attached to a luer-lock syringe filled with the filler and advanced into the vocal fold, and injection was performed into the bilateral thyroarytenoid muscle(s) at a site just anterior to the vocal process. A second injection was performed at mid-fold. The augmentation carried out until some overmedialization was noted. This was then repeated on the contralateral side. The needle was then removed from the vocal fold and the airway. The scope was removed from the nasal cavity. This completed the procedure. The patient tolerated the procedure well. IMPLANTS: Restylane-L Hyaluronic Acid  ESTIMATED BLOOD LOSS: None  FINDINGS:  No evidence of a hematoma and the airway remained patent  CONDITION: Stable  COMPLICATIONS:The patient tolerated the procedure well without apparent complications and was ambulatory.  NOTES: will likely require repeat injection on the left side 2/2 severe atrophy, scoped through left side   PLAN: RTC 3 weeks for repeat videostrobe - given Rx for Medrol  pack and bactrim  (multiple needle passes, had to inject left side via infraglottic approach

## 2024-02-23 ENCOUNTER — Ambulatory Visit (INDEPENDENT_AMBULATORY_CARE_PROVIDER_SITE_OTHER): Admitting: Otolaryngology

## 2024-02-23 DIAGNOSIS — H02831 Dermatochalasis of right upper eyelid: Secondary | ICD-10-CM | POA: Diagnosis not present

## 2024-02-23 DIAGNOSIS — H25813 Combined forms of age-related cataract, bilateral: Secondary | ICD-10-CM | POA: Diagnosis not present

## 2024-02-23 DIAGNOSIS — H179 Unspecified corneal scar and opacity: Secondary | ICD-10-CM | POA: Diagnosis not present

## 2024-02-23 DIAGNOSIS — E119 Type 2 diabetes mellitus without complications: Secondary | ICD-10-CM | POA: Diagnosis not present

## 2024-02-23 LAB — HM DIABETES EYE EXAM

## 2024-03-07 ENCOUNTER — Ambulatory Visit (INDEPENDENT_AMBULATORY_CARE_PROVIDER_SITE_OTHER): Admitting: Otolaryngology

## 2024-03-07 VITALS — BP 132/85 | HR 90

## 2024-03-07 DIAGNOSIS — R490 Dysphonia: Secondary | ICD-10-CM | POA: Diagnosis not present

## 2024-03-07 DIAGNOSIS — J383 Other diseases of vocal cords: Secondary | ICD-10-CM

## 2024-03-07 NOTE — Patient Instructions (Signed)
 Botulinum Toxin (Botox) for Voice Therapy Botox is used for the treatment of abnormal muscle spasms. It has been used in voice disorders for more than 20 years. Botox works by making the muscles weak or partially paralyzing them.   Some common voice disorders treated with botox include: Spasmodic dysphonia (SD) is an involuntary contraction of the muscle that happens while speaking. This causes a strained sound to the voice or the voice to "cut out". In mild cases it will cause an unstable voice with a change in pitch. Essential tremor causes shakiness to the voice that can also affect the head and hands. Shaking in the hands and head can be treated with a pill but it usually doesn't work well for the voice.  How is Botox Given? Botox is given by an injection into the muscle. The amount of medicine used for this treatment is very small. After a Botox injection, there is little change for the first 24-72 hours. The medicine usually starts to work in 3 to 7 days. It works best in 2 to 4 weeks after the injection. It will last up to 3-4 months. Since the medication wears off over time, the injections need to be repeated every few months depending on your response to the medication and your treatment plan.   All patients are different and the treatment will be planned to treat your problem. Some patients do better when both vocal cords are injected at the same time. Other patients have better results when only one vocal cord is injected. The amount and frequency of medicine is different for all patients. The treatments are adjusted to help you get the best voice possible. You will come to the clinic for the treatment. You can eat before and after the injection. No blood work is required before the injection. Botox acts in the area it is injected only. It should not interact with any medicine.  Possible Side Effects of the Treatment Generally, this is a very safe treatment. The possible side effects  include: A breathy voice at first, which usually evens out after the first couple of weeks. In other words, your voice may get worse before it gets better. The voice may become weak and "breathy". This usually lasts 1 to 2 weeks but it can last up to 1 month. Difficulty swallowing occurs sometimes, also usually for only the first couple days or weeks. Most of the time this is very mild and does not require you to change the food you eat. It may be most noticeable with thin liquids.   Mild "cold-like" symptoms (this is rare but reported).    Botox does not work for everyone. Some patients will continue to have voice problems after the treatment. Or sometimes the voice quality doesn't improve but speaking feels easier and less difficult or strained.  What can I expect during the treatment? The injection is not very painful and can be compared to having your blood drawn, except that we use a numbing medication in addition. The injection is done using electromyography (EMG) guidance. The EMG machine measures muscle activity and will help make sure that the medication is placed in the area of the muscle with the most activity or spasm.   Most patients are sitting for the injection but some may prefer to lie down. The neck is cleaned with alcohol. An injection (Lidocaine) is given to numb the area. This may cause a light burning feeling at the injection site. You will be asked to say a  vowel (E) in order to make the muscle contract. The Botox is attached to the EMG needle. The needle is directed into the muscle just below the voice box. The needle does not have to be inserted very far (less than 1 inch depending on the thickness of the neck). Most injections take just a few minutes, often less than 60 seconds per side. You should try not to talk or swallow during the injection. This can move the needle out of place. The injection of the Botox may cause pressure or burning in the throat that may be felt in the  ear. This is because the voice box and the ear share a nerve.  What can I expect after the treatment? You should not notice much difference the day of the injection or even the next 2-3 days after. Starting a few days after the injection you should be careful how you eat, and be particularly careful when swallowing thin liquids like water or coffee. You may start to notice a breathier voice, sometimes even worse than before the injection, about 3 days after the injection. This usually lasts for 1-2 weeks. Take small bites and sips. Make sure you do not have a problem with swallowing or choking, and if you do, be sure not to talk while eating and focus fully on swallowing, especially with thin liquids like water or coffee.   Chew your food very well. If you do have any changes in swallowing they usually last about 1-2 weeks.   If you have severe choking or swallowing problems, call your doctor.  Important Things to Tell Your Doctor: If you are receiving botox for the first time, it's very important to keep good notes on the initial effects, both good and bad.  This will help your doctor determine the correct dose for you. You will be given a sheet (usually bright orange) on which you will circle a "percentage of normal function" each day for both your voice and swallow, up to 14 days after the injection.  From that point, you will circle a percentage of normal function for your voice and swallow just once a week. It's very important to bring this sheet with you to your next appointment. Consider taking a picture of it with your phone from time to time so you will have at least some of the information in case you forget to take it to the appointment. Your dose of Botox will be adjusted to get the best voice with the fewest side effects.   After the injection, you doctor will use the sheet to find out how long after the treatment you had: Swallowing problems Weak or breathy sounding voice A better  voice

## 2024-03-07 NOTE — Progress Notes (Signed)
 ENT Progress Note:   Update 03/07/2024  Discussed the use of AI scribe software for clinical note transcription with the patient, who gave verbal consent to proceed.  Henry Rivera. is a 68 year old male who presents with persistent breathy voice quality.  He experiences a persistent breathy quality to his voice that fluctuates, with some moments being better than others. This morning, his voice was almost completely gone, marking the worst it has been. Although there was a slight improvement for about three days, it was not significant. 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   Records reviewed History of Present Illness Henry Rivera. is a 68 year old male who presents with persistent breathy voice and difficulty speaking at a normal volume.  He has been experiencing a breathy voice when speaking at a normal volume, although he can speak loudly and softly without difficulty. He describes his condition as being like 'a three gear car without second gear.'  He has been undergoing speech therapy, which includes exercises such as blowing bubbles and holding notes. However, performing these exercises twice daily for a week worsened his voice, leading to a significant deterioration one Wednesday night, where he could barely speak and had to cut his message short. During therapy sessions, he can perform certain vocal tasks well, such as using a 'radio voice,' but struggles with tasks requiring a normal speaking voice.  Previous treatments with Bactrim  and steroids did not provide significant improvement. He has a history of mucus drainage for years, which he wonders might be contributing to his vocal issues.  No trouble with swallowing.    Initial Evaluation  Reason for  Consult: chronic dysphonia  choking episodes   HPI: Discussed the use of AI scribe software for clinical note transcription with the patient, who gave verbal consent to proceed.  History of Present Illness   Henry Rivera. 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   He experiences choking episodes two to three times a week, which can occur while swallowing liquids or spontaneously, such as when watching TV from swallowing his own saliva. These episodes involve coughing until the sensation eases. His wife is concerned about the potential impact on his heart, as he has a stent in place and has hx of CAD.  Over the past two to three months, he has experienced voice changes, with his voice becoming weaker and more breathy, described as almost a whisper. He has not had any recent illnesses or surgeries that could explain this change. 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  He has been diagnosed with reflux laryngitis and was informed about mucus drainage around his vocal cords after seeing Dr Luciano ENT. He has attempted dietary changes, reducing his intake of caffeine-free diet Mountain Dew from six to one or two per day, and cutting back on pizza. However, he finds it challenging to adhere to dietary restrictions recommended.  He has a history of allergies to dust mites and red oaks, which previously led to frequent bronchitis episodes, occurring three to six times a year. Allergy testing and lung x-rays were conducted, and he was advised to avoid mowing grass, which he enjoys.  He experiences  shortness of breath and leg pain/fatigue after climbing stairs, which resolves after 30 seconds. He has a history of a stent placement in the LAD due to a 90% blockage in April 2012. He is currently awaiting results from a recent nuclear stress test and plans to have a Doppler study on his  legs.  He takes baby aspirin and Nexium , the latter of which was increased to twice daily before an endoscopy a few months ago, then reduced back to once daily. The endoscopy revealed a small hiatal hernia, which has been stable for 30 years.      Records Reviewed:  Office visit with Dr Luciano ENT 09/09/23 He has been experiencing intermittent episodes of choking, reminiscent of food entering the windpipe, despite being on saliva. These episodes occur approximately once every few weeks. A few months prior, he began to notice an impact on his voice, which is significant given his roles as a Education officer, environmental and bass singer. He reports that his ability to speak loudly fluctuates. He also reports persistent drainage. He has no history of smoking or alcohol consumption. His diet includes pizza once or twice a week, occasional orange juice, and a daily intake of caffeine-free Diet Mountain Dew. He typically consumes 3 meals a day and does not snack after 10:00 PM. An upper GI endoscopy performed 2 weeks ago by Dr. Leigh revealed no abnormalities. However, the presence of food in his stomach was noted, despite fasting since 10:00 PM the previous night. Consequently, esophageal dilation was not performed. He was referred to an ENT specialist and advised to undergo a laryngoscopy. He has been on Nexium  for several years for GERD.  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 It is not getting bigger and is not causing pain   MEDICAL DECISION MAKING IMPRESSION: 1. LPRD (laryngopharyngeal reflux disease)  2. Dysphonia  3. Gastroesophageal reflux disease without esophagitis  4. Lipoma of neck  5. Hiatal hernia   RECOMMENDATIONS:  Discussed that I do not appreciate any vocal fold immobility, vocal fold lesions, or vocal fold atrophy/presbylarynges. There was copious pharyngeal and/or laryngeal mucous present on exam suggestive of irritation from possible  acid reflux/LPR. I have recommended changing his AM Nexium  to night time (30 minutes prior to dinner dosing to improve nocturnal acid control) as well as dietary and lifestyle modifications to reduce acid production. GERD/LPR Handout provided and reviewed in detail with the patient. Specifically the patient was instructed to cut back on pizza, diet [facility] and chocolate as well as eating after 6 PM. Discussed that LPR patients often do not have classic signs of GERD (heartburn), but instead may have hoarseness, throat clearing, dysphagia, increased phlegm, and globus sensation.  If no improvement in several months we will consider referral to laryngologist given professional voice complaints i.e. 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     Past Medical History:  Diagnosis Date   Allergic rhinitis    Allergy    Basal cell carcinoma 03/19/2015   L BACK, SUPERFICIAL, CX3, CAUTERY, 5FU   Basal cell carcinoma (BCC) 07/14/2021   R Upper Arm - CX3, 5FU   Basal cell carcinoma (BCC) 07/14/2021   L Forearm - CX3, 5FU   CAD (coronary artery disease) 11/2010   WITH LAD DES    Cataract    forming- small    Diabetes (HCC) 2007   Exertional dyspnea 10/06/2023  GERD (gastroesophageal reflux disease)    Hyperlipidemia    pt states uses Crestor due to Stent - not HLD    Hypertension    Leg heaviness 10/06/2023   MI (mitral incompetence)    SCCA (squamous cell carcinoma) of skin 07/14/2021   Right Hand - posterior (in situ)   Squamous cell carcinoma of skin 10/27/2009   SCC A/KA, L ELBOW, EXC   Vitamin D deficiency     Past Surgical History:  Procedure Laterality Date   COLONOSCOPY  03/27/2004   2017- #2   CORONARY ANGIOPLASTY WITH STENT PLACEMENT  11/2010   HAMMER TOE SURGERY Right    POLYPECTOMY     VASECTOMY      Family History  Problem Relation Age of Onset   Diabetes Mother    Pulmonary embolism Father     Diabetes Sister    Hepatitis Brother    Diabetes Paternal Grandfather    Heart failure Paternal Grandfather        CHF   Colon cancer Neg Hx    Colon polyps Neg Hx    Esophageal cancer Neg Hx    Rectal cancer Neg Hx    Stomach cancer Neg Hx     Social History:  reports that he has never smoked. He has never used smokeless tobacco. He reports that he does not drink alcohol and does not use drugs.  Allergies:  Allergies  Allergen Reactions   Augmentin [Amoxicillin-Pot Clavulanate]     Gi sx   Fluogen [Influenza Virus Vaccine]     Other reaction(s): Unknown   Haemophilus B Polysaccharide Vaccine Other (See Comments)    Other reaction(s): Unknown  Haemophilus influenzae type b    Medications: I have reviewed the patient's current medications.  The PMH, PSH, Medications, Allergies, and SH were reviewed and updated.  ROS: Constitutional: Negative for fever, weight loss and weight gain. Cardiovascular: Negative for chest pain and dyspnea on exertion. Respiratory: Is not experiencing shortness of breath at rest. Gastrointestinal: Negative for nausea and vomiting. Neurological: Negative for headaches. Psychiatric: The patient is not nervous/anxious  Blood pressure 132/85, pulse 90, SpO2 98%. There is no height or weight on file to calculate BMI.  PHYSICAL EXAM:  Exam: General: Well-developed, well-nourished Communication and Voice: Clear pitch and clarity Respiratory Respiratory effort: Equal inspiration and expiration without stridor Cardiovascular Peripheral Vascular: Warm extremities with equal color/perfusion Eyes: No nystagmus with equal extraocular motion bilaterally Neuro/Psych/Balance: Patient oriented to person, place, and time; Appropriate mood and affect; Gait is intact with no imbalance; Cranial nerves I-XII are intact Head and Face Inspection: Normocephalic and atraumatic without mass or lesion Palpation: Facial skeleton intact without bony stepoffs Salivary  Glands: No mass or tenderness Facial Strength: Facial motility symmetric and full bilaterally ENT Pinna: External ear intact and fully developed External canal: Canal is patent with intact skin Tympanic Membrane: Clear and mobile External Nose: No scar or anatomic deformity Internal Nose: Septum is deviated to the left. No polyp, or purulence. Mucosal edema and erythema present.  Bilateral inferior turbinate hypertrophy.  Lips, Teeth, and gums: Mucosa and teeth intact and viable TMJ: No pain to palpation with full mobility Oral cavity/oropharynx: No erythema or exudate, no lesions present Nasopharynx: No mass or lesion with intact mucosa Hypopharynx: Intact mucosa without pooling of secretions Larynx Glottic: Full true vocal cord mobility without lesion or mass VF appear augmented but left side is augmented partially only, with persistent glottic gap Supraglottic: Normal appearing epiglottis and AE folds Interarytenoid Space:  Moderate pachydermia&edema Subglottic Space: Patent without lesion or edema Neck Neck and Trachea: Midline trachea without mass or lesion Thyroid : No mass or nodularity Lymphatics: No lymphadenopathy  Procedure:  Preoperative diagnosis: dysphonia  Postoperative diagnosis:   Same, VF atrophy and glottic insufficiency, s/p injection augmentation  Phonatory tasks: more difficulties with AbSD sentences   Procedure: Flexible fiberoptic laryngoscopy with videostroboscopy  Surgeon: Kris Burd, MD  Anesthesia: Topical lidocaine and Afrin Complications: None Condition is stable throughout exam  Indications and consent:  The patient presents to the clinic with above symptoms. Indirect laryngoscopy view was incomplete. Thus it was recommended that they undergo a flexible fiberoptic laryngoscopy. All of the risks, benefits, and potential complications were reviewed with the patient preoperatively and verbal informed consent was obtained.  Procedure: The  patient was seated upright in the clinic. Topical lidocaine and Afrin were applied to the nasal cavity. After adequate anesthesia had occurred, I then proceeded to pass the flexible telescope into the nasal cavity. The nasal cavity was patent without rhinorrhea or polyp. The nasopharynx was also patent without mass or lesion. The base of tongue was visualized and was normal. There were no signs of pooling of secretions in the piriform sinuses. The true vocal folds were mobile bilaterally. There were no signs of glottic or supraglottic mucosal lesion or mass. There was moderate interarytenoid pachydermia and post cricoid edema. The telescope was then slowly withdrawn and the patient tolerated the procedure throughout.  The true vocal cords are mobile. The medial edges were straight on the right and bowed on the left. Closure was incomplete. Periodicity present. The mucosal wave and amplitude were intact and normal. There is moderate interarytenoid pachydermia and post cricoid edema. The mucosa appears without lesions.   The laryngoscope was then slowly withdrawn and the patient tolerated the procedure well. There were no complications or blood loss.   Studies Reviewed: EGD IMPRESSION 09/02/2023 Dr. Arlene  Impression: - Esophagogastric landmarks identified. - 1 cm hiatal hernia. - Normal esophagus otherwise - no stenosis /  stricture, no Barrett's - I was going to perform  empiric dilation of his esophagus but did not do so  given aspiration risk for residual food in the  stomach. - A medium amount of food (residue) in the stomach. - Normal stomach otherwise. - Normal examined duodenum.   CXR 06/02/2022 FINDINGS: The heart size and mediastinal contours are within normal limits. Both lungs are clear. The visualized skeletal structures are unremarkable.   IMPRESSION: No active disease.   Assessment/Plan: Encounter Diagnoses  Name Primary?   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  Videostrobe today: The true vocal cords are mobile. The medial edges were bowed and there was evidence of VF atrophy b/l.Closure was incomplete and there was evidence of supraglottic compression with high-pitch phonation. Periodicity present. There was scant mucus throughout hypopharynx. The mucosal wave and amplitude were intact and symmetric/normal in implitude. There is moderate interarytenoid pachydermia and post cricoid edema.  We discussed that his voice changes are likely multifactorial but one of the contributing factors is vocal fold atrophy noted on exam today. Likely age-related, as it commonly begins in the fifties and sixties. Reports a change in  voice over the past two to three months, with frequent voice use as a Education officer, environmental and singer. No evidence of phonotrauma, such as bruising or thickening of the vocal cords. Discussed treatment options including voice therapy, filler injection, or medialization thyroplasty surgery. Voice therapy is the initial approach to optimize breath support and improve voice quality. Further interventions considered if voice therapy alone is insufficient.  Also had evidence of thick mucus in hypopharynx and this could be a sign of significant postnasal drainage versus chronic laryngitis.  There were also findings of GERD LPR.  Will medically address both. - Refer to speech therapy for voice therapy to optimize breath support and improve voice quality. - Continue regular voice use as there is no evidence of phonotrauma. - Consider other interventions if no improvement with speech therapy -Medical management of chronic laryngitis as below -Medical management of postnasal drainage  and GERD LPR as below  Chronic laryngitis Exam with scant thick mucus in hypopharynx and along laryngeal structures etiology unclear, but may be related to postnasal drainage or reflux irritation, versus laryngitis sicca syndrome, versus chronic laryngitis. Has reflux and environmental allergies to dust mites and red oaks. Treatment includes a combination of antibiotic, steroid, and antifungal for chronic laryngitis. Discussed the potential for short-term blood sugar elevation due to steroid use, especially given his diabetes. - Prescribe Bactrim , Diflucan , and Medrol  Pak to address potential bacterial, fungal, or inflammatory causes of laryngitis. - nasal saline rinses to reduce nasal mucus and postnasal drip. - Continue Allegra daily and Flonase  twice daily for allergy management.  Chronic nasal congestion and post-nasal drainage Evidence of post-nasal drainage/nasal crusting during scope exam today, could be contributing to her sx -Continue Allegra daily and start Flonase  2 puffs b/l nares BID - consider nasal saline rinses   GERD LPR Reflux irritation may contribute to laryngospasm (choking episodes could be related to laryngospasm ). Made some dietary changes but continues to experience symptoms. Currently taking Nexium . Discussed the use of a Reflux Gourmet to be taken after meals to prevent reflux. Provided dietary guidance and resources for managing reflux, including a website with reflux-friendly recipes. - Continue Nexium  as prescribed. -  Reflux Gourmet after meals - diet and lifestyle changes to minimize GERD - Refer to [employer] blog for dietary and lifestyle modifications/reflux cook book.  Laryngospasm Experiences choking episodes two to three times a week, which may be related to laryngospasm. Could be due to reflux or vocal cord atrophy, as incomplete vocal cord closure can trigger a cough reflex when mucus or other irritants contact the vocal cords. Addressing underlying  reflux and mucus issues is essential. - Address underlying reflux and mucus issues as above  Follow-up Requires follow-up to assess the effectiveness of the treatment plan and determine if further interventions are necessary. - Schedule follow-up appointment in a couple of months after speech therapy  Update 03/07/2024 Assessment and Plan Assessment & Plan Dysphonia  Vocal cord atrophy with breathy voice. Previous treatments ineffective. Scope exam shows VF atrophy and glottic gap but no supraglottic compression. No evidence of AdSD or AbSD with phonatory tasks and sentences, but he does have intermittent breathy quality when talking and it is somewhat inconsistent.  - we discussed Restylane Injection augmentation risks and benefits and he would like to proceed - Provided detailed procedure expectations. - Advised him to bring a ride for the procedure. - Consider Botox injection if filler injection does not improve symptoms, low dose targeting PCA for suspected Abductor spasms although his evaluation  is not c/w it.  - Discussed potential for permanent implant if filler injection is effective but voice declines in 9-12 months.   Update 03/07/24 VF atrophy and glottic insufficiency, Dysphonia Reports persistent breathy voice, strobe with evidence of only partial augmentation on the left side. Persistent incomplete glottic closure, primarily left-sided. Phonatory tasks c/w AbSD. We will do a trial of repeat augmentation on the left side, and if no improvement will do Botox to PCA.  - Schedule for repeat injection augmentation left side. - Consider Botox injection if symptoms persist two weeks post-filler.  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 - Consider Botox injection into abductor muscle if filler injection does not resolve symptoms. - Monitor response to filler  injection and plan Botox injection two weeks post-filler if needed.   Elena Larry, MD Otolaryngology [facility] Phone: [phone] Fax: [phone]    03/07/2024, 4:07 PM

## 2024-03-12 ENCOUNTER — Ambulatory Visit (INDEPENDENT_AMBULATORY_CARE_PROVIDER_SITE_OTHER): Admitting: Otolaryngology

## 2024-03-12 ENCOUNTER — Encounter (INDEPENDENT_AMBULATORY_CARE_PROVIDER_SITE_OTHER): Payer: Self-pay | Admitting: Otolaryngology

## 2024-03-12 DIAGNOSIS — J383 Other diseases of vocal cords: Secondary | ICD-10-CM

## 2024-03-12 DIAGNOSIS — J385 Laryngeal spasm: Secondary | ICD-10-CM

## 2024-03-12 DIAGNOSIS — R490 Dysphonia: Secondary | ICD-10-CM

## 2024-03-12 MED ORDER — SULFAMETHOXAZOLE-TRIMETHOPRIM 800-160 MG PO TABS
1.0000 | ORAL_TABLET | Freq: Two times a day (BID) | ORAL | 0 refills | Status: AC
Start: 2024-03-12 — End: ?

## 2024-03-12 MED ORDER — METHYLPREDNISOLONE 4 MG PO TBPK
ORAL_TABLET | ORAL | 1 refills | Status: AC
Start: 2024-03-12 — End: ?

## 2024-03-12 NOTE — Progress Notes (Signed)
 Therapeutic Vocal Cord Injection CPT 31574  ATTENDING: Elena Larry, MD   PREOPERATIVE DIAGNOSIS(ES): 1. Bilateral Vocal Fold Atrophy 2. Hoarseness 3. Glottic insufficiency  POSTOPERATIVE DIAGNOSIS(ES): Same  PROCEDURE PERFORMED: Laryngoscopy with restylane injection into the left lateral thyroarytenoid muscle(s)  INDICATIONS FOR PROCEDURE: The risks and benefits of the surgical procedure have been explained in detail to the patient and they have elected to proceed.  CONSENT:  Informed consent was obtained prior to the procedure after discussion of risks, benefits, and alternatives and expected outcomes were discussed with the patient; consent placed in chart. The possibilities of reaction to medication, pulmonary aspiration, bleeding, infection, the need for additional procedures, failure to diagnose a condition, and creating a complication requiring transfusion or operation were discussed with the patient. The patient concurred with the proposed plan, giving informed consent.    UNIVERSAL PROTOCOL/ TIMEOUT: Preprocedure verification is complete- patient verified and consents confirmed.  ANESTHESIA: local anesthesia H&P REVIEW: The patient's history and physical were reviewed today prior to procedure. All medications were reviewed and updated as well.  PROCEDURE DETAILS:  The patient was brought to the clinic and placed in a seated position.  The anterior neck skin was then cleansed with alcohol and 1% Lidocaine was used to infiltrate the skin overlying the thyrohyoid membrane. Patient had a NEB with 4% lidocaine prior the start of the procedure, to ensure good anesthesia and procedure tolerance. Afrin/Lidocaine mixture was then used to anesthetize the nasal passages. The 1.5-inch 25-gauge needle was bent with two gentle curves in same direction.  The flexible laryngoscope was then passed through the patient's nasal passageway and advanced into the larynx. The nasopharynx was free of any  lesions. The scope was passed behind the soft palate and directed toward the base of tongue. The supraglottic structures were normal in appearance. The true folds show atrophy and left vocal fold paralysis. The 23-gauge needle was passed through the petiole and 4% lidocaine was dripped on the cords while the patient phonated. It was then attached to a luer-lock syringe filled with the filler and advanced into the vocal fold, and injection was performed into the bilateral thyroarytenoid muscle(s) at a site just anterior to the vocal process. A second injection was performed at mid-fold. The augmentation carried out until some overmedialization was noted. The needle was then removed from the vocal fold and the airway. The scope was removed from the nasal cavity. This completed the procedure. The patient tolerated the procedure well. IMPLANTS: Restylane-L Hyaluronic Acid  ESTIMATED BLOOD LOSS: None  FINDINGS:  No evidence of a hematoma and the airway remained patent  CONDITION: Stable  COMPLICATIONS:The patient tolerated the procedure well without apparent complications and was ambulatory.  NOTES: will likely require repeat injection on the left side 2/2 severe atrophy, scoped through left side   PLAN: RTC 3 weeks for repeat videostrobe - given Rx for Medrol  pack and bactrim  (multiple needle passes, had to inject left side via infraglottic approach

## 2024-03-22 ENCOUNTER — Ambulatory Visit (INDEPENDENT_AMBULATORY_CARE_PROVIDER_SITE_OTHER): Admitting: Otolaryngology

## 2024-04-06 DIAGNOSIS — R0989 Other specified symptoms and signs involving the circulatory and respiratory systems: Secondary | ICD-10-CM | POA: Diagnosis not present

## 2024-04-06 DIAGNOSIS — R0781 Pleurodynia: Secondary | ICD-10-CM | POA: Diagnosis not present

## 2024-04-06 DIAGNOSIS — R1011 Right upper quadrant pain: Secondary | ICD-10-CM | POA: Diagnosis not present

## 2024-04-12 ENCOUNTER — Ambulatory Visit (INDEPENDENT_AMBULATORY_CARE_PROVIDER_SITE_OTHER): Admitting: Otolaryngology

## 2024-04-12 ENCOUNTER — Encounter (INDEPENDENT_AMBULATORY_CARE_PROVIDER_SITE_OTHER): Payer: Self-pay | Admitting: Otolaryngology

## 2024-04-12 VITALS — BP 135/71 | HR 74

## 2024-04-12 DIAGNOSIS — R0981 Nasal congestion: Secondary | ICD-10-CM

## 2024-04-12 DIAGNOSIS — K219 Gastro-esophageal reflux disease without esophagitis: Secondary | ICD-10-CM

## 2024-04-12 DIAGNOSIS — J3089 Other allergic rhinitis: Secondary | ICD-10-CM

## 2024-04-12 DIAGNOSIS — R490 Dysphonia: Secondary | ICD-10-CM

## 2024-04-12 DIAGNOSIS — J385 Laryngeal spasm: Secondary | ICD-10-CM | POA: Diagnosis not present

## 2024-04-12 DIAGNOSIS — R0982 Postnasal drip: Secondary | ICD-10-CM

## 2024-04-12 DIAGNOSIS — J383 Other diseases of vocal cords: Secondary | ICD-10-CM | POA: Diagnosis not present

## 2024-04-12 NOTE — Progress Notes (Signed)
 ENT Progress Note:   Update 04/12/2024  Discussed the use of AI scribe software for clinical note transcription with the patient, who gave verbal consent to proceed.  Henry WELKER. 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 His voice sounds normal from the pulpit and is audible to those with hearing difficulties, but he misses his singing voice, particularly his bass range.  Last OV  Henry GERMOND. is a 68 year old male who presents with persistent breathy voice quality.  He experiences a persistent breathy quality to his voice that fluctuates, with some moments being better than others. This morning, his voice was almost completely gone, marking the worst it has been. Although there was a slight improvement for about three days, it was not significant. 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   Records reviewed History of Present Illness Henry CHEATWOOD. is a 68 year old male who presents with persistent breathy voice and difficulty speaking at a normal volume.  He has been experiencing a breathy voice when speaking at a normal volume, although he can speak loudly and softly without difficulty. He describes his condition as being like 'a three gear car without second gear.'  He has been undergoing speech therapy, which includes exercises such as blowing bubbles and holding notes. However, performing these exercises twice daily for a week worsened his voice, leading to a significant deterioration one Wednesday night, where he could barely speak  and had to cut his message short. During therapy sessions, he can perform certain vocal tasks well, such as using a 'radio voice,' but struggles with tasks requiring a normal speaking voice.  Previous treatments with Bactrim  and steroids did not provide significant improvement. He has a history of mucus drainage for years, which he wonders might be contributing to his vocal issues.  No trouble with swallowing.   Initial Evaluation  Reason for Consult: chronic dysphonia  choking episodes   HPI: Discussed the use of AI scribe software for clinical note transcription with the patient, who gave verbal consent to proceed.  History of Present Illness   Henry Rivera. 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   He experiences choking episodes two to three times a week, which can occur while swallowing liquids or spontaneously, such as when watching TV from swallowing his own saliva. These episodes involve coughing until the sensation eases. His wife is concerned about the potential impact on his heart, as he has a stent in place and has hx of CAD.  Over the past two to three months, he has experienced voice changes, with his voice becoming weaker and more breathy, described as almost a whisper. He has not had any recent illnesses or surgeries that could explain this change. 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  He has been diagnosed with reflux laryngitis and was informed about mucus drainage around his vocal cords after seeing Dr Luciano ENT. He has attempted dietary changes, reducing his intake  of caffeine-free diet Mountain Dew from six to one or two per day, and cutting back on pizza. However, he finds it challenging to adhere to dietary restrictions recommended.  He has a history of allergies to dust mites and red oaks, which previously led to frequent bronchitis episodes,  occurring three to six times a year. Allergy testing and lung x-rays were conducted, and he was advised to avoid mowing grass, which he enjoys.  He experiences shortness of breath and leg pain/fatigue after climbing stairs, which resolves after 30 seconds. He has a history of a stent placement in the LAD due to a 90% blockage in April 2012. He is currently awaiting results from a recent nuclear stress test and plans to have a Doppler study on his legs.  He takes baby aspirin and Nexium , the latter of which was increased to twice daily before an endoscopy a few months ago, then reduced back to once daily. The endoscopy revealed a small hiatal hernia, which has been stable for 30 years.      Records Reviewed:  Office visit with Dr Luciano ENT 09/09/23 He has been experiencing intermittent episodes of choking, reminiscent of food entering the windpipe, despite being on saliva. These episodes occur approximately once every few weeks. A few months prior, he began to notice an impact on his voice, which is significant given his roles as a Education officer, environmental and bass singer. He reports that his ability to speak loudly fluctuates. He also reports persistent drainage. He has no history of smoking or alcohol consumption. His diet includes pizza once or twice a week, occasional orange juice, and a daily intake of caffeine-free Diet Mountain Dew. He typically consumes 3 meals a day and does not snack after 10:00 PM. An upper GI endoscopy performed 2 weeks ago by Dr. Leigh revealed no abnormalities. However, the presence of food in his stomach was noted, despite fasting since 10:00 PM the previous night. Consequently, esophageal dilation was not performed. He was referred to an ENT specialist and advised to undergo a laryngoscopy. He has been on Nexium  for several years for GERD.  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 It is not getting bigger and is not causing  pain   MEDICAL DECISION MAKING IMPRESSION: 1. LPRD (laryngopharyngeal reflux disease)  2. Dysphonia  3. Gastroesophageal reflux disease without esophagitis  4. Lipoma of neck  5. Hiatal hernia   RECOMMENDATIONS:  Discussed that I do not appreciate any vocal fold immobility, vocal fold lesions, or vocal fold atrophy/presbylarynges. There was copious pharyngeal and/or laryngeal mucous present on exam suggestive of irritation from possible acid reflux/LPR. I have recommended changing his AM Nexium  to night time (30 minutes prior to dinner dosing to improve nocturnal acid control) as well as dietary and lifestyle modifications to reduce acid production. GERD/LPR Handout provided and reviewed in detail with the patient. Specifically the patient was instructed to cut back on pizza, diet [facility] and chocolate as well as eating after 6 PM. Discussed that LPR patients often do not have classic signs of GERD (heartburn), but instead may have hoarseness, throat clearing, dysphagia, increased phlegm, and globus sensation.  If no improvement in several months we will consider referral to laryngologist given professional voice complaints i.e. 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     Past Medical History:  Diagnosis Date   Allergic rhinitis  Allergy    Basal cell carcinoma 03/19/2015   L BACK, SUPERFICIAL, CX3, CAUTERY, 5FU   Basal cell carcinoma (BCC) 07/14/2021   R Upper Arm - CX3, 5FU   Basal cell carcinoma (BCC) 07/14/2021   L Forearm - CX3, 5FU   CAD (coronary artery disease) 11/2010   WITH LAD DES    Cataract    forming- small    Diabetes (HCC) 2007   Exertional dyspnea 10/06/2023   GERD (gastroesophageal reflux disease)    Hyperlipidemia    pt states uses Crestor due to Stent - not HLD    Hypertension    Leg heaviness 10/06/2023   MI (mitral incompetence)    SCCA (squamous cell carcinoma) of skin 07/14/2021    Right Hand - posterior (in situ)   Squamous cell carcinoma of skin 10/27/2009   SCC A/KA, L ELBOW, EXC   Vitamin D deficiency     Past Surgical History:  Procedure Laterality Date   COLONOSCOPY  03/27/2004   2017- #2   CORONARY ANGIOPLASTY WITH STENT PLACEMENT  11/2010   HAMMER TOE SURGERY Right    POLYPECTOMY     VASECTOMY      Family History  Problem Relation Age of Onset   Diabetes Mother    Pulmonary embolism Father    Diabetes Sister    Hepatitis Brother    Diabetes Paternal Grandfather    Heart failure Paternal Grandfather        CHF   Colon cancer Neg Hx    Colon polyps Neg Hx    Esophageal cancer Neg Hx    Rectal cancer Neg Hx    Stomach cancer Neg Hx     Social History:  reports that he has never smoked. He has never used smokeless tobacco. He reports that he does not drink alcohol and does not use drugs.  Allergies:  Allergies  Allergen Reactions   Augmentin [Amoxicillin-Pot Clavulanate]     Gi sx   Fluogen [Influenza Virus Vaccine]     Other reaction(s): Unknown   Haemophilus B Polysaccharide Vaccine Other (See Comments)    Other reaction(s): Unknown  Haemophilus influenzae type b    Medications: I have reviewed the patient's current medications.  The PMH, PSH, Medications, Allergies, and SH were reviewed and updated.  ROS: Constitutional: Negative for fever, weight loss and weight gain. Cardiovascular: Negative for chest pain and dyspnea on exertion. Respiratory: Is not experiencing shortness of breath at rest. Gastrointestinal: Negative for nausea and vomiting. Neurological: Negative for headaches. Psychiatric: The patient is not nervous/anxious  Blood pressure 135/71, pulse 74, SpO2 96%. There is no height or weight on file to calculate BMI.  PHYSICAL EXAM:  Exam: General: Well-developed, well-nourished Communication and Voice: Clear pitch and clarity Respiratory Respiratory effort: Equal inspiration and expiration without  stridor Cardiovascular Peripheral Vascular: Warm extremities with equal color/perfusion Eyes: No nystagmus with equal extraocular motion bilaterally Neuro/Psych/Balance: Patient oriented to person, place, and time; Appropriate mood and affect; Gait is intact with no imbalance; Cranial nerves I-XII are intact Head and Face Inspection: Normocephalic and atraumatic without mass or lesion Palpation: Facial skeleton intact without bony stepoffs Salivary Glands: No mass or tenderness Facial Strength: Facial motility symmetric and full bilaterally ENT Pinna: External ear intact and fully developed External canal: Canal is patent with intact skin Tympanic Membrane: Clear and mobile External Nose: No scar or anatomic deformity Internal Nose: Septum is deviated to the left. No polyp, or purulence. Mucosal edema and erythema present.  Bilateral inferior  turbinate hypertrophy.  Lips, Teeth, and gums: Mucosa and teeth intact and viable TMJ: No pain to palpation with full mobility Oral cavity/oropharynx: No erythema or exudate, no lesions present Nasopharynx: No mass or lesion with intact mucosa Hypopharynx: Intact mucosa without pooling of secretions Larynx Glottic: Full true vocal cord mobility without lesion or mass VFs appear augmented with complete glottic closure Supraglottic: Normal appearing epiglottis and AE folds Interarytenoid Space: Moderate pachydermia&edema Subglottic Space: Patent without lesion or edema Neck Neck and Trachea: Midline trachea without mass or lesion Thyroid : No mass or nodularity Lymphatics: No lymphadenopathy  Procedure:  Preoperative diagnosis: dysphonia  Postoperative diagnosis:   Same, injection augmentation  Phonatory tasks: more difficulties with AbSD sentences   Procedure: Flexible fiberoptic laryngoscopy with videostroboscopy  Surgeon: Chidera Thivierge, MD  Anesthesia: Topical lidocaine and Afrin Complications: None Condition is stable throughout  exam  Indications and consent:  The patient presents to the clinic with above symptoms. Indirect laryngoscopy view was incomplete. Thus it was recommended that they undergo a flexible fiberoptic laryngoscopy. All of the risks, benefits, and potential complications were reviewed with the patient preoperatively and verbal informed consent was obtained.  Procedure: The patient was seated upright in the clinic. Topical lidocaine and Afrin were applied to the nasal cavity. After adequate anesthesia had occurred, I then proceeded to pass the flexible telescope into the nasal cavity. The nasal cavity was patent without rhinorrhea or polyp. The nasopharynx was also patent without mass or lesion. The base of tongue was visualized and was normal. There were no signs of pooling of secretions in the piriform sinuses. The true vocal folds were mobile bilaterally. There were no signs of glottic or supraglottic mucosal lesion or mass. There was moderate interarytenoid pachydermia and post cricoid edema. The telescope was then slowly withdrawn and the patient tolerated the procedure throughout.  The true vocal cords are mobile. The medial edges were straight. Closure was  complete. Periodicity present. The mucosal wave and amplitude were intact and normal. There is moderate interarytenoid pachydermia and post cricoid edema. The mucosa appears without lesions.   The laryngoscope was then slowly withdrawn and the patient tolerated the procedure well. There were no complications or blood loss.   Studies Reviewed: EGD IMPRESSION 2023/09/04 Dr. Arlene  Impression: - Esophagogastric landmarks identified. - 1 cm hiatal hernia. - Normal esophagus otherwise - no stenosis /  stricture, no Barrett's - I was going to perform  empiric dilation of his esophagus but did not do so  given aspiration risk for residual food in the  stomach. - A medium amount of food (residue) in the stomach. - Normal stomach otherwise. - Normal  examined duodenum.   CXR 06/02/2022 FINDINGS: The heart size and mediastinal contours are within normal limits. Both lungs are clear. The visualized skeletal structures are unremarkable.   IMPRESSION: No active disease.  Assessment/Plan: Encounter Diagnoses  Name Primary?   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  Videostrobe today: The true vocal cords are mobile. The medial edges were bowed and there was evidence of VF atrophy b/l.Closure was incomplete and there  was evidence of supraglottic compression with high-pitch phonation. Periodicity present. There was scant mucus throughout hypopharynx. The mucosal wave and amplitude were intact and symmetric/normal in implitude. There is moderate interarytenoid pachydermia and post cricoid edema.  We discussed that his voice changes are likely multifactorial but one of the contributing factors is vocal fold atrophy noted on exam today. Likely age-related, as it commonly begins in the fifties and sixties. Reports a change in voice over the past two to three months, with frequent voice use as a Education officer, environmental and singer. No evidence of phonotrauma, such as bruising or thickening of the vocal cords. Discussed treatment options including voice therapy, filler injection, or medialization thyroplasty surgery. Voice therapy is the initial approach to optimize breath support and improve voice quality. Further interventions considered if voice therapy alone is insufficient.  Also had evidence of thick mucus in hypopharynx and this could be a sign of significant postnasal drainage versus chronic laryngitis.   There were also findings of GERD LPR.  Will medically address both. - Refer to speech therapy for voice therapy to optimize breath support and improve voice quality. - Continue regular voice use as there is no evidence of phonotrauma. - Consider other interventions if no improvement with speech therapy -Medical management of chronic laryngitis as below -Medical management of postnasal drainage and GERD LPR as below  Chronic laryngitis Exam with scant thick mucus in hypopharynx and along laryngeal structures etiology unclear, but may be related to postnasal drainage or reflux irritation, versus laryngitis sicca syndrome, versus chronic laryngitis. Has reflux and environmental allergies to dust mites and red oaks. Treatment includes a combination of antibiotic, steroid, and antifungal for chronic laryngitis. Discussed the potential for short-term blood sugar elevation due to steroid use, especially given his diabetes. - Prescribe Bactrim , Diflucan , and Medrol  Pak to address potential bacterial, fungal, or inflammatory causes of laryngitis. - nasal saline rinses to reduce nasal mucus and postnasal drip. - Continue Allegra daily and Flonase  twice daily for allergy management.  Chronic nasal congestion and post-nasal drainage Evidence of post-nasal drainage/nasal crusting during scope exam today, could be contributing to her sx -Continue Allegra daily and start Flonase  2 puffs b/l nares BID - consider nasal saline rinses   GERD LPR Reflux irritation may contribute to laryngospasm (choking episodes could be related to laryngospasm ). Made some dietary changes but continues to experience symptoms. Currently taking Nexium . Discussed the use of a Reflux Gourmet to be taken after meals to prevent reflux. Provided dietary guidance and resources for managing reflux, including a website with reflux-friendly recipes. - Continue Nexium  as prescribed. -  Reflux Gourmet after meals - diet and lifestyle changes  to minimize GERD - Refer to [employer] blog for dietary and lifestyle modifications/reflux cook book.  Laryngospasm Experiences choking episodes two to three times a week, which may be related to laryngospasm. Could be due to reflux or vocal cord atrophy, as incomplete vocal cord closure can trigger a cough reflex when mucus or other irritants contact the vocal cords. Addressing underlying reflux and mucus issues is essential. - Address underlying reflux and mucus issues as above  Follow-up Requires follow-up to assess the effectiveness of the treatment plan and determine if further interventions are necessary. - Schedule follow-up appointment in a couple of months after speech therapy  Update Last OV Assessment and Plan Assessment & Plan Update 03/07/24 VF atrophy and glottic insufficiency, Dysphonia Reports persistent breathy voice, strobe with evidence of only partial augmentation on the left side. Persistent incomplete  glottic closure, primarily left-sided. Phonatory tasks c/w AbSD. We will do a trial of repeat augmentation on the left side, and if no improvement will do Botox to PCA.  - Schedule for repeat injection augmentation left side. - Consider Botox injection if symptoms persist two weeks post-filler.  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 - will start with low dose aiming to inject PCA  Postnasal drip with reflux irritation Chronic postnasal drip with reflux irritation causing phlegm and throat irritation. - Continue current allergy medications - continue current medications for GERD    Elena Larry, MD Otolaryngology [facility] Phone: [phone] Fax: [phone]    04/12/2024, 2:58 PM

## 2024-04-19 DIAGNOSIS — M5414 Radiculopathy, thoracic region: Secondary | ICD-10-CM | POA: Diagnosis not present

## 2024-04-24 DIAGNOSIS — M5414 Radiculopathy, thoracic region: Secondary | ICD-10-CM | POA: Diagnosis not present

## 2024-04-30 ENCOUNTER — Encounter (INDEPENDENT_AMBULATORY_CARE_PROVIDER_SITE_OTHER): Payer: Self-pay | Admitting: Otolaryngology

## 2024-04-30 ENCOUNTER — Ambulatory Visit (INDEPENDENT_AMBULATORY_CARE_PROVIDER_SITE_OTHER): Admitting: Otolaryngology

## 2024-04-30 VITALS — BP 149/66 | HR 75

## 2024-04-30 DIAGNOSIS — J385 Laryngeal spasm: Secondary | ICD-10-CM | POA: Diagnosis not present

## 2024-04-30 NOTE — Progress Notes (Signed)
 PROCEDURE  NDC 9976-8854-98  Vocal Cord Injection With Botox (35382-49)  ATTENDING: Elena Larry, M.D.  PREOPERATIVE DIAGNOSIS(ES): Laryngeal spasm  POSTOPERATIVE DIAGNOSIS(ES): Same  PROCEDURE PERFORMED: Botox injection into the left and right posterior cricoarytenoid muscle utilizing a transcutaneous EMG-guided approach  INDICATIONS FOR PROCEDURE: The patient presents for scheduled Botox injection. The risks and benefits of the surgical procedure have been explained in detail to the patient and he has elected to proceed.  CONSENT: Informed consent was obtained prior to the procedure after discussion of risks, benefits, and alternatives and expected outcomes were discussed with the patient; consent placed in chart. The possibilities of reaction to medication, pulmonary aspiration, bleeding, infection, the need for additional procedures, failure to diagnose a condition, dyspnea, and creating a complication requiring transfusion or operation were discussed with the patient. The patient concurred with the proposed plan, giving informed consent.  UNIVERSAL PROTOCOL/ TIMEOUT: Preprocedure verification is complete patient verified and consents confirmed, procedure sites are identified and marked, timeout was called before the start of the procedure.  H&P REVIEW: The patient's history and physical were reviewed today prior to procedure. All medications were reviewed and updated as well.  ANESTHESIA: local anesthesia   PROCEDURE DETAILS: The patient was in a seated position. The anterior neck skin was then cleansed with alcohol and 1% Lidocaine was used to infiltrate the skin over the cricothyroid membrane. A hollow monopolar EMG needle then passed through the cricothyroid membrane and angled laterally 15 degrees until the cricoid ring was encountered and punctured. EMG confirmation was achieved by having the patient sniff. The injection was then performed with placement of 2.5 units on each  side. This was repeated on the opposite side. A total of 5 was injected. Once this was completed, that marked the end of the surgical procedure. 95 units were discarded. A total of 100 units was used.  IMPLANTS: None  CONDITION: Stable  COMPLICATIONS:The patient tolerated the procedure well without apparent complications and was ambulatory.  PLAN: call to report voice changes in 2 weeks, f/u in 3 months to discuss next injection

## 2024-05-11 DIAGNOSIS — E785 Hyperlipidemia, unspecified: Secondary | ICD-10-CM | POA: Diagnosis not present

## 2024-05-11 DIAGNOSIS — N1831 Chronic kidney disease, stage 3a: Secondary | ICD-10-CM | POA: Diagnosis not present

## 2024-05-11 DIAGNOSIS — E1122 Type 2 diabetes mellitus with diabetic chronic kidney disease: Secondary | ICD-10-CM | POA: Diagnosis not present

## 2024-05-18 ENCOUNTER — Telehealth (HOSPITAL_BASED_OUTPATIENT_CLINIC_OR_DEPARTMENT_OTHER): Payer: Self-pay | Admitting: Cardiovascular Disease

## 2024-05-18 ENCOUNTER — Telehealth (INDEPENDENT_AMBULATORY_CARE_PROVIDER_SITE_OTHER): Payer: Self-pay

## 2024-05-18 DIAGNOSIS — K219 Gastro-esophageal reflux disease without esophagitis: Secondary | ICD-10-CM | POA: Diagnosis not present

## 2024-05-18 DIAGNOSIS — E559 Vitamin D deficiency, unspecified: Secondary | ICD-10-CM | POA: Diagnosis not present

## 2024-05-18 DIAGNOSIS — Z955 Presence of coronary angioplasty implant and graft: Secondary | ICD-10-CM | POA: Diagnosis not present

## 2024-05-18 DIAGNOSIS — I129 Hypertensive chronic kidney disease with stage 1 through stage 4 chronic kidney disease, or unspecified chronic kidney disease: Secondary | ICD-10-CM | POA: Diagnosis not present

## 2024-05-18 DIAGNOSIS — E1122 Type 2 diabetes mellitus with diabetic chronic kidney disease: Secondary | ICD-10-CM | POA: Diagnosis not present

## 2024-05-18 DIAGNOSIS — E785 Hyperlipidemia, unspecified: Secondary | ICD-10-CM | POA: Diagnosis not present

## 2024-05-18 DIAGNOSIS — N1831 Chronic kidney disease, stage 3a: Secondary | ICD-10-CM | POA: Diagnosis not present

## 2024-05-18 DIAGNOSIS — R490 Dysphonia: Secondary | ICD-10-CM

## 2024-05-18 DIAGNOSIS — Z7982 Long term (current) use of aspirin: Secondary | ICD-10-CM | POA: Diagnosis not present

## 2024-05-18 DIAGNOSIS — I499 Cardiac arrhythmia, unspecified: Secondary | ICD-10-CM | POA: Diagnosis not present

## 2024-05-18 NOTE — Telephone Encounter (Signed)
 Spoke with patient who stated he went to see PCP for routine follow up and when she was listening to patients heart sounded abnormal  EKG obtained  Patient felt and feels nothing   Dr Raford reviewed EKG and having PAC's   Advised patient, verbalized understanding Call back if any issues

## 2024-05-18 NOTE — Telephone Encounter (Signed)
 Pt dropped off an EKG copy. And was told by the physician that he was very close to AFIB. And to bring the copy to Dr Raford to look at. Will be in providers box. Please call patient and advise if necessary.

## 2024-05-18 NOTE — Telephone Encounter (Signed)
 The patient called in to report that his voice has improved since his botox inj.  He says that Dr Soldatova did a small dose this time to see how he responded.  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  He would like to try a higher dose of botox next and he wants to know when he can do that.  I let him know that Dr. Soldatova is no longer in our office and that you would look into his case and let him know a plan of care for him.

## 2024-05-21 NOTE — Telephone Encounter (Signed)
 I'll send one. Please let him know that Dr. Brien also has a lot of experience with this procedure.

## 2024-05-25 ENCOUNTER — Encounter (INDEPENDENT_AMBULATORY_CARE_PROVIDER_SITE_OTHER): Payer: Self-pay

## 2024-08-06 ENCOUNTER — Ambulatory Visit (INDEPENDENT_AMBULATORY_CARE_PROVIDER_SITE_OTHER): Admitting: Otolaryngology
